# Patient Record
Sex: Female | Born: 1950 | Race: White | Hispanic: No | Marital: Married | Smoking: Never smoker
Health system: Southern US, Community
[De-identification: ages and names within clinical notes are randomized; demographics above are authoritative.]

## PROBLEM LIST (undated history)

## (undated) DIAGNOSIS — I771 Stricture of artery: Secondary | ICD-10-CM

## (undated) DIAGNOSIS — I774 Celiac artery compression syndrome: Secondary | ICD-10-CM

## (undated) DIAGNOSIS — E039 Hypothyroidism, unspecified: Secondary | ICD-10-CM

## (undated) DIAGNOSIS — M109 Gout, unspecified: Secondary | ICD-10-CM

## (undated) DIAGNOSIS — J449 Chronic obstructive pulmonary disease, unspecified: Secondary | ICD-10-CM

## (undated) DIAGNOSIS — E785 Hyperlipidemia, unspecified: Secondary | ICD-10-CM

## (undated) DIAGNOSIS — I4891 Unspecified atrial fibrillation: Secondary | ICD-10-CM

## (undated) DIAGNOSIS — M1990 Unspecified osteoarthritis, unspecified site: Secondary | ICD-10-CM

## (undated) DIAGNOSIS — B029 Zoster without complications: Secondary | ICD-10-CM

## (undated) DIAGNOSIS — I2510 Atherosclerotic heart disease of native coronary artery without angina pectoris: Secondary | ICD-10-CM

## (undated) DIAGNOSIS — I701 Atherosclerosis of renal artery: Secondary | ICD-10-CM

## (undated) DIAGNOSIS — I350 Nonrheumatic aortic (valve) stenosis: Secondary | ICD-10-CM

## (undated) DIAGNOSIS — N19 Unspecified kidney failure: Secondary | ICD-10-CM

## (undated) DIAGNOSIS — Z87442 Personal history of urinary calculi: Secondary | ICD-10-CM

## (undated) DIAGNOSIS — I499 Cardiac arrhythmia, unspecified: Secondary | ICD-10-CM

## (undated) DIAGNOSIS — R7303 Prediabetes: Secondary | ICD-10-CM

## (undated) DIAGNOSIS — I10 Essential (primary) hypertension: Secondary | ICD-10-CM

## (undated) HISTORY — PX: REPAIR KNEE LIGAMENT: SUR1188

## (undated) HISTORY — PX: PARTIAL HYSTERECTOMY: SHX80

## (undated) HISTORY — DX: Essential (primary) hypertension: I10

## (undated) HISTORY — PX: CATARACT EXTRACTION: SUR2

## (undated) HISTORY — PX: TUBAL LIGATION: SHX77

## (undated) HISTORY — DX: Prediabetes: R73.03

## (undated) HISTORY — PX: ABDOMINAL HYSTERECTOMY: SHX81

## (undated) HISTORY — DX: Stricture of artery: I77.1

## (undated) HISTORY — DX: Atherosclerosis of renal artery: I70.1

## (undated) HISTORY — DX: Hyperlipidemia, unspecified: E78.5

## (undated) HISTORY — PX: CHOLECYSTECTOMY: SHX55

## (undated) HISTORY — PX: DILATION AND CURETTAGE OF UTERUS: SHX78

## (undated) HISTORY — DX: Unspecified osteoarthritis, unspecified site: M19.90

## (undated) HISTORY — PX: ANKLE SURGERY: SHX546

## (undated) HISTORY — PX: BREAST BIOPSY: SHX20

## (undated) HISTORY — DX: Celiac artery compression syndrome: I77.4

## (undated) HISTORY — DX: Gout, unspecified: M10.9

## (undated) HISTORY — DX: Hypothyroidism, unspecified: E03.9

---

## 2000-10-19 ENCOUNTER — Encounter: Payer: Self-pay | Admitting: Internal Medicine

## 2000-10-19 ENCOUNTER — Ambulatory Visit (HOSPITAL_COMMUNITY): Admission: RE | Admit: 2000-10-19 | Discharge: 2000-10-19 | Payer: Self-pay | Admitting: Internal Medicine

## 2000-10-25 ENCOUNTER — Encounter: Payer: Self-pay | Admitting: Internal Medicine

## 2000-10-25 ENCOUNTER — Ambulatory Visit (HOSPITAL_COMMUNITY): Admission: RE | Admit: 2000-10-25 | Discharge: 2000-10-25 | Payer: Self-pay | Admitting: Internal Medicine

## 2001-01-06 ENCOUNTER — Encounter: Payer: Self-pay | Admitting: Internal Medicine

## 2001-01-06 ENCOUNTER — Ambulatory Visit (HOSPITAL_COMMUNITY): Admission: RE | Admit: 2001-01-06 | Discharge: 2001-01-06 | Payer: Self-pay | Admitting: Internal Medicine

## 2001-07-01 ENCOUNTER — Ambulatory Visit (HOSPITAL_COMMUNITY): Admission: RE | Admit: 2001-07-01 | Discharge: 2001-07-01 | Payer: Self-pay | Admitting: Internal Medicine

## 2001-07-01 ENCOUNTER — Encounter: Payer: Self-pay | Admitting: Internal Medicine

## 2001-09-29 ENCOUNTER — Encounter (HOSPITAL_COMMUNITY): Admission: RE | Admit: 2001-09-29 | Discharge: 2001-10-29 | Payer: Self-pay | Admitting: Rheumatology

## 2001-11-09 ENCOUNTER — Ambulatory Visit (HOSPITAL_COMMUNITY): Admission: RE | Admit: 2001-11-09 | Discharge: 2001-11-09 | Payer: Self-pay | Admitting: Internal Medicine

## 2001-11-15 ENCOUNTER — Ambulatory Visit (HOSPITAL_COMMUNITY): Admission: RE | Admit: 2001-11-15 | Discharge: 2001-11-15 | Payer: Self-pay | Admitting: Internal Medicine

## 2001-11-17 ENCOUNTER — Encounter: Payer: Self-pay | Admitting: Internal Medicine

## 2001-11-17 ENCOUNTER — Ambulatory Visit (HOSPITAL_COMMUNITY): Admission: RE | Admit: 2001-11-17 | Discharge: 2001-11-17 | Payer: Self-pay | Admitting: Internal Medicine

## 2004-05-27 ENCOUNTER — Ambulatory Visit (HOSPITAL_COMMUNITY): Admission: RE | Admit: 2004-05-27 | Discharge: 2004-05-27 | Payer: Self-pay | Admitting: Internal Medicine

## 2004-06-10 ENCOUNTER — Ambulatory Visit: Payer: Self-pay | Admitting: Orthopedic Surgery

## 2004-08-10 ENCOUNTER — Inpatient Hospital Stay (HOSPITAL_COMMUNITY): Admission: EM | Admit: 2004-08-10 | Discharge: 2004-08-14 | Payer: Self-pay | Admitting: Emergency Medicine

## 2004-08-10 ENCOUNTER — Ambulatory Visit: Payer: Self-pay | Admitting: Internal Medicine

## 2004-08-22 ENCOUNTER — Ambulatory Visit (HOSPITAL_COMMUNITY): Admission: RE | Admit: 2004-08-22 | Discharge: 2004-08-22 | Payer: Self-pay | Admitting: Internal Medicine

## 2004-11-26 ENCOUNTER — Other Ambulatory Visit: Admission: RE | Admit: 2004-11-26 | Discharge: 2004-11-26 | Payer: Self-pay | Admitting: Dermatology

## 2005-10-21 ENCOUNTER — Ambulatory Visit (HOSPITAL_COMMUNITY): Admission: RE | Admit: 2005-10-21 | Discharge: 2005-10-21 | Payer: Self-pay | Admitting: Family Medicine

## 2005-10-23 ENCOUNTER — Emergency Department (HOSPITAL_COMMUNITY): Admission: EM | Admit: 2005-10-23 | Discharge: 2005-10-23 | Payer: Self-pay | Admitting: Emergency Medicine

## 2005-12-17 ENCOUNTER — Encounter (HOSPITAL_COMMUNITY): Admission: RE | Admit: 2005-12-17 | Discharge: 2006-01-16 | Payer: Self-pay | Admitting: Orthopedic Surgery

## 2006-08-31 ENCOUNTER — Encounter (INDEPENDENT_AMBULATORY_CARE_PROVIDER_SITE_OTHER): Payer: Self-pay | Admitting: Specialist

## 2006-08-31 ENCOUNTER — Other Ambulatory Visit: Admission: RE | Admit: 2006-08-31 | Discharge: 2006-08-31 | Payer: Self-pay | Admitting: Internal Medicine

## 2006-09-09 ENCOUNTER — Ambulatory Visit (HOSPITAL_COMMUNITY): Admission: RE | Admit: 2006-09-09 | Discharge: 2006-09-09 | Payer: Self-pay | Admitting: Family Medicine

## 2009-03-26 ENCOUNTER — Ambulatory Visit (HOSPITAL_COMMUNITY): Admission: RE | Admit: 2009-03-26 | Discharge: 2009-03-26 | Payer: Self-pay | Admitting: Family Medicine

## 2010-05-25 ENCOUNTER — Encounter: Payer: Self-pay | Admitting: Family Medicine

## 2010-05-25 ENCOUNTER — Encounter: Payer: Self-pay | Admitting: Internal Medicine

## 2010-09-20 NOTE — Discharge Summary (Signed)
Kathleen Vance, Kathleen Vance             ACCOUNT NO.:  [id number]   MEDICAL RECORD NO.:  [id number]          PATIENT TYPE:  INP   LOCATION:  A307                          FACILITY:  [facility]   PHYSICIAN:  Bonne Dolores, M.D.    DATE OF BIRTH:  October 24, 1950   DATE OF ADMISSION:  08/10/2004  DATE OF DISCHARGE:  04/12/2006LH                                 DISCHARGE SUMMARY   DISCHARGE DIAGNOSIS:  Fever of unknown origin, probably viral  cytomegalovirus titer (IgM) is positive at one 1.75.  Afebrile x48 hours.  Workup benign.  Echocardiogram pending.   HISTORY OF PRESENT ILLNESS:  For details regarding the admission note.  Briefly, this 60 year old female, with a history of migraines, hypertension,  and status post cholecystectomy, as well as chronic urinary tract  infections, developed an episode of diaphoresis, weakness, and fever.  She  has been treated for recurrent urinary tract infections and has been on  several antibiotics intermittently since.  She has continued to have fever,  left lower quadrant abdominal flank pain, and dry cough.  She had had no  significant headache, nausea and vomiting, or diarrhea (except mild  associated with antibiotics), melena, hematemesis, hematochezia, or full  blown genitourinary symptoms.  In the emergency department she was found to  be febrile with a temperature of 103 degrees.  Hemodynamics were stable.  Laboratories revealed mild pyuria and a white count of 10,000, a clear chest  x-ray, and a CT of the abdomen was essentially unrevealing.   She was admitted with a fever of unknown origin associated with pyuria and  mild diarrhea only.  Of note, cultures of the urine were obtained in the  office the week prior, which were positive for E. coli and sensitive to all  antibiotics.   COURSE IN THE HOSPITAL:  The patient was treated empirically with Rocephin.  Blood cultures were obtained, which were negative.  Viral syndrome was  suspected and CMV titers  were obtained, which were positive with an IgM at  1.75, indicative of ongoing or infection.   Dr. Laural Golden saw the patient, who considered a colonoscopy.  However, her GI  symptoms have improved, she has defervesced, and has been afebrile for 36  hours.  She has been treated with Doxycycline to cover atypicals.   Currently the patient is stable and ready for discharge.  Please note, a  monospot, ANA, and Lyme titers were all negative.   DISCHARGE MEDICATIONS:  Include:  1.  Doxycycline 100 mg b.i.d.  She is to continue:  1.  Diovan.  2.  Neurontin.  3.  Levoxyl at 50 mcg daily.   FOLLOW UP:  She will be followed through expectantly as an outpatient.      MC/MEDQ  D:  08/14/2004  T:  08/14/2004  Job:  [id number]

## 2010-09-20 NOTE — Procedures (Signed)
[name], [name] NO.:  [id number]   MEDICAL RECORD NO.:  [id number]            PATIENT TYPE:   LOCATION:                                 FACILITY:   PHYSICIAN:  Shelva Majestic, M.D.          DATE OF BIRTH:   DATE OF PROCEDURE:  08/13/2004  DATE OF DISCHARGE:                                  ECHOCARDIOGRAM   PROCEDURE:  2-D echo Doppler study.   INDICATIONS:  Ms. Kathleen Vance is a 60 year old patient of Dr. Jari Sportsman  who has a history of hypertension, fever, and pyelonephritis. This study is  performed to evaluate left ventricular function and for hypertensive  cardiovascular disease.   FINDINGS:  1.  Technically, this is an adequate M-mode 2-dimensional and comprehensive      echo Doppler study.  2.  He has mild concentric left ventricular hypertrophy with septal      thickness being upper normal at 1.1 cm and the posterior wall thickness      being 1.2 cm.  Left ventricular end-diastolic and then systolic      dimensions were normal at 4.7 and 2.5 cm, respectively. Left ventricular      systolic function is normal with an estimated ejection fraction at least      55%.  Diastolic primaries appeared normal.  3.  Left atrial dimension was minimally increased at 4.1 cm.  Right atrium      was upper normal in size.  Right ventricle size and function were      normal.  4.  The aortic root dimension was normal at 3.0 cm.  5.  The aortic valve was trileaflet.  There was minimal aortic valvular      sclerosis without stenosis.  There was no aortic regurgitation.  6.  There was mild mitral annular calcification.  There was trivial mitral      regurgitation.  7.  Tricuspid valve appeared structurally normal.  There was a trivial      tricuspid regurgitation.  8.  Pulmonic valve was not well visualized.  9.  There was a very minimal, trivial posterior upper normal to slight      pericardial effusion.  In addition, there was mild apical fat pad versus  minimal apical effusion.  There was no evidence for tamponade      physiology.   IMPRESSION:  1.  Technically this is an adequate echo Doppler study demonstrating a mild      concentric left ventricular hypertrophy with normal left ventricular      systolic and diastolic function.  2.  There is mild aortic valve sclerosis without stenosis.  3.  Mild mitral annular calcification with trivial mitral regurgitation and      tricuspid regurgitation.  There was mild left atrial enlargement.  4.  There is a trivial, posterior, pericardial effusion and apical fat pad      versus minimal apical fusion.      TK/MEDQ  D:  08/13/2004  T:  08/13/2004  Job:  [id number]   cc:   Bonne Dolores, M.D.  [phone]  [address]  [place] [address]  Fax: [phone]

## 2010-09-20 NOTE — Consult Note (Signed)
NAMESHAWNISE, PULSIPHER             ACCOUNT NO.:  [id number]   MEDICAL RECORD NO.:  [id number]          PATIENT TYPE:  INP   LOCATION:  A307                          FACILITY:  [facility]   PHYSICIAN:  Hildred Laser, M.D.    DATE OF BIRTH:  Oct 03, 1950   DATE OF CONSULTATION:  08/12/2004  DATE OF DISCHARGE:                                   CONSULTATION   REQUESTING PHYSICIAN:  Dr. Caron Presume.   REASON FOR CONSULTATION:  Febrile illness, abdominal pain.   HISTORY OF PRESENT ILLNESS:  Ms. Lachman is a 60 year old Caucasian female  who was admitted on August 10, 2004 with almost one month history of high  fever, cough, headache, weakness and some diarrhea.  She was suspected to  have pyelonephritis.  She did have a positive urinalysis and most recent  urine culture has been negative.  She has been on multiple antibiotics for  recent UTIs.  She notes around March 12 she was vacationing in [place] and  she developed severe diaphoresis, fatigue and weakness.  She then developed  a high fever.  About six days later, she went to her primary care physician  and was diagnosed with a urinary tract infection.  She was given a shot of  Rocephin she believe and started on Keflex.  She continued to have severe  weakness.  She noted some abdominal bloating and some nausea without any  emesis.  She began to have watery stools, anywhere from two to six per days.  She also noted some intermittent hematochezia which she describes as bright  red blood.  She denies any mucus.  She denies any problems with  constipation.  She did vomit once and she was admitted, although she feels  this is related to the pain medicine.  On exam by Dr. Caron Presume, she was  found to have some left lower quadrant tenderness.  She has had abdominal  pelvic CT which revealed some descending sigmoid colon diverticulosis and  some small nonspecific lymph nodes.  She also had a CT scan of her chest  which revealed a few nonspecific nodes and  some left lower lobe subsegmental  atelectasis.  She continues to complain of anorexia, high fevers and severe  weakness.  Her T max since admission is 102.2 this morning.  She denies any  GI history, although does describe a history of being hospitalized many  years ago for possible colitis or diverticulitis.  She does not remember.   PAST MEDICAL HISTORY:  1.  Migraine headaches.  2.  Hypertension with left renal artery stenosis.  3.  Chronic urinary tract infections.  4.  Bulging disk which she has been seen by a neurosurgeon at [facility].  5.  Status post cholecystectomy and partial hysterectomy.  6.  Right knee arthroscopy.  7.  Left facet surgery.  8.  Gout.  9.  Hypothyroidism.   MEDICATIONS PRIOR TO ADMISSION:  1.  Diovan HCT 160/125 mg daily.  2.  Levoxyl 50 mcg daily.  3.  Neurontin 300 mg b.i.d. and 600 mg q.h.s.  4.  Lasix 40 mg daily.  5.  Keflex.  6.  Macrodantin.   ALLERGIES:  She states that Cipro and erythromycin and another unknown  antibiotic caused oral blisters, nausea and vomiting in the past.   FAMILY HISTORY:  Mother deceased at age 89 secondary to coronary artery  disease.  Father deceased at age 8 secondary to leukemia.  She has multiple  siblings with history significant for renal failure.  No known family  history of colorectal carcinoma or inflammatory bowel disease.   SOCIAL HISTORY:  Ms. Sula is attempting to obtain disability.  She is in  her second marriage and has been married for nine years.  She has two grown  healthy sons.  She denies any tobacco, alcohol or drug use.   REVIEW OF SYSTEMS:  CONSTITUTIONAL:  Weight has been relatively stable.  She  is complaining of chills and night sweats.  CARDIOVASCULAR:  She denies any  chest pain or palpitations.  PULMONARY:  She reports a nonproductive cough.  Denies any dyspnea or hemoptysis.  ENDOCRINE:  Does have history of  hypothyroidism.  MUSCULOSKELETAL:  She is complaining of left leg  numbness.  She has had chronic low back pain with some bulging disks she believes to  be L3, 4 and 5.  Lots of lower extremity edema and lower extremity pain.  GI:  See HPI.  She denies any heartburn, indigestion, dysphagia or  odynophagia.   PHYSICAL EXAMINATION:  VITAL SIGNS:  Temperature 102.2, pulse 100,  respirations 16, blood pressure 137/96, height 64 inches, weight 214.5  pounds.  GENERAL:  Ms. Feliciano is a 60 year old, obese Caucasian female, who is  alert, oriented, pleasant, cooperative, in no acute distress.  SKIN:  She does have facial flushing and multiple raised brown macules.  No  rash or jaundice.  HEENT:  Sclerae clear, nonicteric.  Oropharynx pink and moist without any  lesions.  NECK:  Supple with no masses or thyromegaly.  CHEST:  Heart regular rate and rhythm.  Normal S1, S2, without any murmurs,  rubs, clicks or gallops.  LUNGS:  Clear to auscultation bilaterally.  ABDOMEN:  Obese, with positive bowel sounds x4.  No bruits auscultated.  Soft, nontender, nondistended.  She does have mild left lower quadrant  tenderness on deep palpation.  Unable to palpate mass or hepatosplenomegaly,  although exam is limited given patient's body habitus.  EXTREMITIES:  Trace lower extremity edema bilaterally.   LABORATORY STUDIES:  WBC 8.4, hemoglobin 10.6, hematocrit 30.9, platelets  252.  Admission hemoglobin is 12.3.  Sed rate elevated at 45.  Calcium 7.3,  sodium 134, potassium 3.9, chloride 102, CO2 26, BUN 10, creatinine 1.4,  glucose 112.  Total bilirubin 1.1, alkaline-phosphatase 57, SGOT 42, SGPT  25, total protein 7, albumin 3.1.  Urinalysis is positive, but urine culture  without growth.  Blood cultures without growth x24 hours.  Strep A is less  than 25.   IMPRESSION:  Ms. Laday is a 60 year old Caucasian female with a one month  history of febrile illness, weakness, fatigue, anorexia, diarrhea, occasional intermittent hematochezia and general malaise.  She has  had  recent urinary tract infections and has been on multiple antibiotics for  this.  Most recent urine cultures in the hospital were negative, although  she was already on antibiotics.  She has had CT of the abdomen, pelvis and  chest which was noncontributory except for extensive diverticulosis and a  few nonspecific lymph nodes.  I would suspect that she has a viral illness  and needs to rule out mononucleosis/CMV/UBV.  Possibly, she could also have  C. difficile given her recent antibiotics for urinary tract infections.  Most likely it is occult diverticulitis or occult malignancy.   RECOMMENDATIONS:  1.  We will check a TSH, monospot, stool for C. difficile, WBC, ova and      parasite and stool culture and sensitivity.  2.  Anemia profile, Lyme titer, CMV and AMA are pending.  3.  We will consider colonoscopy in the near future, possibly tomorrow by      Dr. Melony Overly.   I would like to thank Dr. Caron Presume for allowing [place] to participate in the  care of Ms. Schwer.      KC/MEDQ  D:  08/12/2004  T:  08/12/2004  Job:  [id number]   cc:   Bonne Dolores, M.D.  [address], [place] [address]  Fax: [phone]

## 2010-09-20 NOTE — H&P (Signed)
Kathleen Vance, Kathleen Vance             ACCOUNT NO.:  [id number]   MEDICAL RECORD NO.:  [id number]          PATIENT TYPE:  INP   LOCATION:  A307                          FACILITY:  [facility]   PHYSICIAN:  Bonne Dolores, M.D.    DATE OF BIRTH:  03/18/1951   DATE OF ADMISSION:  08/10/2004  DATE OF DISCHARGE:  LH                                HISTORY & PHYSICAL   CHIEF COMPLAINT:  Headache, fever, cough.   HISTORY OF PRESENT ILLNESS:  This is a 60 year old female with a history of  migraine headache and hypertension and is status post cholecystectomy.  She  also has chronic urinary tract infections, has been treated multiple times  in the past.  Approximately one month ago, the patient developed an episode  of diaphoresis and extreme weakness and fever.  Urinary tract infection has  been diagnosed and she has been on several antibiotics intermittently since.  She has continued to have intermittent fever, left lower quadrant abdominal  and flank pain.  Also associated with this syndrome has been a dry cough.  She has had no headache, nausea, vomiting, or significant diarrhea, melena,  hematemesis, or hematochezia, or full-blown genitourinary symptoms.   The patient presented to the emergency department the day of admission with  ongoing symptoms as described above.  She was found to be febrile with a  temperature of 103 degrees.  Hemodynamics were stable.  Laboratory workup  revealed the presence of mild pyuria, a white count of 10,000, and a clear  chest x-ray as well as CT which was essentially unrevealing.   The patient is admitted with fever of unknown origin associated with pyuria  only.  Of note, cultures obtained in the office by Dr. Gerarda Fraction are  unavailable.  However, she has been on Macrodantin as well as Keflex for the  past week or so.   REVIEW OF SYSTEMS:  Otherwise, negative.  She has had no photophobia, neck  pain, chest pain, shortness of breath, or other significant symptoms.   She  has had a cough as noted which has been nonproductive.  She denies  hemoptysis as well.   CURRENT MEDICATIONS:  Macrodantin and Keflex.  Diovan HCT, Neurontin 300 mg  b.i.d. and 600 mg q.h.s.  Levoxyl (unknown dose 1 daily).   PAST MEDICAL HISTORY:  As noted, also is hypothyroid on replacement therapy.   SOCIAL HISTORY:  Nonsmoker and nondrinker.  She does some traveling but has  not been to the [place].  They spent some time in [place] and [place] as  well as [place] within the past year.  She denies any tick exposure within  the past two years.   FAMILY HISTORY:  Noncontributory.   PHYSICAL EXAMINATION:  A very pleasant, mildly obese female who is alert and  oriented and in no acute distress.  Current vital signs:  Temperature is 100  degrees, blood pressure 117/74, pulse is 84, respirations 20.  HEENT:  Normocephalic, atraumatic.  Pupils are equal.  Ears, nose, and throat are  benign.  Neck is supple without thyromegaly, lymphadenopathy, or bruits.  Lungs are clear.  Heart  sounds are perfectly normal with no murmurs, rubs,  or gallops.  The abdomen reveals some tenderness in the left lower quadrant  and left upper quadrant region.  There is no guarding present.  No masses  are palpable.  There is no rebound tenderness.  Bowel sounds are normal.  Extremities:  No cyanosis, clubbing, or edema.  Neurologic exam is within  normal limits.   LABORATORY DATA:  Pertinent labs as noted.  Repeat labs on April 9 reveals a  white count of 8400 with a normal differential.  Hemoglobin 10.6, hematocrit  30.9.  Med-7 normal except for glucose of 112.  Cultures thus far negative.   ASSESSMENT:  Fever of questionable etiology.  She has had some cough and  pyuria associated with left flank and upper abdominal pain.  CT negative for  diverticulitis and white count normal.  Possible viral syndrome,  inflammatory disorder, tick-borne illness, or occult malignancy.   PLAN:  We will obtain  febrile gluten Lyme titer, rheumatologic screen, and  CT scan of the chest, and continue Rocephin 1 g IV q.24h. pending cultures.  We will follow and treat expectantly.  Note:  Anemia is present and may  require further GI evaluation.  We will follow and tract expectantly.      MC/MEDQ  D:  08/11/2004  T:  08/11/2004  Job:  [id number]

## 2016-05-29 ENCOUNTER — Other Ambulatory Visit (HOSPITAL_COMMUNITY): Payer: Self-pay | Admitting: Family Medicine

## 2016-05-29 DIAGNOSIS — I10 Essential (primary) hypertension: Secondary | ICD-10-CM

## 2016-06-03 ENCOUNTER — Ambulatory Visit (HOSPITAL_COMMUNITY)
Admission: RE | Admit: 2016-06-03 | Discharge: 2016-06-03 | Disposition: A | Discharge status: Home / Self Care | Payer: Medicare Other | Source: Ambulatory Visit | Attending: Family Medicine | Admitting: Family Medicine

## 2016-06-03 DIAGNOSIS — I774 Celiac artery compression syndrome: Secondary | ICD-10-CM | POA: Insufficient documentation

## 2016-06-03 DIAGNOSIS — I701 Atherosclerosis of renal artery: Secondary | ICD-10-CM | POA: Diagnosis present

## 2016-06-03 DIAGNOSIS — I10 Essential (primary) hypertension: Secondary | ICD-10-CM | POA: Insufficient documentation

## 2016-06-03 NOTE — Progress Notes (Signed)
*  PRELIMINARY RESULTS* Vascular Ultrasound Renal Artery Duplex has been completed.  Preliminary findings: Technically limited study due to overlying bowel gas and body habitus. Appears to be no stenosis of Right renal artery. Appears to be 1-59% stenosis of the Left renal artery, upper end of scale.     Landry Mellow, RDMS, RVT  06/03/2016, 9:23 AM

## 2016-06-30 ENCOUNTER — Encounter: Payer: Self-pay | Admitting: Vascular Surgery

## 2016-07-01 ENCOUNTER — Encounter: Payer: Self-pay | Admitting: Vascular Surgery

## 2016-07-04 ENCOUNTER — Encounter: Payer: Self-pay | Admitting: Vascular Surgery

## 2016-07-11 ENCOUNTER — Encounter: Payer: Self-pay | Admitting: Vascular Surgery

## 2016-07-11 ENCOUNTER — Ambulatory Visit (INDEPENDENT_AMBULATORY_CARE_PROVIDER_SITE_OTHER): Payer: Medicare Other | Admitting: Vascular Surgery

## 2016-07-11 VITALS — BP 98/64 | HR 60 | Temp 98.1°F | Resp 20 | Ht 64.0 in | Wt 215.0 lb

## 2016-07-11 DIAGNOSIS — I701 Atherosclerosis of renal artery: Secondary | ICD-10-CM

## 2016-07-11 DIAGNOSIS — I10 Essential (primary) hypertension: Secondary | ICD-10-CM

## 2016-07-11 NOTE — Progress Notes (Signed)
Patient ID: SKI POLICH, female   DOB: [birth year], 66 y.o.   MRN: [id number]  Reason for Consult: New Evaluation (Renal and celiac stenosis,  Pt has malignant hypertension)   Referred by No ref. provider found  Subjective:     HPI:  Kathleen Vance is a 66 y.o. female with history of hypertension and hyperlipidemia as well as now been diagnosed with diabetes. She is sent for evaluation of celiac artery stenosis as well as renal artery stenosis. From celiac standpoint she does not complain of any belly pain recent weight loss or pain with eating. There is also no mention of SMA stenosis on her evaluation. From a renal artery stenosis standpoint she has not had any pulmonary edema although she does have some fluid overload and possibly has CHF. She does have hypertension currently taking 2 medications for that. She is not aware of any renal insufficiency at this time and I do not have any numbers to suggest that she does have clinical renal insufficiency. Walking standpoint she says that the main limitation is her gout flares but otherwise she has not limiting with any claudication. She does not have rest pain or tissue loss. She also has never had a stroke TIA or amaurosis and suggest carotid artery disease.  Past Medical History:  Diagnosis Date  . Arthritis   . 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 Gout   . Hyperlipidemia   . Hypertension   . Hypothyroidism   . Prediabetes   . Renal artery stenosis ([facility])    No family history on file. Past Surgical History:  Procedure Laterality Date  . ANKLE SURGERY Bilateral   . CATARACT EXTRACTION    . CESAREAN SECTION    . CHOLECYSTECTOMY    . PARTIAL HYSTERECTOMY    . REPAIR KNEE LIGAMENT      Short Social History:  Social History  Substance Use Topics  . Smoking status: Never Smoker  . Smokeless tobacco: Never Used  . Alcohol use Yes     Comment: rarely    Allergies  Allergen Reactions  . Ciprofloxacin Anaphylaxis  .  Nitrofurantoin Anaphylaxis  . Metronidazole     "sick"  . Amoxicillin Rash  . Erythromycin Rash    Current Outpatient Prescriptions  Medication Sig Dispense Refill  . aspirin 81 MG tablet Take 81 mg by mouth daily.    . furosemide (LASIX) 40 MG tablet Take 20 mg by mouth daily.     Marland Kitchen gabapentin (NEURONTIN) 300 MG capsule Take 300 mg by mouth 2 (two) times daily.     . hydrALAZINE (APRESOLINE) 10 MG tablet Take 10 mg by mouth 2 (two) times daily.    Marland Kitchen labetalol (NORMODYNE) 200 MG tablet Take 200 mg by mouth 4 (four) times daily.     Marland Kitchen levothyroxine (SYNTHROID, LEVOTHROID) 100 MCG tablet Take 100 mcg by mouth daily before breakfast.    . MethylPREDNISolone (MEDROL, PAK, PO) Take by mouth.    . oxyCODONE-acetaminophen (PERCOCET) 5-325 MG tablet Take by mouth every 6 (six) hours as needed for severe pain.    . pravastatin (PRAVACHOL) 20 MG tablet Take 20 mg by mouth daily.     No current facility-administered medications for this visit.     Review of Systems  Constitutional: Positive for fatigue.  HENT: HENT negative.  Eyes: Eyes negative.  Respiratory: Positive for shortness of breath.  Cardiovascular: Positive for leg swelling.  GI: Gastrointestinal negative.  Musculoskeletal: Musculoskeletal negative.  Skin: Skin negative.  Neurological: Neurological negative. Hematologic: Hematologic/lymphatic negative.  Psychiatric: Psychiatric negative.        Objective:  Objective   Vitals:   07/11/16 1131 07/11/16 1132  BP: 110/64 98/64  Pulse: 60 60  Resp: 20   Temp: 98.1 F (36.7 C)   TempSrc: Oral   SpO2: 95%   Weight: 215 lb (97.5 kg)   Height: 5\' 4"  (1.626 m)    Body mass index is 36.9 kg/m.  Physical Exam  Constitutional: She is oriented to person, place, and time. She appears well-developed.  HENT:  Head: Normocephalic.  Eyes: Pupils are equal, round, and reactive to light.  Neck: Normal range of motion.  Cardiovascular: Normal rate.   Pulses:      Carotid  pulses are 2+ on the right side, and 2+ on the left side.      Radial pulses are 2+ on the right side, and 2+ on the left side.       Femoral pulses are 2+ on the right side, and 2+ on the left side. Pulmonary/Chest: Effort normal.  Abdominal: Soft. She exhibits no mass.  Musculoskeletal: Normal range of motion. She exhibits no edema.  Neurological: She is alert and oriented to person, place, and time. No cranial nerve deficit.  Skin: Skin is warm and dry.  Psychiatric: She has a normal mood and affect. Her behavior is normal. Judgment and thought content normal.    Data:      Assessment/Plan:     Cc-year-old female here for evaluation of celiac artery and renal artery stenosis. From a mesenteric ischemia standpoint she seems asymptomatic. No recent weight loss or pain with eating. From renal artery stenosis standpoint she does have 1-59% left renal artery stenosis with a peak systolic velocity that is less than 250. She does have elevated resistive indices in her bilateral kidneys which are also somewhat small at 9 cm suggesting that she has medical renal disease. At this time I would not recommend renal artery stenting to her given the absence of flash pulmonary edema and worsening renal insufficiency or stenosis to suggest the cause of her hypertension. We will have her follow-up in 1 year with repeat renal duplex.     Kathleen Sandy MD [facility]

## 2016-07-17 NOTE — Addendum Note (Signed)
Addended by: Lianne Cure A on: 07/17/2016 09:55 AM   Modules accepted: Orders

## 2016-08-22 ENCOUNTER — Ambulatory Visit (INDEPENDENT_AMBULATORY_CARE_PROVIDER_SITE_OTHER): Payer: Medicare Other | Admitting: Cardiovascular Disease

## 2016-08-22 ENCOUNTER — Encounter: Payer: Self-pay | Admitting: Cardiovascular Disease

## 2016-08-22 VITALS — BP 167/90 | HR 59 | Ht 64.0 in | Wt 221.0 lb

## 2016-08-22 DIAGNOSIS — I5032 Chronic diastolic (congestive) heart failure: Secondary | ICD-10-CM

## 2016-08-22 DIAGNOSIS — I350 Nonrheumatic aortic (valve) stenosis: Secondary | ICD-10-CM | POA: Diagnosis not present

## 2016-08-22 DIAGNOSIS — I10 Essential (primary) hypertension: Secondary | ICD-10-CM

## 2016-08-22 NOTE — Patient Instructions (Signed)
Medication Instructions:  Your physician recommends that you continue on your current medications as directed. Please refer to the Current Medication list given to you today.  Labwork: NONE  Testing/Procedures: NONE  Follow-Up: Your physician wants you to follow-up in: 6 MONTHS WITH DR. KONESWARAN. You will receive a reminder letter in the mail two months in advance. If you don't receive a letter, please call our office to schedule the follow-up appointment.  Any Other Special Instructions Will Be Listed Below (If Applicable).  If you need a refill on your cardiac medications before your next appointment, please call your pharmacy. 

## 2016-08-22 NOTE — Progress Notes (Signed)
CARDIOLOGY CONSULT NOTE  Patient ID: Kathleen Vance MRN: [id number] DOB/AGE: [birth year] 66 y.o.  Admit date: (Not on file) Primary Physician: Sandi Mealy, MD Referring Physician: Lorra Hals  Reason for Consultation: malignant hypertension and valvular heart disease  HPI: Kathleen Vance is a 66 y.o. female who is being seen today for the evaluation of malignant hypertension and valvular heart disease at the request of Sandi Mealy, MD.   She was recently evaluated by vascular surgery for celiac and renal artery stenosis. Renal Dopplers are going to be repeated in a year. She has 1-59% left renal artery stenosis.  Echocardiogram in 06/2016 at an outside facility showed normal LV systolic function, EF 26-20%, grade 3 diastolic dysfunction, and moderate aortic stenosis (although mean gradient reported as 10 mmHg).   Nuclear stress test 10/15/15 showed no ischemia or scar.  ECG performed in the office today which I ordered and personally interpreted demonstrates normal sinus rhythm with LVH and repolarization abnormalities.  She is seeing a nephrologist (Dr. Mercy Moore with [facility]) in [place] for BP management.  She denies chest pain and shortness of breath as well as orthopnea, leg swelling, and PND.  Says Lasix 20 mg daily has helped to keep leg edema controlled.  She keeps a detailed BP log and I reviewed these readings, with several SBP's > 200 mmHg.  When BP got down to 115/72, she became lightheaded and dizzy.  Was noticed to have malignant HTN in 06/2016.    Allergies  Allergen Reactions  . Ciprofloxacin Anaphylaxis  . Nitrofurantoin Anaphylaxis  . Metronidazole     "sick"  . Amoxicillin Rash  . Erythromycin Rash    Current Outpatient Prescriptions  Medication Sig Dispense Refill  . aspirin 81 MG tablet Take 81 mg by mouth daily.    . furosemide (LASIX) 40 MG tablet Take 20 mg by mouth daily.     Marland Kitchen gabapentin (NEURONTIN) 300  MG capsule Take 300 mg by mouth 2 (two) times daily.     . hydrALAZINE (APRESOLINE) 10 MG tablet Take 10 mg by mouth 2 (two) times daily.    Marland Kitchen labetalol (NORMODYNE) 200 MG tablet Take 400 mg by mouth 2 (two) times daily.     Marland Kitchen levothyroxine (SYNTHROID, LEVOTHROID) 100 MCG tablet Take 100 mcg by mouth daily before breakfast.    . MethylPREDNISolone (MEDROL, PAK, PO) Take 1 tablet by mouth as needed.     Marland Kitchen oxyCODONE-acetaminophen (PERCOCET) 5-325 MG tablet Take 1 tablet by mouth every 6 (six) hours as needed for severe pain.     . pravastatin (PRAVACHOL) 20 MG tablet Take 20 mg by mouth daily.     No current facility-administered medications for this visit.     Past Medical History:  Diagnosis Date  . Arthritis   . Celiac artery stenosis ([place])   . Gout   . Hyperlipidemia   . Hypertension   . Hypothyroidism   . Prediabetes   . Renal artery stenosis [facility])     Past Surgical History:  Procedure Laterality Date  . ANKLE SURGERY Bilateral   . CATARACT EXTRACTION    . CESAREAN SECTION    . CHOLECYSTECTOMY    . PARTIAL HYSTERECTOMY    . REPAIR KNEE LIGAMENT      Social History   Social History  . Marital status: Married    Spouse name: N/A  . Number of children: N/A  . Years of education: N/A   Occupational History  .  Not on file.   Social History Main Topics  . Smoking status: Never Smoker  . Smokeless tobacco: Never Used  . Alcohol use Yes     Comment: rarely  . Drug use: Unknown  . Sexual activity: Not on file   Other Topics Concern  . Not on file   Social History Narrative  . No narrative on file     No family history of premature CAD in 1st degree relatives.  Current Meds  Medication Sig  . aspirin 81 MG tablet Take 81 mg by mouth daily.  . furosemide (LASIX) 40 MG tablet Take 20 mg by mouth daily.   Marland Kitchen gabapentin (NEURONTIN) 300 MG capsule Take 300 mg by mouth 2 (two) times daily.   . hydrALAZINE (APRESOLINE) 10 MG tablet Take 10 mg by mouth 2 (two) times  daily.  Marland Kitchen labetalol (NORMODYNE) 200 MG tablet Take 400 mg by mouth 2 (two) times daily.   Marland Kitchen levothyroxine (SYNTHROID, LEVOTHROID) 100 MCG tablet Take 100 mcg by mouth daily before breakfast.  . MethylPREDNISolone (MEDROL, PAK, PO) Take 1 tablet by mouth as needed.   Marland Kitchen oxyCODONE-acetaminophen (PERCOCET) 5-325 MG tablet Take 1 tablet by mouth every 6 (six) hours as needed for severe pain.   . pravastatin (PRAVACHOL) 20 MG tablet Take 20 mg by mouth daily.      Review of systems complete and found to be negative unless listed above in HPI    Physical exam Blood pressure (!) 167/90, pulse (!) 59, height 5\' 4"  (1.626 m), weight 221 lb (100.2 kg). General: NAD Neck: No JVD, no thyromegaly or thyroid nodule.  Lungs: Clear to auscultation bilaterally with normal respiratory effort. CV: Nondisplaced PMI. Regular rate and rhythm, normal S1/S2, no E3/O1, 1/6 systolic murmur over RUSB.  No peripheral edema.  No carotid bruit.    Abdomen: Soft, nontender, obese, no distention.  Skin: Intact without lesions or rashes.  Neurologic: Alert and oriented x 3.  Psych: Normal affect. Extremities: No clubbing or cyanosis.  HEENT: Normal.   ECG: Most recent ECG reviewed.   Labs: No results found for: K, BUN, CREATININE, ALT, TSH, HGB   Lipids: No results found for: LDLCALC, LDLDIRECT, CHOL, TRIG, HDL      ASSESSMENT AND PLAN:  1. Aortic stenosis: Appears mild to moderate with mean gradient of 10 mmHg. Will repeat echocardiogram in 1.5-2 years.  2. Chronic diastolic heart failure: Aim to control BP. Euvolemic on Lasix.  3. Malignant HTN: Remains elevated. She is seeing a nephrologist (Dr. Mercy Moore with [facility]) in [place] for BP management. Warned about dangers of MI and CVA. Currently on labetalol and low dose hydralazine.  Disposition: Follow up in 6 months  Signed: Kate Sable, M.D., F.A.C.C.  08/22/2016, 3:21 PM

## 2016-09-11 ENCOUNTER — Encounter: Payer: Self-pay | Admitting: Cardiovascular Disease

## 2016-09-11 ENCOUNTER — Encounter: Payer: Self-pay | Admitting: *Deleted

## 2016-09-11 ENCOUNTER — Ambulatory Visit (INDEPENDENT_AMBULATORY_CARE_PROVIDER_SITE_OTHER): Payer: Medicare Other | Admitting: Cardiovascular Disease

## 2016-09-11 VITALS — BP 144/100 | HR 59 | Ht 64.0 in | Wt 224.0 lb

## 2016-09-11 DIAGNOSIS — R05 Cough: Secondary | ICD-10-CM | POA: Diagnosis not present

## 2016-09-11 DIAGNOSIS — J988 Other specified respiratory disorders: Secondary | ICD-10-CM | POA: Diagnosis not present

## 2016-09-11 DIAGNOSIS — B9789 Other viral agents as the cause of diseases classified elsewhere: Secondary | ICD-10-CM

## 2016-09-11 DIAGNOSIS — I5033 Acute on chronic diastolic (congestive) heart failure: Secondary | ICD-10-CM

## 2016-09-11 DIAGNOSIS — Z9289 Personal history of other medical treatment: Secondary | ICD-10-CM

## 2016-09-11 DIAGNOSIS — I350 Nonrheumatic aortic (valve) stenosis: Secondary | ICD-10-CM | POA: Diagnosis not present

## 2016-09-11 DIAGNOSIS — R059 Cough, unspecified: Secondary | ICD-10-CM

## 2016-09-11 DIAGNOSIS — I10 Essential (primary) hypertension: Secondary | ICD-10-CM

## 2016-09-11 MED ORDER — PREDNISONE 5 MG PO TABS: 5.0000 mg | ORAL_TABLET | Freq: Every day | ORAL | 0 refills | Status: DC

## 2016-09-11 MED ORDER — HYDRALAZINE HCL 25 MG PO TABS: 25.0000 mg | ORAL_TABLET | Freq: Three times a day (TID) | ORAL | 6 refills | Status: DC

## 2016-09-11 MED ORDER — FUROSEMIDE 40 MG PO TABS: 40.0000 mg | ORAL_TABLET | Freq: Two times a day (BID) | ORAL | 6 refills | Status: DC

## 2016-09-11 NOTE — Progress Notes (Signed)
SUBJECTIVE: The patient presents for premature follow-up. I saw her on 08/22/16. She has mild to moderate aortic stenosis, chronic diastolic heart failure, and malignant hypertension. She also has renal artery stenosis.  She was apparently recently evaluated at an urgent care in [place], [place] on 09/03/16 and was given a diagnosis of pneumonia and congestive heart failure.   I personally reviewed all relevant documentation, labs, studies.   I personally interpreted an ECG tracing which demonstrates sinus rhythm with LVH and repolarization abnormalities and undulating artifact.   White blood cells 10.8, hemoglobin 11.1, platelets 222, sodium 142, potassium 3.7, BUN 20, creatinine 1.8, BNP 138, troponin was normal, d-dimer mildly elevated at 0.74.   She was given ceftriaxone, cefuroxime, and doxycycline.   Blood pressure 165/94, oxygen saturations 95% on room air.   She was told to follow-up with cardiology.  Weight is up today 3 pounds from 4/20.  She was told by urgent care that she had a heart related problem. She tells me she had a clear chest x-ray and a "normal lung scan "at [facility]. She was started on antibiotics on May 2. She has been having fevers, cough, and white mucus production. She said her fevers of gone as high as 101.4. She was evaluated at this urgent care for 3 consecutive days. She was told to increase her Lasix to 40 mg and she did so for 3 days. She has difficulty sleeping at night. She said her legs are a bit more swollen.   Review of Systems: As per "subjective", otherwise negative.  Allergies  Allergen Reactions  . Ciprofloxacin Anaphylaxis  . Nitrofurantoin Anaphylaxis  . Metronidazole     "sick"  . Amoxicillin Rash  . Erythromycin Rash    Current Outpatient Prescriptions  Medication Sig Dispense Refill  . aspirin 81 MG tablet Take 81 mg by mouth daily.    . cefUROXime (CEFTIN) 500 MG tablet Take 500 mg by mouth 2 (two) times daily  with a meal.    . doxycycline (VIBRA-TABS) 100 MG tablet Take 100 mg by mouth 2 (two) times daily.    . furosemide (LASIX) 40 MG tablet Take 20 mg by mouth daily.     Marland Kitchen gabapentin (NEURONTIN) 300 MG capsule Take 300 mg by mouth 2 (two) times daily.     . hydrALAZINE (APRESOLINE) 10 MG tablet Take 10 mg by mouth 2 (two) times daily.    Marland Kitchen labetalol (NORMODYNE) 200 MG tablet Take 400 mg by mouth 2 (two) times daily.     Marland Kitchen levothyroxine (SYNTHROID, LEVOTHROID) 100 MCG tablet Take 100 mcg by mouth daily before breakfast.    . MethylPREDNISolone (MEDROL, PAK, PO) Take 1 tablet by mouth as needed.     Marland Kitchen oxyCODONE-acetaminophen (PERCOCET) 5-325 MG tablet Take 1 tablet by mouth every 6 (six) hours as needed for severe pain.     . pravastatin (PRAVACHOL) 20 MG tablet Take 20 mg by mouth daily.     No current facility-administered medications for this visit.     Past Medical History:  Diagnosis Date  . Arthritis   . Celiac artery stenosis ([place])   . Gout   . Hyperlipidemia   . Hypertension   . Hypothyroidism   . Prediabetes   . Renal artery stenosis [facility])     Past Surgical History:  Procedure Laterality Date  . ANKLE SURGERY Bilateral   . CATARACT EXTRACTION    . CESAREAN SECTION    . CHOLECYSTECTOMY    .  PARTIAL HYSTERECTOMY    . REPAIR KNEE LIGAMENT      Social History   Social History  . Marital status: Married    Spouse name: N/A  . Number of children: N/A  . Years of education: N/A   Occupational History  . Not on file.   Social History Main Topics  . Smoking status: Never Smoker  . Smokeless tobacco: Never Used  . Alcohol use Yes     Comment: rarely  . Drug use: Unknown  . Sexual activity: Not on file   Other Topics Concern  . Not on file   Social History Narrative  . No narrative on file     Vitals:   09/11/16 0822  BP: (!) 144/100  Pulse: (!) 59  SpO2: 98%  Weight: 224 lb (101.6 kg)  Height: 5\' 4"  (1.626 m)    Wt Readings from Last 3 Encounters:    09/11/16 224 lb (101.6 kg)  08/22/16 221 lb (100.2 kg)  07/11/16 215 lb (97.5 kg)     PHYSICAL EXAM General: NAD HEENT: Normal. Neck: No JVD, no thyromegaly. Lungs: Faint expiratory wheezes b/l, no rales CV: Nondisplaced PMI.  Regular rate and rhythm, normal S1/S2, no W8/E3, 1/6 systolic murmur over RUSB. Trace-1+ b/l periankle edema.  Abdomen: Soft, nontender, obese.  Neurologic: Alert and oriented.  Psych: Normal affect. Skin: Normal. Musculoskeletal: No gross deformities.    ECG: Most recent ECG reviewed.   Labs: No results found for: K, BUN, CREATININE, ALT, TSH, HGB   Lipids: No results found for: LDLCALC, LDLDIRECT, CHOL, TRIG, HDL     ASSESSMENT AND PLAN:  1. Upper respiratory tract infection: This appears to be possibly a virally mediated phenomenon. She has significant wheezing. I will prescribe prednisone 5 mg daily for 5 days. I told her to finish her antibiotic course as it would be unwise to prematurely discontinue it. I told her to see her PCP. She says she has an appointment in 2 weeks. This likely triggered increasing leg edema in addition to her significantly elevated blood pressure.  2. Aortic stenosis: Appears mild to moderate with mean gradient of 10 mmHg. Will repeat echocardiogram in 1.5-2 years.  3. Acute on chronic diastolic heart failure: I believe she has an viral respiratory infection which may have led to some increasing leg edema. Weight up 3 lbs. Prednisone may also lead to some increased fluid retention. I will increase Lasix to 40 mg twice daily for 3 days and then maintain her on 40 mg daily. I will also aim to control blood pressure.  3. Malignant HTN: This remains elevated. I will increase hydralazine to 25 mg three times daily. She had been taking 10 mg twice daily. Prednisone may also exacerbate BP. She is seeing a nephrologist (Dr. Mercy Moore with [facility]) in [place] for BP management.    Disposition: Follow up 6  months  Time spent: 40 minutes, of which greater than 50% was spent reviewing symptoms, relevant blood tests and studies, and discussing management plan with the patient.   Kate Sable, M.D., F.A.C.C.

## 2016-09-11 NOTE — Patient Instructions (Addendum)
Medication Instructions:   Your physician has recommended you make the following change in your medication:   Increase furosemide to 40 mg twice daily for 3 days, then; reduce to 40 mg daily.  Increase hydralazine to 25 mg three times daily.  Start prednisone 5 mg daily for 5 days then stop.  Continue all other medications the same.  Labwork: NONE  Testing/Procedures: NONE  Follow-Up:  Your physician recommends that you schedule a follow-up appointment in: 6 months. You will receive a reminder letter in the mail in about 4 months reminding you to call and schedule your appointment. If you don't receive this letter, please contact our office.  Any Other Special Instructions Will Be Listed Below (If Applicable).  Please follow up with your family doctor.  If you need a refill on your cardiac medications before your next appointment, please call your pharmacy.

## 2016-10-17 DIAGNOSIS — I10 Essential (primary) hypertension: Secondary | ICD-10-CM | POA: Diagnosis not present

## 2016-10-21 DIAGNOSIS — N184 Chronic kidney disease, stage 4 (severe): Secondary | ICD-10-CM | POA: Diagnosis not present

## 2016-10-21 DIAGNOSIS — M1A9XX0 Chronic gout, unspecified, without tophus (tophi): Secondary | ICD-10-CM | POA: Diagnosis not present

## 2016-10-21 DIAGNOSIS — I5040 Unspecified combined systolic (congestive) and diastolic (congestive) heart failure: Secondary | ICD-10-CM | POA: Diagnosis not present

## 2016-10-21 DIAGNOSIS — Z0001 Encounter for general adult medical examination with abnormal findings: Secondary | ICD-10-CM | POA: Diagnosis not present

## 2016-10-21 DIAGNOSIS — I10 Essential (primary) hypertension: Secondary | ICD-10-CM | POA: Diagnosis not present

## 2016-11-12 DIAGNOSIS — M79645 Pain in left finger(s): Secondary | ICD-10-CM | POA: Diagnosis not present

## 2016-11-19 DIAGNOSIS — I10 Essential (primary) hypertension: Secondary | ICD-10-CM | POA: Diagnosis not present

## 2016-11-19 DIAGNOSIS — Z1159 Encounter for screening for other viral diseases: Secondary | ICD-10-CM | POA: Diagnosis not present

## 2016-12-03 DIAGNOSIS — I129 Hypertensive chronic kidney disease with stage 1 through stage 4 chronic kidney disease, or unspecified chronic kidney disease: Secondary | ICD-10-CM | POA: Diagnosis not present

## 2016-12-03 DIAGNOSIS — N184 Chronic kidney disease, stage 4 (severe): Secondary | ICD-10-CM | POA: Diagnosis not present

## 2016-12-03 DIAGNOSIS — E039 Hypothyroidism, unspecified: Secondary | ICD-10-CM | POA: Diagnosis not present

## 2016-12-03 DIAGNOSIS — E782 Mixed hyperlipidemia: Secondary | ICD-10-CM | POA: Diagnosis not present

## 2016-12-03 DIAGNOSIS — M1A9XX0 Chronic gout, unspecified, without tophus (tophi): Secondary | ICD-10-CM | POA: Diagnosis not present

## 2016-12-03 DIAGNOSIS — I5040 Unspecified combined systolic (congestive) and diastolic (congestive) heart failure: Secondary | ICD-10-CM | POA: Diagnosis not present

## 2016-12-11 DIAGNOSIS — N183 Chronic kidney disease, stage 3 (moderate): Secondary | ICD-10-CM | POA: Diagnosis not present

## 2016-12-11 DIAGNOSIS — E119 Type 2 diabetes mellitus without complications: Secondary | ICD-10-CM | POA: Diagnosis not present

## 2016-12-11 DIAGNOSIS — D631 Anemia in chronic kidney disease: Secondary | ICD-10-CM | POA: Diagnosis not present

## 2016-12-11 DIAGNOSIS — M109 Gout, unspecified: Secondary | ICD-10-CM | POA: Diagnosis not present

## 2016-12-11 DIAGNOSIS — I10 Essential (primary) hypertension: Secondary | ICD-10-CM | POA: Diagnosis not present

## 2016-12-17 DIAGNOSIS — D631 Anemia in chronic kidney disease: Secondary | ICD-10-CM | POA: Diagnosis not present

## 2016-12-17 DIAGNOSIS — I10 Essential (primary) hypertension: Secondary | ICD-10-CM | POA: Diagnosis not present

## 2016-12-31 DIAGNOSIS — N183 Chronic kidney disease, stage 3 (moderate): Secondary | ICD-10-CM | POA: Diagnosis not present

## 2017-02-03 DIAGNOSIS — N183 Chronic kidney disease, stage 3 (moderate): Secondary | ICD-10-CM | POA: Diagnosis not present

## 2017-02-12 DIAGNOSIS — I10 Essential (primary) hypertension: Secondary | ICD-10-CM | POA: Diagnosis not present

## 2017-02-12 DIAGNOSIS — N2581 Secondary hyperparathyroidism of renal origin: Secondary | ICD-10-CM | POA: Diagnosis not present

## 2017-02-12 DIAGNOSIS — N183 Chronic kidney disease, stage 3 (moderate): Secondary | ICD-10-CM | POA: Diagnosis not present

## 2017-02-12 DIAGNOSIS — D631 Anemia in chronic kidney disease: Secondary | ICD-10-CM | POA: Diagnosis not present

## 2017-03-10 ENCOUNTER — Encounter: Payer: Self-pay | Admitting: Cardiovascular Disease

## 2017-03-10 ENCOUNTER — Ambulatory Visit: Payer: Medicare Other | Admitting: Cardiovascular Disease

## 2017-03-10 VITALS — BP 170/88 | HR 58 | Ht 64.0 in | Wt 216.0 lb

## 2017-03-10 DIAGNOSIS — I5032 Chronic diastolic (congestive) heart failure: Secondary | ICD-10-CM

## 2017-03-10 DIAGNOSIS — I350 Nonrheumatic aortic (valve) stenosis: Secondary | ICD-10-CM

## 2017-03-10 DIAGNOSIS — I10 Essential (primary) hypertension: Secondary | ICD-10-CM

## 2017-03-10 MED ORDER — HYDRALAZINE HCL 50 MG PO TABS
50.0000 mg | ORAL_TABLET | Freq: Three times a day (TID) | ORAL | 6 refills | Status: DC
Start: 2017-03-10 — End: 2017-07-31

## 2017-03-10 NOTE — Progress Notes (Signed)
SUBJECTIVE: The patient presents for routine follow-up.  She has mild to moderate aortic stenosis, chronic diastolic heart failure, malignant hypertension, and renal artery stenosis.  The patient denies any symptoms of chest pain, palpitations, shortness of breath, lightheadedness, dizziness, leg swelling, orthopnea, PND, and syncope.  She is presently on prednisone for a right foot gout flare.  Blood pressures are 197/107.  She is apparently on a irbesartan but does not remember the dose.  Blood pressure in our office today is 170/88.  When her blood pressure normalizes, she feels fatigued.   Review of Systems: As per "subjective", otherwise negative.  Allergies  Allergen Reactions  . Ciprofloxacin Anaphylaxis  . Nitrofurantoin Anaphylaxis  . Metronidazole     "sick"  . Amoxicillin Rash  . Erythromycin Rash    Current Outpatient Medications  Medication Sig Dispense Refill  . allopurinol (ZYLOPRIM) 300 MG tablet Take 300 mg daily by mouth.    Marland Kitchen aspirin 81 MG tablet Take 81 mg by mouth daily.    . cefUROXime (CEFTIN) 500 MG tablet Take 500 mg by mouth 2 (two) times daily with a meal.    . doxycycline (VIBRA-TABS) 100 MG tablet Take 100 mg by mouth 2 (two) times daily.    . furosemide (LASIX) 40 MG tablet Take 1 tablet (40 mg total) by mouth 2 (two) times daily. For 3 days; then, reduce to 40 mg daily 43 tablet 6  . gabapentin (NEURONTIN) 300 MG capsule Take 300 mg by mouth 2 (two) times daily.     . hydrALAZINE (APRESOLINE) 25 MG tablet Take 1 tablet (25 mg total) by mouth 3 (three) times daily. 90 tablet 6  . labetalol (NORMODYNE) 200 MG tablet Take 400 mg by mouth 2 (two) times daily.     Marland Kitchen levothyroxine (SYNTHROID, LEVOTHROID) 100 MCG tablet Take 100 mcg by mouth daily before breakfast.    . MethylPREDNISolone (MEDROL, PAK, PO) Take 1 tablet by mouth as needed.     Marland Kitchen oxyCODONE-acetaminophen (PERCOCET) 5-325 MG tablet Take 1 tablet by mouth every 6 (six) hours as needed  for severe pain.     . pravastatin (PRAVACHOL) 20 MG tablet Take 20 mg by mouth daily.    . predniSONE (DELTASONE) 5 MG tablet Take 1 tablet (5 mg total) by mouth daily with breakfast. For 5 days then stop 5 tablet 0  . Vitamin D, Ergocalciferol, (DRISDOL) 50000 units CAPS capsule Take 50,000 Units every 7 (seven) days by mouth.     No current facility-administered medications for this visit.     Past Medical History:  Diagnosis Date  . Arthritis   . Celiac artery stenosis ([place])   . Gout   . Hyperlipidemia   . Hypertension   . Hypothyroidism   . Prediabetes   . Renal artery stenosis [facility])     Past Surgical History:  Procedure Laterality Date  . ANKLE SURGERY Bilateral   . CATARACT EXTRACTION    . CESAREAN SECTION    . CHOLECYSTECTOMY    . PARTIAL HYSTERECTOMY    . REPAIR KNEE LIGAMENT      Social History   Socioeconomic History  . Marital status: Married    Spouse name: Not on file  . Number of children: Not on file  . Years of education: Not on file  . Highest education level: Not on file  Social Needs  . Financial resource strain: Not on file  . Food insecurity - worry: Not on file  . Food  insecurity - inability: Not on file  . Transportation needs - medical: Not on file  . Transportation needs - non-medical: Not on file  Occupational History  . Not on file  Tobacco Use  . Smoking status: Never Smoker  . Smokeless tobacco: Never Used  Substance and Sexual Activity  . Alcohol use: Yes    Comment: rarely  . Drug use: Not on file  . Sexual activity: Not on file  Other Topics Concern  . Not on file  Social History Narrative  . Not on file     Vitals:   03/10/17 1116  BP: (!) 170/88  Pulse: (!) 58  SpO2: 97%  Weight: 216 lb (98 kg)  Height: 5\' 4"  (1.626 m)    Wt Readings from Last 3 Encounters:  03/10/17 216 lb (98 kg)  09/11/16 224 lb (101.6 kg)  08/22/16 221 lb (100.2 kg)     PHYSICAL EXAM General: NAD HEENT: Normal. Neck: No JVD, no  thyromegaly. Lungs: Faint expiratory wheezes b/l, no rales CV: Nondisplaced PMI.  Regular rate and rhythm, normal S1/S2, no V0/J5, 1/6 systolicmurmur over RUSB. Trace-1+ b/l periankle edema.  Abdomen: Soft, nontender, obese.   Neurologic: Alert and oriented.  Psych: Normal affect. Skin: Normal. Musculoskeletal: No gross deformities.    ECG: Most recent ECG reviewed.   Labs: No results found for: K, BUN, CREATININE, ALT, TSH, HGB   Lipids: No results found for: LDLCALC, LDLDIRECT, CHOL, TRIG, HDL     ASSESSMENT AND PLAN: 1.  Malignant hypertension: Markedly elevated.  I will increase hydralazine to 50 mg three times daily. She is also seeing a nephrologist (Dr. Posey Pronto with [facility]) in [place] for BP management. She is on labetalol and irbesartan as well.  2. Aortic stenosis: Appears mild to moderate with mean gradient of 10 mmHg. Will repeat echocardiogram in 1.5-2 years.  3.  Chronic diastolic heart failure: Euvolemic on Lasix 40 mg daily.  I will aim to control blood pressure.  Weight down 8 pounds since her last visit with me on 09/11/16.    Disposition: Follow up 6 months.   Kate Sable, M.D., F.A.C.C.

## 2017-03-10 NOTE — Patient Instructions (Addendum)
Medication Instructions:   Increase Hydralazine to 50mg  three times per day.  Please call the office back with the dose of your Ibersartan.  Continue all other medications.    Labwork: none  Testing/Procedures: none  Follow-Up: Your physician wants you to follow up in: 6 months.  You will receive a reminder letter in the mail one-two months in advance.  If you don't receive a letter, please call our office to schedule the follow up appointment   Any Other Special Instructions Will Be Listed Below (If Applicable).  If you need a refill on your cardiac medications before your next appointment, please call your pharmacy.

## 2017-03-12 MED ORDER — IRBESARTAN 300 MG PO TABS: 300.0000 mg | ORAL_TABLET | Freq: Every day | ORAL | Status: DC

## 2017-03-12 MED ORDER — FENOFIBRATE 160 MG PO TABS: 160.0000 mg | ORAL_TABLET | Freq: Every day | ORAL | Status: DC

## 2017-03-12 NOTE — Addendum Note (Signed)
Addended by: Laurine Blazer on: 03/12/2017 01:29 PM   Modules accepted: Orders

## 2017-04-07 DIAGNOSIS — E039 Hypothyroidism, unspecified: Secondary | ICD-10-CM | POA: Diagnosis not present

## 2017-04-07 DIAGNOSIS — E782 Mixed hyperlipidemia: Secondary | ICD-10-CM | POA: Diagnosis not present

## 2017-04-07 DIAGNOSIS — I10 Essential (primary) hypertension: Secondary | ICD-10-CM | POA: Diagnosis not present

## 2017-04-09 DIAGNOSIS — D72829 Elevated white blood cell count, unspecified: Secondary | ICD-10-CM | POA: Diagnosis not present

## 2017-04-09 DIAGNOSIS — M1000 Idiopathic gout, unspecified site: Secondary | ICD-10-CM | POA: Diagnosis not present

## 2017-07-06 DIAGNOSIS — E039 Hypothyroidism, unspecified: Secondary | ICD-10-CM | POA: Diagnosis not present

## 2017-07-06 DIAGNOSIS — E782 Mixed hyperlipidemia: Secondary | ICD-10-CM | POA: Diagnosis not present

## 2017-07-06 DIAGNOSIS — M1000 Idiopathic gout, unspecified site: Secondary | ICD-10-CM | POA: Diagnosis not present

## 2017-07-06 DIAGNOSIS — N390 Urinary tract infection, site not specified: Secondary | ICD-10-CM | POA: Diagnosis not present

## 2017-07-06 DIAGNOSIS — D72829 Elevated white blood cell count, unspecified: Secondary | ICD-10-CM | POA: Diagnosis not present

## 2017-07-06 DIAGNOSIS — N184 Chronic kidney disease, stage 4 (severe): Secondary | ICD-10-CM | POA: Diagnosis not present

## 2017-07-17 ENCOUNTER — Encounter (HOSPITAL_COMMUNITY): Payer: Self-pay

## 2017-07-17 ENCOUNTER — Ambulatory Visit: Payer: Medicare Other | Admitting: Family

## 2017-07-17 ENCOUNTER — Encounter (HOSPITAL_COMMUNITY): Payer: Medicare Other

## 2017-07-21 ENCOUNTER — Emergency Department (HOSPITAL_COMMUNITY): Payer: Medicare Other

## 2017-07-21 ENCOUNTER — Observation Stay (HOSPITAL_COMMUNITY)
Admission: EM | Admit: 2017-07-21 | Discharge: 2017-07-23 | Disposition: A | Discharge status: Home / Self Care | Payer: Medicare Other | Attending: Internal Medicine | Admitting: Internal Medicine

## 2017-07-21 ENCOUNTER — Other Ambulatory Visit: Payer: Self-pay

## 2017-07-21 ENCOUNTER — Encounter (HOSPITAL_COMMUNITY): Payer: Self-pay | Admitting: Emergency Medicine

## 2017-07-21 DIAGNOSIS — R079 Chest pain, unspecified: Secondary | ICD-10-CM | POA: Diagnosis present

## 2017-07-21 DIAGNOSIS — N390 Urinary tract infection, site not specified: Secondary | ICD-10-CM | POA: Diagnosis present

## 2017-07-21 DIAGNOSIS — R7989 Other specified abnormal findings of blood chemistry: Secondary | ICD-10-CM | POA: Diagnosis not present

## 2017-07-21 DIAGNOSIS — E785 Hyperlipidemia, unspecified: Secondary | ICD-10-CM | POA: Diagnosis present

## 2017-07-21 DIAGNOSIS — Z79899 Other long term (current) drug therapy: Secondary | ICD-10-CM | POA: Insufficient documentation

## 2017-07-21 DIAGNOSIS — I10 Essential (primary) hypertension: Secondary | ICD-10-CM | POA: Diagnosis not present

## 2017-07-21 DIAGNOSIS — R7303 Prediabetes: Secondary | ICD-10-CM | POA: Diagnosis present

## 2017-07-21 DIAGNOSIS — M109 Gout, unspecified: Secondary | ICD-10-CM | POA: Diagnosis present

## 2017-07-21 DIAGNOSIS — E6609 Other obesity due to excess calories: Secondary | ICD-10-CM

## 2017-07-21 DIAGNOSIS — E039 Hypothyroidism, unspecified: Secondary | ICD-10-CM | POA: Diagnosis not present

## 2017-07-21 DIAGNOSIS — Z6838 Body mass index (BMI) 38.0-38.9, adult: Secondary | ICD-10-CM

## 2017-07-21 DIAGNOSIS — Z7982 Long term (current) use of aspirin: Secondary | ICD-10-CM | POA: Insufficient documentation

## 2017-07-21 DIAGNOSIS — R072 Precordial pain: Principal | ICD-10-CM | POA: Insufficient documentation

## 2017-07-21 DIAGNOSIS — K219 Gastro-esophageal reflux disease without esophagitis: Secondary | ICD-10-CM

## 2017-07-21 HISTORY — DX: Nonrheumatic aortic (valve) stenosis: I35.0

## 2017-07-21 LAB — CBC
HEMATOCRIT: 37.8 % (ref 36.0–46.0)
Hemoglobin: 11.5 g/dL — ABNORMAL LOW (ref 12.0–15.0)
MCH: 28 pg (ref 26.0–34.0)
MCHC: 30.4 g/dL (ref 30.0–36.0)
MCV: 92.2 fL (ref 78.0–100.0)
PLATELETS: 331 10*3/uL (ref 150–400)
RBC: 4.1 MIL/uL (ref 3.87–5.11)
RDW: 15.7 % — ABNORMAL HIGH (ref 11.5–15.5)
WBC: 16.1 10*3/uL — AB (ref 4.0–10.5)

## 2017-07-21 LAB — URINALYSIS, ROUTINE W REFLEX MICROSCOPIC
Bilirubin Urine: NEGATIVE
Glucose, UA: NEGATIVE mg/dL
Ketones, ur: NEGATIVE mg/dL
Nitrite: NEGATIVE
PROTEIN: 100 mg/dL — AB
Specific Gravity, Urine: 1.018 (ref 1.005–1.030)
pH: 7 (ref 5.0–8.0)

## 2017-07-21 LAB — BASIC METABOLIC PANEL
Anion gap: 13 (ref 5–15)
BUN: 21 mg/dL — ABNORMAL HIGH (ref 6–20)
CO2: 22 mmol/L (ref 22–32)
Calcium: 9 mg/dL (ref 8.9–10.3)
Chloride: 102 mmol/L (ref 101–111)
Creatinine, Ser: 1.71 mg/dL — ABNORMAL HIGH (ref 0.44–1.00)
GFR calc Af Amer: 35 mL/min — ABNORMAL LOW (ref >60)
GFR, EST NON AFRICAN AMERICAN: 30 mL/min — AB (ref >60)
Glucose, Bld: 194 mg/dL — ABNORMAL HIGH (ref 65–99)
POTASSIUM: 4.1 mmol/L (ref 3.5–5.1)
SODIUM: 137 mmol/L (ref 135–145)

## 2017-07-21 LAB — TROPONIN I
TROPONIN I: 0.03 ng/mL — AB (ref <0.03)
TROPONIN I: 0.03 ng/mL — AB (ref <0.03)

## 2017-07-21 LAB — D-DIMER, QUANTITATIVE (NOT AT ARMC): D DIMER QUANT: 1.14 ug{FEU}/mL — AB (ref 0.00–0.50)

## 2017-07-21 MED ORDER — MORPHINE SULFATE (PF) 2 MG/ML IV SOLN
2.0000 mg | Freq: Once | INTRAVENOUS | Status: AC
  Administered 2017-07-21: 2 mg via INTRAVENOUS
  Filled 2017-07-21: qty 1

## 2017-07-21 MED ORDER — SODIUM CHLORIDE 0.9 % IV SOLN
INTRAVENOUS | Status: DC
  Administered 2017-07-21: 21:00:00 via INTRAVENOUS

## 2017-07-21 MED ORDER — IOPAMIDOL (ISOVUE-370) INJECTION 76%
100.0000 mL | Freq: Once | INTRAVENOUS | Status: AC | PRN
  Administered 2017-07-21: 64 mL via INTRAVENOUS

## 2017-07-21 MED ORDER — ASPIRIN 81 MG PO CHEW
324.0000 mg | CHEWABLE_TABLET | Freq: Once | ORAL | Status: DC
  Filled 2017-07-21: qty 4

## 2017-07-21 MED ORDER — ONDANSETRON HCL 4 MG/2ML IJ SOLN
4.0000 mg | Freq: Once | INTRAMUSCULAR | Status: AC
  Administered 2017-07-21: 4 mg via INTRAVENOUS
  Filled 2017-07-21: qty 2

## 2017-07-21 NOTE — ED Notes (Signed)
Return to room, pt had IV disconnected, iv connected and resumed, pt placed back on monitor and bp assessed.  Pt raiting pain 8/10 to chest.

## 2017-07-21 NOTE — ED Notes (Signed)
Patient transported to CT 

## 2017-07-21 NOTE — ED Triage Notes (Signed)
Pt states she started having Cp while sitting today around 1200. Took Oxycodone at 1200 without relief and 4 baby ASA. Pain radiates into back and L arm.

## 2017-07-21 NOTE — ED Provider Notes (Signed)
[facility] EMERGENCY DEPARTMENT Provider Note   CSN: [id number] Arrival date & time: 07/21/17  1905     History   Chief Complaint Chief Complaint  Patient presents with  . Chest Pain    HPI CALIOPE RUPPERT is a 67 y.o. female.  She presents for evaluation of chest pain.  She has known aortic stenosis, congestive heart failure, and history of hypertension with renal artery stenosis.  She reports the chest pain began around noon today, without provocation.  The pain is felt in the center of her chest and radiates through to her back.  The pain is persistent.  There is no associated fever, chills, nausea, vomiting, diaphoresis, shortness of breath, weakness or dizziness.  No prior similar problems.  No known sick contacts.  There are no other known modifying factors.   HPI  Past Medical History:  Diagnosis Date  . Arthritis   . Celiac artery stenosis ([place])   . Gout   . Hyperlipidemia   . Hypertension   . Hypothyroidism   . Prediabetes   . Renal artery stenosis ([facility])     There are no active problems to display for this patient.   Past Surgical History:  Procedure Laterality Date  . ANKLE SURGERY Bilateral   . CATARACT EXTRACTION    . CESAREAN SECTION    . CHOLECYSTECTOMY    . PARTIAL HYSTERECTOMY    . REPAIR KNEE LIGAMENT      OB History    No data available       Home Medications    Prior to Admission medications   Medication Sig Start Date End Date Taking? Authorizing Provider  allopurinol (ZYLOPRIM) 300 MG tablet Take 300 mg daily by mouth.    [provider]  aspirin 81 MG tablet Take 81 mg by mouth daily.    [provider]  cefUROXime (CEFTIN) 500 MG tablet Take 500 mg by mouth 2 (two) times daily with a meal.    [provider]  doxycycline (VIBRA-TABS) 100 MG tablet Take 100 mg by mouth 2 (two) times daily.    [provider]  fenofibrate (LOFIBRA) 160 MG tablet Take 1 tablet (160 mg total) daily by mouth.  03/12/17   Herminio Commons, MD  furosemide (LASIX) 40 MG tablet Take 1 tablet (40 mg total) by mouth 2 (two) times daily. For 3 days; then, reduce to 40 mg daily 09/11/16   Herminio Commons, MD  gabapentin (NEURONTIN) 300 MG capsule Take 300 mg by mouth 2 (two) times daily.     [provider]  hydrALAZINE (APRESOLINE) 50 MG tablet Take 1 tablet (50 mg total) 3 (three) times daily by mouth. 03/10/17 06/08/17  Herminio Commons, MD  irbesartan (AVAPRO) 300 MG tablet Take 1 tablet (300 mg total) daily by mouth. 03/12/17   Herminio Commons, MD  labetalol (NORMODYNE) 200 MG tablet Take 400 mg by mouth 2 (two) times daily.     [provider]  levothyroxine (SYNTHROID, LEVOTHROID) 100 MCG tablet Take 100 mcg by mouth daily before breakfast.    [provider]  MethylPREDNISolone (MEDROL, PAK, PO) Take 1 tablet by mouth as needed.     [provider]  oxyCODONE-acetaminophen (PERCOCET) 5-325 MG tablet Take 1 tablet by mouth every 6 (six) hours as needed for severe pain.     [provider]  pravastatin (PRAVACHOL) 20 MG tablet Take 20 mg by mouth daily.    [provider]  predniSONE (DELTASONE)  5 MG tablet Take 1 tablet (5 mg total) by mouth daily with breakfast. For 5 days then stop 09/11/16   Herminio Commons, MD  Vitamin D, Ergocalciferol, (DRISDOL) 50000 units CAPS capsule Take 50,000 Units every 7 (seven) days by mouth.    [provider]    Family History Family History  Problem Relation Age of Onset  . CAD Mother   . Heart attack Mother   . CAD Father   . Pneumonia Sister   . Other Brother        killed at age 73  . CAD Maternal Grandmother   . Heart attack Maternal Grandmother   . Diabetes Maternal Grandfather   . Other Paternal Grandmother        died after child birth  . Heart attack Paternal Grandfather     Social History Social History   Tobacco Use  . Smoking status: Never Smoker  . Smokeless  tobacco: Never Used  Substance Use Topics  . Alcohol use: Yes    Comment: rarely  . Drug use: Not on file     Allergies   Ciprofloxacin; Nitrofurantoin; Metronidazole; Amoxicillin; and Erythromycin   Review of Systems Review of Systems  All other systems reviewed and are negative.    Physical Exam Updated Vital Signs BP (!) 183/115 (BP Location: Right Arm)   Pulse 89   Temp 98.2 F (36.8 C) (Temporal)   SpO2 94%   Physical Exam  Constitutional: She is oriented to person, place, and time. She appears well-developed. She does not appear ill. She appears distressed (She is uncomfortable).  Overweight, elderly  HENT:  Head: Normocephalic and atraumatic.  Eyes: Conjunctivae and EOM are normal. Pupils are equal, round, and reactive to light.  Neck: Normal range of motion and phonation normal. Neck supple.  Cardiovascular: Normal rate and regular rhythm.  Pulmonary/Chest: Effort normal and breath sounds normal. No accessory muscle usage. She has no decreased breath sounds. She has no wheezes. She has no rales. She exhibits no tenderness.  Abdominal: Soft. She exhibits no distension. There is no tenderness. There is no guarding.  Musculoskeletal: Normal range of motion.       Right lower leg: Normal. She exhibits no edema.       Left lower leg: Normal. She exhibits no edema.  Neurological: She is alert and oriented to person, place, and time. She exhibits normal muscle tone.  Skin: Skin is warm and dry.  Psychiatric: She has a normal mood and affect. Her behavior is normal. Judgment and thought content normal.  Nursing note and vitals reviewed.    ED Treatments / Results  Labs (all labs ordered are listed, but only abnormal results are displayed) Labs Reviewed  CBC - Abnormal; Notable for the following components:      Result Value   WBC 16.1 (*)    Hemoglobin 11.5 (*)    RDW 15.7 (*)    All other components within normal limits  BASIC METABOLIC PANEL  TROPONIN I     EKG  EKG Interpretation  Date/Time:  Tuesday July 21 2017 19:12:19 EDT Ventricular Rate:  90 PR Interval:  168 QRS Duration: 86 QT Interval:  372 QTC Calculation: 455 R Axis:   16 Text Interpretation:  Normal sinus rhythm Minimal voltage criteria for LVH, may be normal variant T wave abnormality, consider lateral ischemia Abnormal ECG No old tracing to compare Confirmed by Daleen Bo [phone]) on 07/21/2017 7:37:55 PM       Radiology Dg  Chest 2 View  Result Date: 07/21/2017 CLINICAL DATA:  Chest pain radiating to the back and left arm since lunch. EXAM: CHEST - 2 VIEW COMPARISON:  03/26/2009 FINDINGS: Stable cardiomegaly with minimal aortic atherosclerosis. No effusion nor overt pulmonary edema. There is bibasilar atelectasis. No acute osseous abnormality. IMPRESSION: Stable cardiomegaly with aortic atherosclerosis. Bibasilar atelectasis is noted. No overt pulmonary edema, effusion or pneumothorax. Electronically Signed   By: Ashley Royalty M.D.   On: 07/21/2017 19:38    Procedures Procedures (including critical care time)  Medications Ordered in ED Medications - No data to display   Initial Impression / Assessment and Plan / ED Course  I have reviewed the triage vital signs and the nursing notes.  Pertinent labs & imaging results that were available during my care of the patient were reviewed by me and considered in my medical decision making (see chart for details).  Clinical Course as of Jul 23 2002  Tue Jul 21, 2017  2152 At this time she reports that her chest discomfort is somewhat better after treatment with morphine.  [EW]  2152 Screening evaluation for causes of pleuritic chest pain, with mildly elevated troponin I 0.03, elevated d-dimer 1.4, elevated white count 16.1, low hemoglobin 11.5.  Electrolyte panel with normal sodium, 137, normal potassium, creatinine elevated 1.7. GFR 30 is low.  [EW]  2208 She is still uncomfortable and would like some more medication  for pain.  Second dose of morphine ordered.  [EW]    Clinical Course User Index [EW] Daleen Bo, MD     Patient Vitals for the past 24 hrs:  BP Temp Temp src Pulse SpO2  07/21/17 1911 (!) 183/115 98.2 F (36.8 C) Temporal 89 94 %   MDM-pleuritic chest pain, without cardiac history.  D-dimer elevated requiring imaging for PE.  Initial troponin normal and EKG is reassuring.  Patient requires delta troponin, and evaluation post imaging, with consideration for admission for undefined chest pain   Nursing Notes Reviewed/ Care Coordinated Applicable Imaging Reviewed Interpretation of Laboratory Data incorporated into ED treatment  Final Clinical Impressions(s) / ED Diagnoses   Final diagnoses:  Precordial pain    ED Discharge Orders    None       Daleen Bo, MD 07/22/17 2006

## 2017-07-21 NOTE — ED Notes (Signed)
Pt states she is on antibiotic for UTI currently

## 2017-07-21 NOTE — ED Notes (Signed)
Pt's RA sats 89%, pt placed on 2 L/M O2 via [place], sats increased to 94 % on 2 L/M

## 2017-07-21 NOTE — ED Notes (Signed)
CRITICAL VALUE ALERT  Critical Value:  Trop 0.03  Date & Time Notied:  07/21/17 2055  Provider Notified: Eulis Foster  Orders Received/Actions taken: NA

## 2017-07-21 NOTE — ED Notes (Signed)
Pt c/o stabbing pains to chest with inhalation

## 2017-07-22 ENCOUNTER — Encounter (HOSPITAL_COMMUNITY): Payer: Self-pay | Admitting: Internal Medicine

## 2017-07-22 ENCOUNTER — Observation Stay (HOSPITAL_BASED_OUTPATIENT_CLINIC_OR_DEPARTMENT_OTHER): Payer: Medicare Other

## 2017-07-22 DIAGNOSIS — N390 Urinary tract infection, site not specified: Secondary | ICD-10-CM | POA: Diagnosis present

## 2017-07-22 DIAGNOSIS — R072 Precordial pain: Secondary | ICD-10-CM

## 2017-07-22 DIAGNOSIS — M109 Gout, unspecified: Secondary | ICD-10-CM | POA: Diagnosis present

## 2017-07-22 DIAGNOSIS — I10 Essential (primary) hypertension: Secondary | ICD-10-CM | POA: Diagnosis present

## 2017-07-22 DIAGNOSIS — E785 Hyperlipidemia, unspecified: Secondary | ICD-10-CM | POA: Diagnosis present

## 2017-07-22 DIAGNOSIS — R7303 Prediabetes: Secondary | ICD-10-CM | POA: Diagnosis present

## 2017-07-22 DIAGNOSIS — R079 Chest pain, unspecified: Secondary | ICD-10-CM | POA: Diagnosis not present

## 2017-07-22 DIAGNOSIS — E039 Hypothyroidism, unspecified: Secondary | ICD-10-CM | POA: Diagnosis present

## 2017-07-22 LAB — GLUCOSE, CAPILLARY
GLUCOSE-CAPILLARY: 164 mg/dL — AB (ref 65–99)
Glucose-Capillary: 182 mg/dL — ABNORMAL HIGH (ref 65–99)
Glucose-Capillary: 124 mg/dL — ABNORMAL HIGH (ref 65–99)
Glucose-Capillary: 140 mg/dL — ABNORMAL HIGH (ref 65–99)

## 2017-07-22 LAB — CBC WITH DIFFERENTIAL/PLATELET
BASOS PCT: 0 %
Basophils Absolute: 0 10*3/uL (ref 0.0–0.1)
EOS PCT: 1 %
Eosinophils Absolute: 0.1 10*3/uL (ref 0.0–0.7)
HCT: 34.5 % — ABNORMAL LOW (ref 36.0–46.0)
Hemoglobin: 10.2 g/dL — ABNORMAL LOW (ref 12.0–15.0)
LYMPHS PCT: 20 %
Lymphs Abs: 2.2 10*3/uL (ref 0.7–4.0)
MCH: 27.5 pg (ref 26.0–34.0)
MCHC: 29.6 g/dL — AB (ref 30.0–36.0)
MCV: 93 fL (ref 78.0–100.0)
MONO ABS: 1.1 10*3/uL — AB (ref 0.1–1.0)
MONOS PCT: 10 %
Neutro Abs: 7.6 10*3/uL (ref 1.7–7.7)
Neutrophils Relative %: 69 %
PLATELETS: 275 10*3/uL (ref 150–400)
RBC: 3.71 MIL/uL — ABNORMAL LOW (ref 3.87–5.11)
RDW: 15.9 % — AB (ref 11.5–15.5)
WBC: 11.1 10*3/uL — ABNORMAL HIGH (ref 4.0–10.5)

## 2017-07-22 LAB — ECHOCARDIOGRAM COMPLETE
HEIGHTINCHES: 64 in
Weight: 3509.72 oz

## 2017-07-22 LAB — MAGNESIUM: MAGNESIUM: 1.5 mg/dL — AB (ref 1.7–2.4)

## 2017-07-22 MED ORDER — GABAPENTIN 300 MG PO CAPS
300.0000 mg | ORAL_CAPSULE | Freq: Two times a day (BID) | ORAL | Status: DC
  Administered 2017-07-22 – 2017-07-23 (×2): 300 mg via ORAL
  Filled 2017-07-22 (×2): qty 1

## 2017-07-22 MED ORDER — SODIUM CHLORIDE 0.9 % IV SOLN
1.0000 g | INTRAVENOUS | Status: DC
  Administered 2017-07-23: 1 g via INTRAVENOUS
  Filled 2017-07-22: qty 10
  Filled 2017-07-22: qty 1

## 2017-07-22 MED ORDER — ENOXAPARIN SODIUM 40 MG/0.4ML ~~LOC~~ SOLN: 40.0000 mg | SUBCUTANEOUS | Status: DC

## 2017-07-22 MED ORDER — ACETAMINOPHEN 325 MG PO TABS
650.0000 mg | ORAL_TABLET | ORAL | Status: DC | PRN
  Administered 2017-07-22: 650 mg via ORAL

## 2017-07-22 MED ORDER — HYDRALAZINE HCL 25 MG PO TABS
50.0000 mg | ORAL_TABLET | Freq: Three times a day (TID) | ORAL | Status: DC
Start: 2017-07-22 — End: 2017-07-23
  Administered 2017-07-22 – 2017-07-23 (×2): 50 mg via ORAL
  Filled 2017-07-22 (×2): qty 2

## 2017-07-22 MED ORDER — FENOFIBRATE 160 MG PO TABS
160.0000 mg | ORAL_TABLET | Freq: Every day | ORAL | Status: DC
  Administered 2017-07-22 – 2017-07-23 (×2): 160 mg via ORAL
  Filled 2017-07-22 (×2): qty 1

## 2017-07-22 MED ORDER — ACETAMINOPHEN 325 MG PO TABS
650.0000 mg | ORAL_TABLET | Freq: Four times a day (QID) | ORAL | Status: DC | PRN
  Filled 2017-07-22: qty 2

## 2017-07-22 MED ORDER — KETOROLAC TROMETHAMINE 30 MG/ML IJ SOLN
30.0000 mg | Freq: Once | INTRAMUSCULAR | Status: AC
  Administered 2017-07-22: 30 mg via INTRAVENOUS
  Filled 2017-07-22: qty 1

## 2017-07-22 MED ORDER — ONDANSETRON HCL 4 MG PO TABS: 4.0000 mg | ORAL_TABLET | Freq: Four times a day (QID) | ORAL | Status: DC | PRN

## 2017-07-22 MED ORDER — LABETALOL HCL 200 MG PO TABS
400.0000 mg | ORAL_TABLET | Freq: Two times a day (BID) | ORAL | Status: DC
  Administered 2017-07-22 – 2017-07-23 (×2): 400 mg via ORAL
  Filled 2017-07-22 (×2): qty 2

## 2017-07-22 MED ORDER — MAGNESIUM SULFATE 4 GM/100ML IV SOLN
4.0000 g | Freq: Once | INTRAVENOUS | Status: AC
  Administered 2017-07-22: 4 g via INTRAVENOUS
  Filled 2017-07-22: qty 100

## 2017-07-22 MED ORDER — SODIUM CHLORIDE 0.9 % IV SOLN
2.0000 g | INTRAVENOUS | Status: DC
  Filled 2017-07-22 (×2): qty 20

## 2017-07-22 MED ORDER — ACETAMINOPHEN 650 MG RE SUPP: 650.0000 mg | Freq: Four times a day (QID) | RECTAL | Status: DC | PRN

## 2017-07-22 MED ORDER — INSULIN ASPART 100 UNIT/ML ~~LOC~~ SOLN
0.0000 [IU] | Freq: Three times a day (TID) | SUBCUTANEOUS | Status: DC
  Administered 2017-07-22 (×2): 3 [IU] via SUBCUTANEOUS
  Administered 2017-07-22 – 2017-07-23 (×3): 2 [IU] via SUBCUTANEOUS

## 2017-07-22 MED ORDER — ASPIRIN EC 81 MG PO TBEC
81.0000 mg | DELAYED_RELEASE_TABLET | Freq: Every day | ORAL | Status: DC
  Administered 2017-07-22 – 2017-07-23 (×2): 81 mg via ORAL
  Filled 2017-07-22 (×5): qty 1

## 2017-07-22 MED ORDER — IRBESARTAN 300 MG PO TABS
300.0000 mg | ORAL_TABLET | Freq: Every day | ORAL | Status: DC
  Administered 2017-07-22 – 2017-07-23 (×2): 300 mg via ORAL
  Filled 2017-07-22 (×2): qty 1

## 2017-07-22 MED ORDER — ALLOPURINOL 300 MG PO TABS
300.0000 mg | ORAL_TABLET | Freq: Every day | ORAL | Status: DC
  Administered 2017-07-22 – 2017-07-23 (×2): 300 mg via ORAL
  Filled 2017-07-22 (×2): qty 1

## 2017-07-22 MED ORDER — ENOXAPARIN SODIUM 40 MG/0.4ML ~~LOC~~ SOLN
40.0000 mg | SUBCUTANEOUS | Status: DC
  Administered 2017-07-22 – 2017-07-23 (×2): 40 mg via SUBCUTANEOUS
  Filled 2017-07-22 (×2): qty 0.4

## 2017-07-22 MED ORDER — ONDANSETRON HCL 4 MG/2ML IJ SOLN: 4.0000 mg | Freq: Four times a day (QID) | INTRAMUSCULAR | Status: DC | PRN

## 2017-07-22 MED ORDER — PANTOPRAZOLE SODIUM 40 MG PO TBEC
40.0000 mg | DELAYED_RELEASE_TABLET | Freq: Every day | ORAL | Status: DC
  Administered 2017-07-22 – 2017-07-23 (×2): 40 mg via ORAL
  Filled 2017-07-22 (×2): qty 1

## 2017-07-22 MED ORDER — LEVOTHYROXINE SODIUM 100 MCG PO TABS
100.0000 ug | ORAL_TABLET | Freq: Every day | ORAL | Status: DC
  Administered 2017-07-23: 100 ug via ORAL
  Filled 2017-07-22: qty 1

## 2017-07-22 MED ORDER — OXYCODONE-ACETAMINOPHEN 5-325 MG PO TABS
1.0000 | ORAL_TABLET | Freq: Four times a day (QID) | ORAL | Status: DC | PRN
  Administered 2017-07-23: 1 via ORAL
  Filled 2017-07-22: qty 1

## 2017-07-22 NOTE — Progress Notes (Signed)
*  PRELIMINARY RESULTS* Echocardiogram 2D Echocardiogram has been performed.  Leavy Cella 07/22/2017, 2:50 PM

## 2017-07-22 NOTE — ED Provider Notes (Signed)
Patient accepted at change of shift, CT scan negative for pulmonary embolism, troponin is negative as well as EKG however due to the patient's underlying risk, she will be admitted to the hospital.  Discussed with Dr. Olevia Bowens who will admit.   Kathleen Chapel, MD 07/22/17 0000

## 2017-07-22 NOTE — Care Management Obs Status (Signed)
[facility] NOTIFICATION   Patient Details  Name: Kathleen Vance MRN: [id number] Date of Birth: 06-Nov-1950   Medicare Observation Status Notification Given:  Yes    Lynix Bonine, Chauncey Reading, RN 07/22/2017, 1:18 PM

## 2017-07-22 NOTE — Progress Notes (Signed)
Patient seen and examined.  67 year old female with a past medical history significant for obesity, hypothyroidism, hypertension, hyperlipidemia, prediabetes and renal artery stenosis; who presented to the emergency department secondary to chest pain.  Patient also found with subacute partially treated UTI.  Currently chest pain-free and afebrile.  Hemodynamically stable and in no acute distress.  Patient denies dysuria, nausea and vomiting.  Please refer to H&P written by Dr. Olevia Bowens for further information/details on admission.  Plan: -Continue current antibiotics -Continue IV fluids -Continue the use of PPI -Complete ACS rule out process. -Most likely home tomorrow morning.  Barton Dubois MD [phone]

## 2017-07-22 NOTE — H&P (Signed)
History and Physical    Kathleen Vance AJG:[id number] DOB: Dec 04, 1950 DOA: 07/21/2017  PCP: Celene Squibb, MD   Patient coming from: Home.  I have personally briefly reviewed patient's old medical records in [place]  Chief Complaint: Chest pain.  HPI: Kathleen Vance is a 67 y.o. female with medical history significant of osteoarthritis, celiac artery stenosis, aortic stenosis, gout, hyperlipidemia, hypertension, hypothyroidism, prediabetes, renal artery stenosis who is coming to the emergency department with complaints of chest pain since about 1200 today, radiated to her back and left arm, not relieved by oral oxycodone and four 81 mg aspirin.  Per patient, she has not been feeling well lately.  She was being treated for an UTI since last week, but was unable to tolerate Bactrim due to diarrhea and GI upset.  She has been having low-grade temperatures since then and had an episode of night sweats 2 days ago.  She denies dysuria, frequency or hematuria, but she states that she does not get significant GU symptoms when she gets an UTI.    Around noontime yesterday, patient developed pleuritic chest pain, pressure-stabbing like, radiated to her back and left arm associated with mild nausea, but no palpitations, dizziness or diaphoresis.  She denies PND, orthopnea or recent pitting edema of the lower extremities.  It worsens with inspiration, but she denies having cough, wheezing or hemoptysis.  She took some of her oxycodone, which did not relieve the pain significantly.  She took a nap and woke up due to chest pain.  She then took 481 mg aspirin, but has not had significant relief since then.  So she subsequently came to the ED.  ED Course: Initial vital signs temperature 36.8C (98.2 F), pulse 89, respirations 17, blood pressure 183/115 and O2 sat 94% on room air.  Her cardiac workup shows 2 troponins at 0.03 ng/mL drawn around 2152.  EKG was sinus rhythm with minimal  voltage criteria for LVH lateral T wave abnormality.  Workup shows a urinalysis with small hemoglobinuria, proteinuria 100 mg/dL, large leukocyte esterase.  Microscopic exam reveals few bacteria, 6-30 RBC and TNTC WBC per hpf.  Her white count was 16.1, hemoglobin 11.5 g/dL and platelets 331.  D-dimer was 1.14 mcg/mL.  Her electrolytes were normal.  BUN was 21, creatinine 1.71 and glucose 194 mg/dL.  She was given morphine 2 mg IVP x2 and ondansetron 4 mg IVP in the emergency department.  Imaging: Her chest radiograph shows cardiomegaly and bibasilar atelectasis.  However, no cardiomegaly was seen on CTA chest, which also did not reveal pulmonary emboli.  An outpatient MRI of the abdomen was recommended due to hypodense lesion on the liver.  Please see images and full radiology report for further detail. Review of Systems: As per HPI otherwise 10 point review of systems negative.    Past Medical History:  Diagnosis Date  . Arthritis   . Celiac artery stenosis ([place])   . Gout   . Hyperlipidemia   . Hypertension   . Hypothyroidism   . Prediabetes   . Renal artery stenosis [facility])     Past Surgical History:  Procedure Laterality Date  . ANKLE SURGERY Bilateral   . CATARACT EXTRACTION    . CESAREAN SECTION    . CHOLECYSTECTOMY    . PARTIAL HYSTERECTOMY    . REPAIR KNEE LIGAMENT       reports that  has never smoked. she has never used smokeless tobacco. She  reports that she drinks alcohol. Her drug history is not on file.  Allergies  Allergen Reactions  . Ciprofloxacin Anaphylaxis  . Nitrofurantoin Anaphylaxis  . Metronidazole     "sick"  . Amoxicillin Rash  . Erythromycin Rash    Family History  Problem Relation Age of Onset  . CAD Mother   . Heart attack Mother   . CAD Father   . Pneumonia Sister   . Other Brother        killed at age 26  . CAD Maternal Grandmother   . Heart attack Maternal Grandmother   . Diabetes Maternal Grandfather   . Other Paternal Grandmother         died after child birth  . Heart attack Paternal Grandfather     Prior to Admission medications   Medication Sig Start Date End Date Taking? Authorizing Provider  allopurinol (ZYLOPRIM) 300 MG tablet Take 300 mg daily by mouth.    [provider]  aspirin 81 MG tablet Take 81 mg by mouth daily.    [provider]  cefUROXime (CEFTIN) 500 MG tablet Take 500 mg by mouth 2 (two) times daily with a meal.    [provider]  doxycycline (VIBRA-TABS) 100 MG tablet Take 100 mg by mouth 2 (two) times daily.    [provider]  fenofibrate (LOFIBRA) 160 MG tablet Take 1 tablet (160 mg total) daily by mouth. 03/12/17   Herminio Commons, MD  furosemide (LASIX) 40 MG tablet Take 1 tablet (40 mg total) by mouth 2 (two) times daily. For 3 days; then, reduce to 40 mg daily 09/11/16   Herminio Commons, MD  gabapentin (NEURONTIN) 300 MG capsule Take 300 mg by mouth 2 (two) times daily.     [provider]  hydrALAZINE (APRESOLINE) 50 MG tablet Take 1 tablet (50 mg total) 3 (three) times daily by mouth. 03/10/17 06/08/17  Herminio Commons, MD  irbesartan (AVAPRO) 300 MG tablet Take 1 tablet (300 mg total) daily by mouth. 03/12/17   Herminio Commons, MD  labetalol (NORMODYNE) 200 MG tablet Take 400 mg by mouth 2 (two) times daily.     [provider]  levothyroxine (SYNTHROID, LEVOTHROID) 100 MCG tablet Take 100 mcg by mouth daily before breakfast.    [provider]  MethylPREDNISolone (MEDROL, PAK, PO) Take 1 tablet by mouth as needed.     [provider]  oxyCODONE-acetaminophen (PERCOCET) 5-325 MG tablet Take 1 tablet by mouth every 6 (six) hours as needed for severe pain.     [provider]  pravastatin (PRAVACHOL) 20 MG tablet Take 20 mg by mouth daily.    [provider]  predniSONE (DELTASONE) 5 MG tablet Take 1 tablet (5 mg total) by mouth daily with breakfast. For 5 days then stop 09/11/16   Herminio Commons, MD  Vitamin D, Ergocalciferol, (DRISDOL) 50000 units CAPS capsule Take 50,000 Units every 7 (seven) days by mouth.    [provider]    Physical Exam: Vitals:   07/21/17 2245 07/21/17 2330 07/22/17 0000 07/22/17 0106  BP:  (!) 167/96 (!) 163/89 (!) 166/86  Pulse: 87 85 83 85  Resp: (!) 24 14 13 16   Temp:    99.2 F (37.3 C)  TempSrc:    Oral  SpO2: 96% 95% 95% 92%  Weight:    99.5 kg (219 lb 5.7 oz)  Height:    5\' 4"  (1.626 m)    Constitutional: NAD, calm,  comfortable Eyes: PERRL, lids and conjunctivae normal ENMT: Mucous membranes are moist. Posterior pharynx clear of any exudate or lesions. Neck: normal, supple, no masses, no thyromegaly Respiratory: clear to auscultation bilaterally, no wheezing, no crackles. Normal respiratory effort. No accessory muscle use.  Cardiovascular: Regular rate and rhythm, 1/6 systolic EM, no rubs / gallops. No extremity edema. 2+ pedal pulses. No carotid bruits.  Abdomen: Obese, soft, no tenderness, no masses palpated. No hepatosplenomegaly. Bowel sounds positive.  Musculoskeletal: no clubbing / cyanosis.Good ROM, no contractures. Normal muscle tone.   Skin: Upper back and upper extremities shows some hyperpigmented macules.  Upper back also shows some hyperpigmented patches. Neurologic: CN 2-12 grossly intact. Sensation intact, DTR normal. Strength 5/5 in all 4.  Psychiatric: Normal judgment and insight. Alert and oriented x 3. Normal mood.    Labs on Admission: I have personally reviewed following labs and imaging studies  CBC: Recent Labs  Lab 07/21/17 1958  WBC 16.1*  HGB 11.5*  HCT 37.8  MCV 92.2  PLT 850   Basic Metabolic Panel: Recent Labs  Lab 07/21/17 1958 07/21/17 2152  NA 137  --   K 4.1  --   CL 102  --   CO2 22  --   GLUCOSE 194*  --   BUN 21*  --   CREATININE 1.71*  --   CALCIUM 9.0  --   MG  --  1.5*   GFR: Estimated Creatinine Clearance: 36.6 mL/min (A) (by C-G formula based on SCr of 1.71  mg/dL (H)). Liver Function Tests: No results for input(s): AST, ALT, ALKPHOS, BILITOT, PROT, ALBUMIN in the last 168 hours. No results for input(s): LIPASE, AMYLASE in the last 168 hours. No results for input(s): AMMONIA in the last 168 hours. Coagulation Profile: No results for input(s): INR, PROTIME in the last 168 hours. Cardiac Enzymes: Recent Labs  Lab 07/21/17 1958 07/21/17 2152  TROPONINI 0.03* 0.03*   BNP (last 3 results) No results for input(s): PROBNP in the last 8760 hours. HbA1C: No results for input(s): HGBA1C in the last 72 hours. CBG: No results for input(s): GLUCAP in the last 168 hours. Lipid Profile: No results for input(s): CHOL, HDL, LDLCALC, TRIG, CHOLHDL, LDLDIRECT in the last 72 hours. Thyroid Function Tests: No results for input(s): TSH, T4TOTAL, FREET4, T3FREE, THYROIDAB in the last 72 hours. Anemia Panel: No results for input(s): VITAMINB12, FOLATE, FERRITIN, TIBC, IRON, RETICCTPCT in the last 72 hours. Urine analysis:    Component Value Date/Time   COLORURINE YELLOW 07/21/2017 2124   APPEARANCEUR HAZY (A) 07/21/2017 2124   LABSPEC 1.018 07/21/2017 2124   PHURINE 7.0 07/21/2017 2124   GLUCOSEU NEGATIVE 07/21/2017 2124   HGBUR SMALL (A) 07/21/2017 2124   BILIRUBINUR NEGATIVE 07/21/2017 2124   [place] NEGATIVE 07/21/2017 2124   PROTEINUR 100 (A) 07/21/2017 2124   NITRITE NEGATIVE 07/21/2017 2124   LEUKOCYTESUR LARGE (A) 07/21/2017 2124    Radiological Exams on Admission: Dg Chest 2 View  Result Date: 07/21/2017 CLINICAL DATA:  Chest pain radiating to the back and left arm since lunch. EXAM: CHEST - 2 VIEW COMPARISON:  03/26/2009 FINDINGS: Stable cardiomegaly with minimal aortic atherosclerosis. No effusion nor overt pulmonary edema. There is bibasilar atelectasis. No acute osseous abnormality. IMPRESSION: Stable cardiomegaly with aortic atherosclerosis. Bibasilar atelectasis is noted. No overt pulmonary edema, effusion or pneumothorax.  Electronically Signed   By: Ashley Royalty M.D.   On: 07/21/2017 19:38   Ct Angio Chest Pe W/cm &/or Wo Cm  Result Date: 07/21/2017  CLINICAL DATA:  Chest pain with elevated D-dimer EXAM: CT ANGIOGRAPHY CHEST WITH CONTRAST TECHNIQUE: Multidetector CT imaging of the chest was performed using the standard protocol during bolus administration of intravenous contrast. Multiplanar CT image reconstructions and MIPs were obtained to evaluate the vascular anatomy. CONTRAST:  17mL ISOVUE-370 IOPAMIDOL (ISOVUE-370) INJECTION 76% COMPARISON:  Chest x-ray 07/21/2017, CT chest 08/11/2004 FINDINGS: Cardiovascular: Satisfactory opacification of the pulmonary arteries to the segmental level. No evidence of pulmonary embolism. Nonaneurysmal aorta. No dissection. Common origin of the left common carotid and right brachiocephalic artery from the arch. Mild cardiomegaly. Small pericardial effusion. Mediastinum/Nodes: Midline trachea. No thyroid mass. Mildly prominent mediastinal lymph nodes, lymph node adjacent to the aortic arch measures 1 cm. Left low paratracheal lymph node measures 1 cm. Subcarinal lymph node measures 15 mm. Esophagus within normal limits Lungs/Pleura: Lungs are clear. No pleural effusion or pneumothorax. Upper Abdomen: Vague 11 mm hypodensity in the posterior right hepatic lobe. No acute abnormality Musculoskeletal: Degenerative changes of the spine. No acute or suspicious bone lesion. Review of the MIP images confirms the above findings. IMPRESSION: 1. Negative for acute pulmonary embolus or aortic dissection 2. Clear lung fields 3. Mild nonspecific mediastinal adenopathy, no significant change compared to prior chest CT. 4. 11 mm hypodense lesion posterior right hepatic lobe. When the patient is clinically stable and able to follow directions and hold their breath (preferably as an outpatient) further evaluation with dedicated abdominal MRI should be considered. Electronically Signed   By: Donavan Foil M.D.    On: 07/21/2017 22:50   Out of network echocardiogram done in February 2018.   EKG: Independently reviewed.  Normal sinus rhythm Minimal voltage criteria for LVH, may be normal variant T wave abnormality, consider lateral ischemia Abnormal ECG  Assessment/Plan Principal Problem:   Chest pain The pain seems to be pleuritic. However, given cardiac risk factors, will trend troponin level. Analgesics as needed. EKG as needed for chest pain.  Her echo last year showed an EF of 50-55% and moderate AS. Check echocardiogram in a.m..  Active Problems:   Acute lower UTI Incomplete treatment due to GI side effects to Bactrim. Given history of low-grade temperatures, I will begin ceftriaxone 2 g IVPB every 24 hours. The patient stated that she has had cephalexin in the past without significant side effects.  Urine culture and sensitivity was sent from the ED.  Adjust therapy as needed.    Hypomagnesemia Replacing. Follow-up level as needed.    Hypertension Continue hydralazine 50 mg p.o. 3 times daily. Continue Avapro 300 mg p.o. daily. Continue labetalol 400 mg p.o. twice daily. Monitor blood pressure, heart rate, renal function and electrolytes.    Hypothyroidism Continue levothyroxine 100 mcg p.o. daily. Check TSH as needed    Hyperlipidemia Continue fenofibrate 160 mg p.o. daily. The patient is no longer taking pravastatin.    Gout Continue allopurinol 300 mg p.o. Daily.    Prediabetes Carbohydrate modified diet. Check hemoglobin A1c. CBG monitoring with regular insulin sliding scale while in the hospital.      DVT prophylaxis: Lovenox SQ. Code Status: Full code. Family Communication:  Disposition Plan: Observation/telemetry for troponin level cycling. Consults called: R Admission status: Observation/telemetry.   Reubin Milan MD [facility] Pager [phone].  If 7PM-7AM, please contact night-coverage [email or web address] Password [email or web address]  07/22/2017,  4:01 AM

## 2017-07-23 DIAGNOSIS — K219 Gastro-esophageal reflux disease without esophagitis: Secondary | ICD-10-CM

## 2017-07-23 DIAGNOSIS — Z6838 Body mass index (BMI) 38.0-38.9, adult: Secondary | ICD-10-CM

## 2017-07-23 DIAGNOSIS — E6609 Other obesity due to excess calories: Secondary | ICD-10-CM

## 2017-07-23 DIAGNOSIS — I10 Essential (primary) hypertension: Secondary | ICD-10-CM | POA: Diagnosis not present

## 2017-07-23 DIAGNOSIS — N390 Urinary tract infection, site not specified: Secondary | ICD-10-CM

## 2017-07-23 DIAGNOSIS — R7303 Prediabetes: Secondary | ICD-10-CM | POA: Diagnosis not present

## 2017-07-23 DIAGNOSIS — E039 Hypothyroidism, unspecified: Secondary | ICD-10-CM

## 2017-07-23 DIAGNOSIS — R079 Chest pain, unspecified: Secondary | ICD-10-CM

## 2017-07-23 DIAGNOSIS — M1A9XX0 Chronic gout, unspecified, without tophus (tophi): Secondary | ICD-10-CM

## 2017-07-23 LAB — GLUCOSE, CAPILLARY
GLUCOSE-CAPILLARY: 131 mg/dL — AB (ref 65–99)
Glucose-Capillary: 143 mg/dL — ABNORMAL HIGH (ref 65–99)

## 2017-07-23 LAB — HEMOGLOBIN A1C
HEMOGLOBIN A1C: 6.8 % — AB (ref 4.8–5.6)
MEAN PLASMA GLUCOSE: 148.46 mg/dL

## 2017-07-23 LAB — TROPONIN I
Troponin I: 0.03 ng/mL (ref <0.03)
Troponin I: <0.03 ng/mL (ref <0.03)

## 2017-07-23 MED ORDER — PANTOPRAZOLE SODIUM 40 MG PO TBEC: 40.0000 mg | DELAYED_RELEASE_TABLET | Freq: Every day | ORAL | 1 refills | Status: DC

## 2017-07-23 MED ORDER — CEFUROXIME AXETIL 500 MG PO TABS: 500.0000 mg | ORAL_TABLET | Freq: Two times a day (BID) | ORAL | 0 refills | Status: AC

## 2017-07-23 NOTE — Discharge Summary (Signed)
Physician Discharge Summary  Kathleen Vance OAC:[id number] DOB: 1950-12-18 DOA: 07/21/2017  PCP: Kathleen Squibb, MD  Admit date: 07/21/2017 Discharge date: 07/23/2017  Time spent: 35 minutes  Recommendations for Outpatient Follow-up:  Repeat BMET to follow electrolytes and renal function Follow resolution of UTI Please start treatment for diabetes (A1C 6.8); recommending metformin. Follow final urine cultures results.   Discharge Diagnoses:    Chest pain   Hypertension   Hypothyroidism   Hyperlipidemia   Gout   Type 2 diabetes   Hypomagnesemia   Acute lower Proteus mirabilis UTI   Gastroesophageal reflux disease   Class 2 obesity due to excess calories with body mass index (BMI) of 37.6 in adult   Discharge Condition: stable and improved. Discharge home with instruction to follow up with PCP and cardiology service as an outpatient.   Diet recommendation: low calorie, heart healthy and modified carbohydrates diet   Filed Weights   07/22/17 0106  Weight: 99.5 kg (219 lb 5.7 oz)    History of present illness:  As per Dr. Olevia Vance H&P written on 07/22/17 67 y.o. female with medical history significant of osteoarthritis, celiac artery stenosis, aortic stenosis, gout, hyperlipidemia, hypertension, hypothyroidism, prediabetes, renal artery stenosis who is coming to the emergency department with complaints of chest pain since about 1200 today, radiated to her back and left arm, not relieved by oral oxycodone and four 81 mg aspirin.  Per patient, she has not been feeling well lately.  She was being treated for an UTI since last week, but was unable to tolerate Bactrim due to diarrhea and GI upset.  She has been having low-grade temperatures since then and had an episode of night sweats 2 days ago.  She denies dysuria, frequency or hematuria, but she states that she does not get significant GU symptoms when she gets an UTI.    Around noontime yesterday, patient developed pleuritic chest  pain, pressure-stabbing like, radiated to her back and left arm associated with mild nausea, but no palpitations, dizziness or diaphoresis.  She denies PND, orthopnea or recent pitting edema of the lower extremities.  It worsens with inspiration, but she denies having cough, wheezing or hemoptysis.  She took some of her oxycodone, which did not relieve the pain significantly.  She took a nap and woke up due to chest pain.  She then took 481 mg aspirin, but has not had significant relief since then.  So she subsequently came to the ED.  Hospital Course:  1-chest pain: non cardiac -most likely secondary to GERD/esophgitis triggered with recent antibiotics -troponin flat elevation at 0.03 X 4, patient denies any further CP after PPI ordered -EKG and telemetry without ischemic changes   -2-D echo reassuring (no wall motion abnormalities and preserved EF. -given heart score 3-4 (base on risk factors), patient will follow up with cardiology service as an outpatient for decision on further stratification.  2-GERD -with risk factors (obesity) -also with recent GI upset symptoms while on antibiotics for UTI -improved with PPI -close follow up on her symptoms and if needed GI referral for endoscopy   3-HTN -resume home antihypertensive regimen  -advise to follow heart healthy diet   4-hypothyroidism Continue synthroid  5-type 2 diabetes -patient prior hx of pre-diabetes -A1C 6.8 -will benefit of starting hypoglycemic regimen; maybe initiation on metformin  -she would like to follow up with PCP prior to start any regimen   6-HLD -continue fenofibrate   7-Gout Continue allopurinol   8-proteus mirabilis UTI -Improvement in  her symptoms with rocephin  -discharge on ceftin BID -final cultures pending -no dysuria at discharge  9-obesity: class 2 Body mass index is 37.65 kg/m. -low calorie diet and exercise discussed/encouraged    Procedures:  See below for x-ray reports  2-D  echo  Consultations: Cardiology curbside (arranging outpatient follow up and further decisions to be made regarding need of stratification as an outpatient)  Discharge Exam: Vitals:   07/23/17 0551 07/23/17 0921  BP: (!) 107/54 111/61  Pulse: 68 64  Resp: 14   Temp: 98.8 F (37.1 C)   SpO2: 92%     General: afebrile, no CP, no SOB, no nausea, no vomiting and no dysuria. Cardiovascular: RRR, S1 and S2, no rubs, no gallops Respiratory: CTA bilaterally Abd: obese, no tenderness, no distension, positive BS Extremities: no edema or cyanosis   Discharge Instructions   Discharge Instructions    Diet - low sodium heart healthy   Complete by:  As directed    Discharge instructions   Complete by:  As directed    Keep yourself well-hydrated Take medications as prescribed Follow-up with cardiology as instructed Follow-up with primary care physician as already scheduled. Follow heart healthy and low calorie diet.     Allergies as of 07/23/2017      Reactions   Ciprofloxacin Anaphylaxis   Nitrofurantoin Anaphylaxis   Metronidazole    "sick"   Amoxicillin Rash   Erythromycin Rash      Medication List    STOP taking these medications   predniSONE 5 MG tablet Commonly known as:  DELTASONE     TAKE these medications   allopurinol 300 MG tablet Commonly known as:  ZYLOPRIM Take 300 mg daily by mouth.   aspirin 81 MG tablet Take 81 mg by mouth daily.   cefUROXime 500 MG tablet Commonly known as:  CEFTIN Take 1 tablet (500 mg total) by mouth 2 (two) times daily for 10 doses.   fenofibrate 160 MG tablet Commonly known as:  LOFIBRA Take 1 tablet (160 mg total) daily by mouth.   furosemide 40 MG tablet Commonly known as:  LASIX Take 1 tablet (40 mg total) by mouth 2 (two) times daily. For 3 days; then, reduce to 40 mg daily   gabapentin 300 MG capsule Commonly known as:  NEURONTIN Take 300 mg by mouth 2 (two) times daily.   hydrALAZINE 50 MG tablet Commonly known  as:  APRESOLINE Take 1 tablet (50 mg total) 3 (three) times daily by mouth.   irbesartan 300 MG tablet Commonly known as:  AVAPRO Take 1 tablet (300 mg total) daily by mouth.   labetalol 200 MG tablet Commonly known as:  NORMODYNE Take 400 mg by mouth 2 (two) times daily.   levothyroxine 100 MCG tablet Commonly known as:  SYNTHROID, LEVOTHROID Take 100 mcg by mouth daily before breakfast.   MEDROL (PAK) PO Take 1 tablet by mouth as needed.   pantoprazole 40 MG tablet Commonly known as:  PROTONIX Take 1 tablet (40 mg total) by mouth daily. Start taking on:  07/24/2017   PERCOCET 5-325 MG tablet Generic drug:  oxyCODONE-acetaminophen Take 1 tablet by mouth every 6 (six) hours as needed for severe pain.   pravastatin 20 MG tablet Commonly known as:  PRAVACHOL Take 20 mg by mouth daily.   Vitamin D (Ergocalciferol) 50000 units Caps capsule Commonly known as:  DRISDOL Take 50,000 Units every 7 (seven) days by mouth.      Allergies  Allergen Reactions  .  Ciprofloxacin Anaphylaxis  . Nitrofurantoin Anaphylaxis  . Metronidazole     "sick"  . Amoxicillin Rash  . Erythromycin Rash   Follow-up Information    Herminio Commons, MD Follow up on 07/31/2017.   Specialty:  Cardiology Why:  Cardiology Hospital Follow-Up on 07/31/2017 at 10:20AM. THIS IS AT THE [facility] OFFICE LOCATION.  Contact information: [place] [address] [phone]        Kathleen Squibb, MD. Go to.   Specialty:  Internal Medicine Why:  as previously scheduled  Contact information: [place] [facility] [address] [phone]           The results of significant diagnostics from this hospitalization (including imaging, microbiology, ancillary and laboratory) are listed below for reference.    Significant Diagnostic Studies: Dg Chest 2 View  Result Date: 07/21/2017 CLINICAL DATA:  Chest pain radiating to the back and left arm since lunch. EXAM: CHEST - 2 VIEW  COMPARISON:  03/26/2009 FINDINGS: Stable cardiomegaly with minimal aortic atherosclerosis. No effusion nor overt pulmonary edema. There is bibasilar atelectasis. No acute osseous abnormality. IMPRESSION: Stable cardiomegaly with aortic atherosclerosis. Bibasilar atelectasis is noted. No overt pulmonary edema, effusion or pneumothorax. Electronically Signed   By: Ashley Royalty M.D.   On: 07/21/2017 19:38   Ct Angio Chest Pe W/cm &/or Wo Cm  Result Date: 07/21/2017 CLINICAL DATA:  Chest pain with elevated D-dimer EXAM: CT ANGIOGRAPHY CHEST WITH CONTRAST TECHNIQUE: Multidetector CT imaging of the chest was performed using the standard protocol during bolus administration of intravenous contrast. Multiplanar CT image reconstructions and MIPs were obtained to evaluate the vascular anatomy. CONTRAST:  44mL ISOVUE-370 IOPAMIDOL (ISOVUE-370) INJECTION 76% COMPARISON:  Chest x-ray 07/21/2017, CT chest 08/11/2004 FINDINGS: Cardiovascular: Satisfactory opacification of the pulmonary arteries to the segmental level. No evidence of pulmonary embolism. Nonaneurysmal aorta. No dissection. Common origin of the left common carotid and right brachiocephalic artery from the arch. Mild cardiomegaly. Small pericardial effusion. Mediastinum/Nodes: Midline trachea. No thyroid mass. Mildly prominent mediastinal lymph nodes, lymph node adjacent to the aortic arch measures 1 cm. Left low paratracheal lymph node measures 1 cm. Subcarinal lymph node measures 15 mm. Esophagus within normal limits Lungs/Pleura: Lungs are clear. No pleural effusion or pneumothorax. Upper Abdomen: Vague 11 mm hypodensity in the posterior right hepatic lobe. No acute abnormality Musculoskeletal: Degenerative changes of the spine. No acute or suspicious bone lesion. Review of the MIP images confirms the above findings. IMPRESSION: 1. Negative for acute pulmonary embolus or aortic dissection 2. Clear lung fields 3. Mild nonspecific mediastinal adenopathy, no  significant change compared to prior chest CT. 4. 11 mm hypodense lesion posterior right hepatic lobe. When the patient is clinically stable and able to follow directions and hold their breath (preferably as an outpatient) further evaluation with dedicated abdominal MRI should be considered. Electronically Signed   By: Donavan Foil M.D.   On: 07/21/2017 22:50    Microbiology: Recent Results (from the past 240 hour(s))  Culture, Urine     Status: Abnormal (Preliminary result)   Collection Time: 07/21/17  9:24 PM  Result Value Ref Range Status   Specimen Description   Final    URINE, CLEAN CATCH Performed at [facility], [address] [place], [place] [address]    Special Requests   Final    NONE Performed at [facility], [address] [place], [place] [address]    Culture >=100,000 COLONIES/mL GRAM NEGATIVE RODS (A)  Final   Report  Status PENDING  Incomplete     Labs: Basic Metabolic Panel: Recent Labs  Lab 07/21/17 1958 07/21/17 2152  NA 137  --   K 4.1  --   CL 102  --   CO2 22  --   GLUCOSE 194*  --   BUN 21*  --   CREATININE 1.71*  --   CALCIUM 9.0  --   MG  --  1.5*   CBC: Recent Labs  Lab 07/21/17 1958 07/22/17 0923  WBC 16.1* 11.1*  NEUTROABS  --  7.6  HGB 11.5* 10.2*  HCT 37.8 34.5*  MCV 92.2 93.0  PLT 331 275   Cardiac Enzymes: Recent Labs  Lab 07/21/17 1958 07/21/17 2152 07/23/17 0446 07/23/17 0954  TROPONINI 0.03* 0.03* 0.03* <0.03   CBG: Recent Labs  Lab 07/22/17 1101 07/22/17 1611 07/22/17 2113 07/23/17 0800 07/23/17 1127  GLUCAP 164* 124* 140* 131* 143*    Signed:  Barton Dubois MD.  [facility] 07/23/2017, 12:49 PM

## 2017-07-23 NOTE — Progress Notes (Signed)
All discharge instructions given by student under my supervision

## 2017-07-23 NOTE — Progress Notes (Signed)
Discharge instructions read to patient.  Patient verbalized understanding.  Patient discharged home with husband.

## 2017-07-24 LAB — URINE CULTURE

## 2017-07-31 ENCOUNTER — Ambulatory Visit: Payer: Medicare Other | Admitting: Cardiovascular Disease

## 2017-07-31 ENCOUNTER — Encounter: Payer: Self-pay | Admitting: Cardiovascular Disease

## 2017-07-31 ENCOUNTER — Telehealth: Payer: Self-pay | Admitting: Cardiovascular Disease

## 2017-07-31 VITALS — BP 90/58 | HR 70 | Ht 64.0 in | Wt 225.0 lb

## 2017-07-31 DIAGNOSIS — I10 Essential (primary) hypertension: Secondary | ICD-10-CM | POA: Diagnosis not present

## 2017-07-31 DIAGNOSIS — R109 Unspecified abdominal pain: Secondary | ICD-10-CM

## 2017-07-31 DIAGNOSIS — I9589 Other hypotension: Secondary | ICD-10-CM

## 2017-07-31 DIAGNOSIS — K219 Gastro-esophageal reflux disease without esophagitis: Secondary | ICD-10-CM | POA: Diagnosis not present

## 2017-07-31 DIAGNOSIS — E782 Mixed hyperlipidemia: Secondary | ICD-10-CM

## 2017-07-31 MED ORDER — HYDRALAZINE HCL 50 MG PO TABS: ORAL_TABLET | ORAL | 6 refills | Status: DC

## 2017-07-31 NOTE — Patient Instructions (Addendum)
Your physician wants you to follow-up in:as needed with Dr.Koneswaran     Please schedule abdominal ultrasound, No food or drink 6 hours before test      HOLD hydralazine for low blood pressure        Thank you for choosing [facility] !

## 2017-07-31 NOTE — Telephone Encounter (Signed)
Please give Kathleen Vance from [place] a call @ [phone] concerning the pt's hydrALAZINE (APRESOLINE) 50 MG tablet [[id number]]

## 2017-07-31 NOTE — Progress Notes (Signed)
SUBJECTIVE: The patient presents for follow-up after recently being hospitalized for what appeared to be noncardiac chest pain and likely secondary to GERD/esophagitis triggered with recent antibiotic use.  Troponins were not significantly elevated.  She was started on a proton pump inhibitor.  HbA1c was found to be elevated at 6.8% indicative of type 2 diabetes mellitus.  I reviewed her echocardiogram performed on 07/22/17 which showed normal left ventricular systolic and diastolic function with normal regional wall motion, LVEF 60-65%.  There was a small mostly posterior pericardial effusion. Interestingly, there is no evidence of aortic stenosis.  She does have a history of malignant hypertension and renal artery stenosis.  I reviewed hospitalization records, labs, and studies.  She was diagnosed with a UTI and urine cultures demonstrated Proteus mirabilis.  She was started on Ceftin.  She is no longer having chest pain or pressure but does complain of exertional dyspnea.  Her chest x-ray showed no active disease and CT Joetta Manners the chest showed no evidence of pulmonary embolism.  There was an 11 mm hypodense lesion in the posterior hepatic lobe.  She does complain of abdominal swelling and leg swelling.  She has not been drinking or eating much.  She does not believe she has GERD and said she has never had heartburn.  She describes right lower quadrant abdominal pain.   Review of Systems: As per "subjective", otherwise negative.  Allergies  Allergen Reactions  . Bactrim [Sulfamethoxazole-Trimethoprim]     Nausea, diarrhea  . Ciprofloxacin Anaphylaxis  . Nitrofurantoin Anaphylaxis  . Metronidazole     "sick"  . Amoxicillin Rash  . Erythromycin Rash    Current Outpatient Medications  Medication Sig Dispense Refill  . allopurinol (ZYLOPRIM) 300 MG tablet Take 300 mg daily by mouth.    Marland Kitchen aspirin 81 MG tablet Take 81 mg by mouth daily.    . fenofibrate (LOFIBRA) 160 MG  tablet Take 1 tablet (160 mg total) daily by mouth.    . gabapentin (NEURONTIN) 300 MG capsule Take 300 mg by mouth 2 (two) times daily.     . hydrALAZINE (APRESOLINE) 50 MG tablet Take 1 tablet (50 mg total) 3 (three) times daily by mouth. 90 tablet 6  . irbesartan (AVAPRO) 300 MG tablet Take 1 tablet (300 mg total) daily by mouth.    . labetalol (NORMODYNE) 200 MG tablet Take 400 mg by mouth 2 (two) times daily.     Marland Kitchen levothyroxine (SYNTHROID, LEVOTHROID) 100 MCG tablet Take 100 mcg by mouth daily before breakfast.    . MethylPREDNISolone (MEDROL, PAK, PO) Take 1 tablet by mouth as needed.     Marland Kitchen oxyCODONE-acetaminophen (PERCOCET) 5-325 MG tablet Take 1 tablet by mouth every 6 (six) hours as needed for severe pain.     . pantoprazole (PROTONIX) 40 MG tablet Take 1 tablet (40 mg total) by mouth daily. 30 tablet 1  . pravastatin (PRAVACHOL) 20 MG tablet Take 20 mg by mouth daily.    . Vitamin D, Ergocalciferol, (DRISDOL) 50000 units CAPS capsule Take 50,000 Units every 7 (seven) days by mouth.    . furosemide (LASIX) 40 MG tablet Take 1 tablet (40 mg total) by mouth 2 (two) times daily. For 3 days; then, reduce to 40 mg daily (Patient not taking: Reported on 07/31/2017) 43 tablet 6   No current facility-administered medications for this visit.     Past Medical History:  Diagnosis Date  . Aortic stenosis   . Arthritis   .  Celiac artery stenosis ([place])   . Gout   . Hyperlipidemia   . Hypertension   . Hypothyroidism   . Prediabetes   . Renal artery stenosis [facility])     Past Surgical History:  Procedure Laterality Date  . ANKLE SURGERY Bilateral   . CATARACT EXTRACTION    . CESAREAN SECTION    . CHOLECYSTECTOMY    . PARTIAL HYSTERECTOMY    . REPAIR KNEE LIGAMENT      Social History   Socioeconomic History  . Marital status: Married    Spouse name: Not on file  . Number of children: Not on file  . Years of education: Not on file  . Highest education level: Not on file    Occupational History  . Not on file  Social Needs  . Financial resource strain: Not on file  . Food insecurity:    Worry: Not on file    Inability: Not on file  . Transportation needs:    Medical: Not on file    Non-medical: Not on file  Tobacco Use  . Smoking status: Never Smoker  . Smokeless tobacco: Never Used  Substance and Sexual Activity  . Alcohol use: Yes    Comment: rarely  . Drug use: Never  . Sexual activity: Not on file  Lifestyle  . Physical activity:    Days per week: Not on file    Minutes per session: Not on file  . Stress: Not on file  Relationships  . Social connections:    Talks on phone: Not on file    Gets together: Not on file    Attends religious service: Not on file    Active member of club or organization: Not on file    Attends meetings of clubs or organizations: Not on file    Relationship status: Not on file  . Intimate partner violence:    Fear of current or ex partner: Not on file    Emotionally abused: Not on file    Physically abused: Not on file    Forced sexual activity: Not on file  Other Topics Concern  . Not on file  Social History Narrative  . Not on file     Vitals:   07/31/17 0954  BP: (!) 90/58  Pulse: 70  SpO2: 92%  Weight: 225 lb (102.1 kg)  Height: 5\' 4"  (1.626 m)    Wt Readings from Last 3 Encounters:  07/31/17 225 lb (102.1 kg)  07/22/17 219 lb 5.7 oz (99.5 kg)  03/10/17 216 lb (98 kg)     PHYSICAL EXAM General: NAD HEENT: Normal. Neck: No JVD, no thyromegaly. Lungs: Clear to auscultation bilaterally with normal respiratory effort. CV: Regular rate and rhythm, normal S1/S2, no S3/S4, no murmur.  Trivial pretibial edema.     Abdomen: Soft, mild diffuse tenderness, mild distention. Neurologic: Alert and oriented.  Psych: Normal affect. Skin: Normal. Musculoskeletal: No gross deformities.    ECG: Most recent ECG reviewed.   Labs: Lab Results  Component Value Date/Time   K 4.1 07/21/2017 07:58  PM   BUN 21 (H) 07/21/2017 07:58 PM   CREATININE 1.71 (H) 07/21/2017 07:58 PM   HGB 10.2 (L) 07/22/2017 09:23 AM     Lipids: No results found for: LDLCALC, LDLDIRECT, CHOL, TRIG, HDL     ASSESSMENT AND PLAN: 1.  Malignant hypertension: She is also seeing a nephrologist (Dr. Posey Pronto with [facility]) in [place] for BP management.  Blood pressure is actually low.  I  will hold hydralazine for now.  2.  GERD: Continue Protonix 40 mg daily.  However, she denies GERD symptoms altogether and said she has never experienced this before.  3.  Hyperlipidemia: Continue Pravachol 20 mg daily.  4.  Abdominal pain: She may be experiencing pain from resolving UTI.  She finished her course of Ceftin.  There was a 11 mm hypodense lesion in the posterior hepatic lobe.  I will obtain an abdominal ultrasound.  She is scheduled to see her PCP in the near future.   Disposition: Follow up as needed  Time spent: 40 minutes, of which greater than 50% was spent reviewing symptoms, relevant blood tests and studies, and discussing management plan with the patient.    Kate Sable, M.D., F.A.C.C.

## 2017-07-31 NOTE — Telephone Encounter (Signed)
Medication is on HOLD due to HYPOTENSION per Dr Bronson Ing

## 2017-08-05 ENCOUNTER — Other Ambulatory Visit: Payer: Self-pay

## 2017-08-05 ENCOUNTER — Encounter (HOSPITAL_COMMUNITY): Payer: Self-pay | Admitting: Emergency Medicine

## 2017-08-05 ENCOUNTER — Other Ambulatory Visit (HOSPITAL_COMMUNITY): Payer: Self-pay | Admitting: Internal Medicine

## 2017-08-05 ENCOUNTER — Emergency Department (HOSPITAL_COMMUNITY): Payer: Medicare Other

## 2017-08-05 ENCOUNTER — Ambulatory Visit (HOSPITAL_COMMUNITY)
Admission: RE | Admit: 2017-08-05 | Discharge: 2017-08-05 | Disposition: A | Discharge status: Home / Self Care | Payer: Medicare Other | Source: Ambulatory Visit | Attending: Internal Medicine | Admitting: Internal Medicine

## 2017-08-05 ENCOUNTER — Inpatient Hospital Stay (HOSPITAL_COMMUNITY)
Admission: EM | Admit: 2017-08-05 | Discharge: 2017-08-14 | DRG: 193 | Disposition: A | Discharge status: Skilled Nursing Facility | Payer: Medicare Other | Attending: Internal Medicine | Admitting: Internal Medicine

## 2017-08-05 DIAGNOSIS — R0602 Shortness of breath: Secondary | ICD-10-CM | POA: Diagnosis not present

## 2017-08-05 DIAGNOSIS — K769 Liver disease, unspecified: Secondary | ICD-10-CM | POA: Diagnosis not present

## 2017-08-05 DIAGNOSIS — E874 Mixed disorder of acid-base balance: Secondary | ICD-10-CM | POA: Diagnosis not present

## 2017-08-05 DIAGNOSIS — I130 Hypertensive heart and chronic kidney disease with heart failure and stage 1 through stage 4 chronic kidney disease, or unspecified chronic kidney disease: Secondary | ICD-10-CM | POA: Diagnosis not present

## 2017-08-05 DIAGNOSIS — E872 Acidosis, unspecified: Secondary | ICD-10-CM

## 2017-08-05 DIAGNOSIS — R1011 Right upper quadrant pain: Secondary | ICD-10-CM

## 2017-08-05 DIAGNOSIS — J9601 Acute respiratory failure with hypoxia: Secondary | ICD-10-CM | POA: Diagnosis not present

## 2017-08-05 DIAGNOSIS — Z881 Allergy status to other antibiotic agents status: Secondary | ICD-10-CM

## 2017-08-05 DIAGNOSIS — M109 Gout, unspecified: Secondary | ICD-10-CM | POA: Diagnosis not present

## 2017-08-05 DIAGNOSIS — R079 Chest pain, unspecified: Secondary | ICD-10-CM | POA: Diagnosis not present

## 2017-08-05 DIAGNOSIS — J189 Pneumonia, unspecified organism: Secondary | ICD-10-CM | POA: Diagnosis present

## 2017-08-05 DIAGNOSIS — N189 Chronic kidney disease, unspecified: Secondary | ICD-10-CM | POA: Diagnosis not present

## 2017-08-05 DIAGNOSIS — E876 Hypokalemia: Secondary | ICD-10-CM | POA: Diagnosis not present

## 2017-08-05 DIAGNOSIS — J181 Lobar pneumonia, unspecified organism: Principal | ICD-10-CM | POA: Diagnosis present

## 2017-08-05 DIAGNOSIS — E785 Hyperlipidemia, unspecified: Secondary | ICD-10-CM | POA: Diagnosis not present

## 2017-08-05 DIAGNOSIS — E6609 Other obesity due to excess calories: Secondary | ICD-10-CM

## 2017-08-05 DIAGNOSIS — I350 Nonrheumatic aortic (valve) stenosis: Secondary | ICD-10-CM | POA: Diagnosis present

## 2017-08-05 DIAGNOSIS — I10 Essential (primary) hypertension: Secondary | ICD-10-CM | POA: Diagnosis present

## 2017-08-05 DIAGNOSIS — N183 Chronic kidney disease, stage 3 (moderate): Secondary | ICD-10-CM | POA: Diagnosis not present

## 2017-08-05 DIAGNOSIS — D649 Anemia, unspecified: Secondary | ICD-10-CM | POA: Diagnosis not present

## 2017-08-05 DIAGNOSIS — E782 Mixed hyperlipidemia: Secondary | ICD-10-CM | POA: Diagnosis not present

## 2017-08-05 DIAGNOSIS — J9622 Acute and chronic respiratory failure with hypercapnia: Secondary | ICD-10-CM | POA: Diagnosis not present

## 2017-08-05 DIAGNOSIS — M898X9 Other specified disorders of bone, unspecified site: Secondary | ICD-10-CM | POA: Diagnosis present

## 2017-08-05 DIAGNOSIS — E1140 Type 2 diabetes mellitus with diabetic neuropathy, unspecified: Secondary | ICD-10-CM | POA: Diagnosis not present

## 2017-08-05 DIAGNOSIS — N179 Acute kidney failure, unspecified: Secondary | ICD-10-CM

## 2017-08-05 DIAGNOSIS — K219 Gastro-esophageal reflux disease without esophagitis: Secondary | ICD-10-CM | POA: Diagnosis present

## 2017-08-05 DIAGNOSIS — M1A9XX0 Chronic gout, unspecified, without tophus (tophi): Secondary | ICD-10-CM

## 2017-08-05 DIAGNOSIS — R509 Fever, unspecified: Secondary | ICD-10-CM | POA: Diagnosis not present

## 2017-08-05 DIAGNOSIS — Z880 Allergy status to penicillin: Secondary | ICD-10-CM

## 2017-08-05 DIAGNOSIS — I701 Atherosclerosis of renal artery: Secondary | ICD-10-CM | POA: Diagnosis present

## 2017-08-05 DIAGNOSIS — R000 Tachycardia, unspecified: Secondary | ICD-10-CM | POA: Diagnosis present

## 2017-08-05 DIAGNOSIS — I5032 Chronic diastolic (congestive) heart failure: Secondary | ICD-10-CM | POA: Diagnosis present

## 2017-08-05 DIAGNOSIS — D72829 Elevated white blood cell count, unspecified: Secondary | ICD-10-CM | POA: Diagnosis not present

## 2017-08-05 DIAGNOSIS — E039 Hypothyroidism, unspecified: Secondary | ICD-10-CM | POA: Diagnosis not present

## 2017-08-05 DIAGNOSIS — N182 Chronic kidney disease, stage 2 (mild): Secondary | ICD-10-CM | POA: Diagnosis not present

## 2017-08-05 DIAGNOSIS — N184 Chronic kidney disease, stage 4 (severe): Secondary | ICD-10-CM | POA: Diagnosis not present

## 2017-08-05 DIAGNOSIS — Z8249 Family history of ischemic heart disease and other diseases of the circulatory system: Secondary | ICD-10-CM

## 2017-08-05 DIAGNOSIS — J9602 Acute respiratory failure with hypercapnia: Secondary | ICD-10-CM | POA: Diagnosis not present

## 2017-08-05 DIAGNOSIS — E875 Hyperkalemia: Secondary | ICD-10-CM | POA: Diagnosis not present

## 2017-08-05 DIAGNOSIS — Z833 Family history of diabetes mellitus: Secondary | ICD-10-CM

## 2017-08-05 DIAGNOSIS — M6281 Muscle weakness (generalized): Secondary | ICD-10-CM | POA: Diagnosis not present

## 2017-08-05 DIAGNOSIS — J9621 Acute and chronic respiratory failure with hypoxia: Secondary | ICD-10-CM | POA: Diagnosis not present

## 2017-08-05 DIAGNOSIS — E038 Other specified hypothyroidism: Secondary | ICD-10-CM | POA: Diagnosis not present

## 2017-08-05 DIAGNOSIS — R1012 Left upper quadrant pain: Secondary | ICD-10-CM | POA: Diagnosis not present

## 2017-08-05 DIAGNOSIS — Z6838 Body mass index (BMI) 38.0-38.9, adult: Secondary | ICD-10-CM | POA: Diagnosis not present

## 2017-08-05 DIAGNOSIS — Z7982 Long term (current) use of aspirin: Secondary | ICD-10-CM

## 2017-08-05 DIAGNOSIS — R160 Hepatomegaly, not elsewhere classified: Secondary | ICD-10-CM

## 2017-08-05 DIAGNOSIS — N170 Acute kidney failure with tubular necrosis: Secondary | ICD-10-CM | POA: Diagnosis not present

## 2017-08-05 DIAGNOSIS — I352 Nonrheumatic aortic (valve) stenosis with insufficiency: Secondary | ICD-10-CM | POA: Diagnosis not present

## 2017-08-05 DIAGNOSIS — E1122 Type 2 diabetes mellitus with diabetic chronic kidney disease: Secondary | ICD-10-CM | POA: Diagnosis present

## 2017-08-05 DIAGNOSIS — Y95 Nosocomial condition: Secondary | ICD-10-CM | POA: Diagnosis present

## 2017-08-05 DIAGNOSIS — R2689 Other abnormalities of gait and mobility: Secondary | ICD-10-CM | POA: Diagnosis not present

## 2017-08-05 DIAGNOSIS — R5383 Other fatigue: Secondary | ICD-10-CM | POA: Diagnosis not present

## 2017-08-05 DIAGNOSIS — M1000 Idiopathic gout, unspecified site: Secondary | ICD-10-CM | POA: Diagnosis not present

## 2017-08-05 DIAGNOSIS — N178 Other acute kidney failure: Secondary | ICD-10-CM | POA: Diagnosis not present

## 2017-08-05 LAB — COMPREHENSIVE METABOLIC PANEL
ALT: 12 U/L — ABNORMAL LOW (ref 14–54)
ANION GAP: 12 (ref 5–15)
AST: 19 U/L (ref 15–41)
Albumin: 3.4 g/dL — ABNORMAL LOW (ref 3.5–5.0)
Alkaline Phosphatase: 38 U/L (ref 38–126)
BILIRUBIN TOTAL: 0.8 mg/dL (ref 0.3–1.2)
BUN: 30 mg/dL — AB (ref 6–20)
CHLORIDE: 105 mmol/L (ref 101–111)
CO2: 24 mmol/L (ref 22–32)
Calcium: 9 mg/dL (ref 8.9–10.3)
Creatinine, Ser: 1.98 mg/dL — ABNORMAL HIGH (ref 0.44–1.00)
GFR calc Af Amer: 29 mL/min — ABNORMAL LOW (ref >60)
GFR, EST NON AFRICAN AMERICAN: 25 mL/min — AB (ref >60)
Glucose, Bld: 114 mg/dL — ABNORMAL HIGH (ref 65–99)
POTASSIUM: 4.4 mmol/L (ref 3.5–5.1)
Sodium: 141 mmol/L (ref 135–145)
TOTAL PROTEIN: 7.8 g/dL (ref 6.5–8.1)

## 2017-08-05 LAB — MAGNESIUM: Magnesium: 1.8 mg/dL (ref 1.7–2.4)

## 2017-08-05 LAB — LIPASE, BLOOD: LIPASE: 25 U/L (ref 11–51)

## 2017-08-05 LAB — INFLUENZA PANEL BY PCR (TYPE A & B)
INFLAPCR: NEGATIVE
Influenza B By PCR: NEGATIVE

## 2017-08-05 LAB — CBC
HEMATOCRIT: 32.8 % — AB (ref 36.0–46.0)
HEMOGLOBIN: 9.5 g/dL — AB (ref 12.0–15.0)
MCH: 26.5 pg (ref 26.0–34.0)
MCHC: 29 g/dL — AB (ref 30.0–36.0)
MCV: 91.6 fL (ref 78.0–100.0)
Platelets: 599 10*3/uL — ABNORMAL HIGH (ref 150–400)
RBC: 3.58 MIL/uL — ABNORMAL LOW (ref 3.87–5.11)
RDW: 15.5 % (ref 11.5–15.5)
WBC: 17.4 10*3/uL — ABNORMAL HIGH (ref 4.0–10.5)

## 2017-08-05 LAB — URINALYSIS, ROUTINE W REFLEX MICROSCOPIC
Bilirubin Urine: NEGATIVE
GLUCOSE, UA: NEGATIVE mg/dL
KETONES UR: NEGATIVE mg/dL
NITRITE: NEGATIVE
PH: 5 (ref 5.0–8.0)
Protein, ur: 100 mg/dL — AB
SPECIFIC GRAVITY, URINE: 1.017 (ref 1.005–1.030)

## 2017-08-05 LAB — BRAIN NATRIURETIC PEPTIDE: B NATRIURETIC PEPTIDE 5: 255 pg/mL — AB (ref 0.0–100.0)

## 2017-08-05 LAB — PHOSPHORUS: PHOSPHORUS: 3.9 mg/dL (ref 2.5–4.6)

## 2017-08-05 LAB — GLUCOSE, CAPILLARY
Glucose-Capillary: 101 mg/dL — ABNORMAL HIGH (ref 65–99)
Glucose-Capillary: 118 mg/dL — ABNORMAL HIGH (ref 65–99)

## 2017-08-05 LAB — STREP PNEUMONIAE URINARY ANTIGEN: Strep Pneumo Urinary Antigen: NEGATIVE

## 2017-08-05 LAB — TROPONIN I

## 2017-08-05 MED ORDER — INSULIN ASPART 100 UNIT/ML ~~LOC~~ SOLN: 0.0000 [IU] | Freq: Every day | SUBCUTANEOUS | Status: DC

## 2017-08-05 MED ORDER — VANCOMYCIN HCL 10 G IV SOLR
1250.0000 mg | INTRAVENOUS | Status: DC
  Administered 2017-08-06: 1250 mg via INTRAVENOUS
  Filled 2017-08-05 (×4): qty 1250

## 2017-08-05 MED ORDER — HEPARIN SODIUM (PORCINE) 5000 UNIT/ML IJ SOLN
5000.0000 [IU] | Freq: Three times a day (TID) | INTRAMUSCULAR | Status: DC
  Administered 2017-08-05 – 2017-08-14 (×25): 5000 [IU] via SUBCUTANEOUS
  Filled 2017-08-05 (×26): qty 1

## 2017-08-05 MED ORDER — VANCOMYCIN HCL IN DEXTROSE 1-5 GM/200ML-% IV SOLN
1000.0000 mg | Freq: Once | INTRAVENOUS | Status: AC
  Administered 2017-08-05: 1000 mg via INTRAVENOUS
  Filled 2017-08-05: qty 200

## 2017-08-05 MED ORDER — PANTOPRAZOLE SODIUM 40 MG PO TBEC
40.0000 mg | DELAYED_RELEASE_TABLET | Freq: Every day | ORAL | Status: DC
  Administered 2017-08-05 – 2017-08-09 (×5): 40 mg via ORAL
  Filled 2017-08-05 (×5): qty 1

## 2017-08-05 MED ORDER — INSULIN ASPART 100 UNIT/ML ~~LOC~~ SOLN
0.0000 [IU] | Freq: Three times a day (TID) | SUBCUTANEOUS | Status: DC
  Administered 2017-08-06: 2 [IU] via SUBCUTANEOUS
  Administered 2017-08-06 – 2017-08-14 (×13): 1 [IU] via SUBCUTANEOUS

## 2017-08-05 MED ORDER — LEVOTHYROXINE SODIUM 100 MCG PO TABS
100.0000 ug | ORAL_TABLET | Freq: Every day | ORAL | Status: DC
  Administered 2017-08-06 – 2017-08-14 (×9): 100 ug via ORAL
  Filled 2017-08-05 (×9): qty 1

## 2017-08-05 MED ORDER — VANCOMYCIN HCL 10 G IV SOLR
2000.0000 mg | Freq: Once | INTRAVENOUS | Status: DC
  Filled 2017-08-05: qty 2000

## 2017-08-05 MED ORDER — HYDRALAZINE HCL 25 MG PO TABS
25.0000 mg | ORAL_TABLET | Freq: Three times a day (TID) | ORAL | Status: DC
  Filled 2017-08-05 (×2): qty 1

## 2017-08-05 MED ORDER — GABAPENTIN 300 MG PO CAPS
300.0000 mg | ORAL_CAPSULE | Freq: Two times a day (BID) | ORAL | Status: DC
  Administered 2017-08-05 – 2017-08-14 (×18): 300 mg via ORAL
  Filled 2017-08-05 (×18): qty 1

## 2017-08-05 MED ORDER — ACETAMINOPHEN 650 MG RE SUPP: 650.0000 mg | Freq: Four times a day (QID) | RECTAL | Status: DC | PRN

## 2017-08-05 MED ORDER — LABETALOL HCL 200 MG PO TABS
400.0000 mg | ORAL_TABLET | Freq: Two times a day (BID) | ORAL | Status: DC
  Administered 2017-08-05 – 2017-08-07 (×4): 400 mg via ORAL
  Filled 2017-08-05 (×4): qty 2

## 2017-08-05 MED ORDER — FUROSEMIDE 20 MG PO TABS
20.0000 mg | ORAL_TABLET | Freq: Every day | ORAL | Status: DC
  Administered 2017-08-05 – 2017-08-06 (×2): 20 mg via ORAL
  Filled 2017-08-05 (×2): qty 1

## 2017-08-05 MED ORDER — OXYCODONE-ACETAMINOPHEN 5-325 MG PO TABS
1.0000 | ORAL_TABLET | Freq: Four times a day (QID) | ORAL | Status: DC | PRN
  Administered 2017-08-05 – 2017-08-07 (×5): 1 via ORAL
  Filled 2017-08-05 (×5): qty 1

## 2017-08-05 MED ORDER — PIPERACILLIN-TAZOBACTAM 3.375 G IVPB 30 MIN
3.3750 g | Freq: Once | INTRAVENOUS | Status: AC
  Administered 2017-08-05: 3.375 g via INTRAVENOUS
  Filled 2017-08-05: qty 50

## 2017-08-05 MED ORDER — ALBUTEROL SULFATE (2.5 MG/3ML) 0.083% IN NEBU
5.0000 mg | INHALATION_SOLUTION | Freq: Once | RESPIRATORY_TRACT | Status: AC
  Administered 2017-08-05: 5 mg via RESPIRATORY_TRACT
  Filled 2017-08-05: qty 6

## 2017-08-05 MED ORDER — AZTREONAM 2 G IJ SOLR
2.0000 g | Freq: Three times a day (TID) | INTRAMUSCULAR | Status: DC
  Administered 2017-08-05 – 2017-08-07 (×5): 2 g via INTRAVENOUS
  Filled 2017-08-05 (×6): qty 2

## 2017-08-05 MED ORDER — SODIUM CHLORIDE 0.9 % IV SOLN
INTRAVENOUS | Status: DC
  Administered 2017-08-05 – 2017-08-06 (×2): via INTRAVENOUS

## 2017-08-05 MED ORDER — FENOFIBRATE 160 MG PO TABS
160.0000 mg | ORAL_TABLET | Freq: Every day | ORAL | Status: DC
  Administered 2017-08-05 – 2017-08-14 (×10): 160 mg via ORAL
  Filled 2017-08-05 (×13): qty 1

## 2017-08-05 MED ORDER — PRAVASTATIN SODIUM 10 MG PO TABS
20.0000 mg | ORAL_TABLET | Freq: Every day | ORAL | Status: DC
  Administered 2017-08-05 – 2017-08-14 (×10): 20 mg via ORAL
  Filled 2017-08-05 (×10): qty 2

## 2017-08-05 MED ORDER — HEPARIN SODIUM (PORCINE) 5000 UNIT/ML IJ SOLN: 5000.0000 [IU] | Freq: Three times a day (TID) | INTRAMUSCULAR | Status: DC

## 2017-08-05 MED ORDER — ONDANSETRON HCL 4 MG/2ML IJ SOLN
4.0000 mg | Freq: Four times a day (QID) | INTRAMUSCULAR | Status: DC | PRN
  Administered 2017-08-05: 4 mg via INTRAVENOUS
  Filled 2017-08-05: qty 2

## 2017-08-05 MED ORDER — ASPIRIN 81 MG PO CHEW
81.0000 mg | CHEWABLE_TABLET | Freq: Every day | ORAL | Status: DC
  Administered 2017-08-05 – 2017-08-14 (×10): 81 mg via ORAL
  Filled 2017-08-05 (×10): qty 1

## 2017-08-05 MED ORDER — ACETAMINOPHEN 325 MG PO TABS: 650.0000 mg | ORAL_TABLET | Freq: Four times a day (QID) | ORAL | Status: DC | PRN

## 2017-08-05 NOTE — ED Triage Notes (Signed)
Pt c/o of sob x2 weeks with right rib cage pain.  Was in MRI today but unable to perform due to sob.

## 2017-08-05 NOTE — ED Provider Notes (Signed)
[facility] EMERGENCY DEPARTMENT Provider Note   CSN: [id number] Arrival date & time: 08/05/17  1038     History   Chief Complaint Chief Complaint  Patient presents with  . Shortness of Breath    HPI Kathleen Vance is a 67 y.o. female.  HPI Patient presents with 2 weeks of ongoing shortness of breath.  Shortness of breath is worse with lying flat and exertion.  Was attempting to get MRI of the abdomen for liver lesion today but became short of breath while lying down.  States she has ongoing right upper quadrant abdominal pain which is worse with deep breathing.  She states she has had decreased p.o. intake due to nausea.  She denies cough but admits to recent fever.  Denies urinary symptoms.  Has ongoing lower extremity swelling.  Saw her cardiologist several days ago. Past Medical History:  Diagnosis Date  . Aortic stenosis   . Arthritis   . Celiac artery stenosis ([place])   . Gout   . Hyperlipidemia   . Hypertension   . Hypothyroidism   . Prediabetes   . Renal artery stenosis [facility])     Patient Active Problem List   Diagnosis Date Noted  . HCAP (healthcare-associated pneumonia) 08/05/2017  . Gastroesophageal reflux disease   . Class 2 obesity due to excess calories with body mass index (BMI) of 38.0 to 38.9 in adult   . Chest pain 07/22/2017  . Hypertension 07/22/2017  . Hypothyroidism 07/22/2017  . Hyperlipidemia 07/22/2017  . Gout 07/22/2017  . Prediabetes 07/22/2017  . Hypomagnesemia 07/22/2017  . Acute lower UTI 07/22/2017    Past Surgical History:  Procedure Laterality Date  . ANKLE SURGERY Bilateral   . CATARACT EXTRACTION    . CESAREAN SECTION    . CHOLECYSTECTOMY    . PARTIAL HYSTERECTOMY    . REPAIR KNEE LIGAMENT       OB History   None      Home Medications    Prior to Admission medications   Medication Sig Start Date End Date Taking? Authorizing Provider  allopurinol (ZYLOPRIM) 300 MG tablet Take 300 mg daily by mouth.   Yes [provider]  aspirin 81 MG tablet Take 81 mg by mouth daily.   Yes [provider]  fenofibrate (LOFIBRA) 160 MG tablet Take 1 tablet (160 mg total) daily by mouth. 03/12/17  Yes Herminio Commons, MD  gabapentin (NEURONTIN) 300 MG capsule Take 300 mg by mouth 2 (two) times daily.    Yes [provider]  labetalol (NORMODYNE) 200 MG tablet Take 400 mg by mouth 2 (two) times daily.    Yes [provider]  levothyroxine (SYNTHROID, LEVOTHROID) 100 MCG tablet Take 100 mcg by mouth daily before breakfast.   Yes [provider]  oxyCODONE-acetaminophen (PERCOCET) 5-325 MG tablet Take 1 tablet by mouth every 6 (six) hours as needed for severe pain.    Yes [provider]  pantoprazole (PROTONIX) 40 MG tablet Take 1 tablet (40 mg total) by mouth daily. 07/24/17  Yes Barton Dubois, MD  pravastatin (PRAVACHOL) 20 MG tablet Take 20 mg by mouth daily.   Yes [provider]  furosemide (LASIX) 40 MG tablet Take 1 tablet (40 mg total) by mouth 2 (two) times daily. For 3 days; then, reduce to 40 mg daily Patient not taking: Reported on 07/31/2017 09/11/16   Herminio Commons, MD  hydrALAZINE (APRESOLINE) 50 MG tablet 07/31/17 on HOLD for HYPOTENSION per Dr Bronson Ing Patient  not taking: Reported on 08/05/2017 07/31/17   Herminio Commons, MD  MethylPREDNISolone (MEDROL, PAK, PO) Take 1 tablet by mouth as needed.     [provider]  Vitamin D, Ergocalciferol, (DRISDOL) 50000 units CAPS capsule Take 50,000 Units every 7 (seven) days by mouth.    [provider]    Family History Family History  Problem Relation Age of Onset  . CAD Mother   . Heart attack Mother   . CAD Father   . Pneumonia Sister   . Other Brother        killed at age 55  . CAD Maternal Grandmother   . Heart attack Maternal Grandmother   . Diabetes Maternal Grandfather   . Other Paternal Grandmother        died after child birth  . Heart attack Paternal  Grandfather     Social History Social History   Tobacco Use  . Smoking status: Never Smoker  . Smokeless tobacco: Never Used  Substance Use Topics  . Alcohol use: Yes    Comment: rarely  . Drug use: Never     Allergies   Bactrim [sulfamethoxazole-trimethoprim]; Ciprofloxacin; Nitrofurantoin; Metronidazole; Amoxicillin; and Erythromycin   Review of Systems Review of Systems  Constitutional: Positive for appetite change, chills, fatigue and fever.  HENT: Negative for sore throat and trouble swallowing.   Eyes: Negative for visual disturbance.  Respiratory: Positive for shortness of breath. Negative for cough, chest tightness and wheezing.   Cardiovascular: Positive for leg swelling. Negative for chest pain and palpitations.  Gastrointestinal: Positive for abdominal distention, abdominal pain, diarrhea and nausea. Negative for constipation and vomiting.  Genitourinary: Negative for dysuria, flank pain and frequency.  Musculoskeletal: Negative for back pain, myalgias and neck pain.  Skin: Negative for rash and wound.  Neurological: Negative for dizziness, weakness, light-headedness, numbness and headaches.  All other systems reviewed and are negative.    Physical Exam Updated Vital Signs BP (!) 171/93   Pulse 87   Temp 99.3 F (37.4 C) (Oral)   Resp 14   Ht 5\' 4"  (1.626 m)   Wt 102.1 kg (225 lb)   SpO2 97%   BMI 38.62 kg/m   Physical Exam  Constitutional: She is oriented to person, place, and time. She appears well-developed and well-nourished.  Anxious appearing  HENT:  Head: Normocephalic and atraumatic.  Mouth/Throat: Oropharynx is clear and moist. No oropharyngeal exudate.  Eyes: Pupils are equal, round, and reactive to light. EOM are normal.  Neck: Normal range of motion. Neck supple. No JVD present.  Cardiovascular: Normal rate and regular rhythm. Exam reveals no gallop and no friction rub.  No murmur heard. Pulmonary/Chest:  Increased work of breathing.   Mildly diminished breath sounds throughout.  Question rhonchi in bilateral bases.  Abdominal: Soft. Bowel sounds are normal. There is tenderness. There is no rebound and no guarding.  Right upper quadrant tenderness to palpation.  No rebound or guarding.  Musculoskeletal: Normal range of motion. She exhibits edema. She exhibits no tenderness.  2+ bilateral lower extremity pitting edema.  No asymmetry or calf tenderness.  Distal pulses 2+.  Lymphadenopathy:    She has no cervical adenopathy.  Neurological: She is alert and oriented to person, place, and time.  Moves all extremities without focal deficit.  Sensation intact.  Skin: Skin is warm and dry. Capillary refill takes less than 2 seconds. No rash noted. She is not diaphoretic. No erythema.  Nursing note and vitals reviewed.    ED  Treatments / Results  Labs (all labs ordered are listed, but only abnormal results are displayed) Labs Reviewed  CBC - Abnormal; Notable for the following components:      Result Value   WBC 17.4 (*)    RBC 3.58 (*)    Hemoglobin 9.5 (*)    HCT 32.8 (*)    MCHC 29.0 (*)    Platelets 599 (*)    All other components within normal limits  COMPREHENSIVE METABOLIC PANEL - Abnormal; Notable for the following components:   Glucose, Bld 114 (*)    BUN 30 (*)    Creatinine, Ser 1.98 (*)    Albumin 3.4 (*)    ALT 12 (*)    GFR calc non Af Amer 25 (*)    GFR calc Af Amer 29 (*)    All other components within normal limits  BRAIN NATRIURETIC PEPTIDE - Abnormal; Notable for the following components:   B Natriuretic Peptide 255.0 (*)    All other components within normal limits  TROPONIN I  LIPASE, BLOOD  URINALYSIS, ROUTINE W REFLEX MICROSCOPIC    EKG EKG Interpretation  Date/Time:  Wednesday August 05 2017 10:48:37 EDT Ventricular Rate:  95 PR Interval:    QRS Duration: 97 QT Interval:  419 QTC Calculation: 527 R Axis:   27 Text Interpretation:  Sinus rhythm Low voltage, precordial leads  Nonspecific T abnormalities, lateral leads Prolonged QT interval Baseline wander in lead(s) III aVF V2 V4 Confirmed by Julianne Rice [phone]) on 08/05/2017 11:09:24 AM   Radiology Dg Chest 2 View  Result Date: 08/05/2017 CLINICAL DATA:  Shortness of breath and right chest pain for 2 weeks. EXAM: CHEST - 2 VIEW COMPARISON:  CT chest and PA and lateral chest 07/21/2016. FINDINGS: The patient has a new moderate left pleural effusion with basilar airspace disease. Right lung is clear. No pneumothorax. There is cardiomegaly. Atherosclerosis noted. No acute bony abnormality. IMPRESSION: Moderate left pleural effusion and basilar airspace disease are new since the most recent examination. Airspace disease could be due to atelectasis or infection. Cardiomegaly without edema. Electronically Signed   By: Inge Rise M.D.   On: 08/05/2017 11:49    Procedures Procedures (including critical care time)  Medications Ordered in ED Medications  vancomycin (VANCOCIN) IVPB 1000 mg/200 mL premix (1,000 mg Intravenous New Bag/Given 08/05/17 1254)  albuterol (PROVENTIL) (2.5 MG/3ML) 0.083% nebulizer solution 5 mg (5 mg Nebulization Given 08/05/17 1109)  piperacillin-tazobactam (ZOSYN) IVPB 3.375 g (3.375 g Intravenous New Bag/Given 08/05/17 1225)     Initial Impression / Assessment and Plan / ED Course  I have reviewed the triage vital signs and the nursing notes.  Pertinent labs & imaging results that were available during my care of the patient were reviewed by me and considered in my medical decision making (see chart for details).     Left lower lobe infiltrate and effusion.  Patient has elevation in her white blood cell count.  Consistent with pneumonia.  Given recent hospitalization will cover her with broad-spectrum antibiotics.  Discussed with hospitalist who will admit.  Final Clinical Impressions(s) / ED Diagnoses   Final diagnoses:  HCAP (healthcare-associated pneumonia)    ED Discharge Orders     None       Julianne Rice, MD 08/05/17 1257

## 2017-08-05 NOTE — ED Notes (Signed)
Pt unable to provide urine specimen at this time

## 2017-08-05 NOTE — ED Notes (Signed)
Respiratory paged at this time for tx.  

## 2017-08-05 NOTE — ED Notes (Signed)
Verified with MD Yelverton at this time that no blood cultures or lactic acid needed prior to start of antibx.

## 2017-08-05 NOTE — Progress Notes (Signed)
Pharmacy Antibiotic Note  Kathleen Vance is a 67 y.o. female admitted on 08/05/2017 with pneumonia.  Pharmacy has been consulted for Vancomycin dosing. Patient is also on aztreonam.  Plan: Vancomycin 2000 mg IV x 1 dose Vancomycin 1250 mg IV every 24 hours. Goal trough 15-20 mcg/mL. Aztreonam 2000 mg IV q8h hours Monitor labs, c/s, and vanco trough as indicated.   Height: 5\' 4"  (162.6 cm) Weight: 225 lb (102.1 kg) IBW/kg (Calculated) : 54.7  Temp (24hrs), Avg:99 F (37.2 C), Min:98.7 F (37.1 C), Max:99.3 F (37.4 C)  Recent Labs  Lab 08/05/17 1053  WBC 17.4*  CREATININE 1.98*    Estimated Creatinine Clearance: 32.1 mL/min (A) (by C-G formula based on SCr of 1.98 mg/dL (H)).    Allergies  Allergen Reactions  . Bactrim [Sulfamethoxazole-Trimethoprim]     Nausea, diarrhea  . Ciprofloxacin Anaphylaxis  . Nitrofurantoin Anaphylaxis  . Metronidazole     "sick"  . Amoxicillin Rash  . Erythromycin Rash    Antimicrobials this admission: Aztreonam 4/3 >>  Vancomycin 4/3>>  Dose adjustments this admission: N/A  Microbiology results: 4/3 Sputum: pending    Thank you for allowing pharmacy to be a part of this patient's care.   Margot Ables, PharmD Clinical Pharmacist 08/05/2017 4:37 PM

## 2017-08-05 NOTE — H&P (Addendum)
History and Physical    Kathleen Vance FBP:[id number] DOB: 12-18-1950 DOA: 08/05/2017  PCP: Celene Squibb, MD   I have briefly reviewed patients previous medical reports in [facility].  Patient coming from: Home  Chief Complaint: Shortness of breath, fever, general malaise.  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  Patient reports that for the last 2-3 days she has been experiencing shortness of breath on exertion and also at rest, express having chills and measured temperature up to 102 the night prior to admission.  She denies any chest pain, reports some nausea and inability to properly eat and drink; she has also noticed an increased swelling in her lower extremities. No hematemesis, no hematochezia, no melena, no dysuria, no hematuria, no headaches, no hemoptysis, no focal weakness or neurologic deficits.  ED Course: Chest x-ray demonstrated bilateral opacities suggesting pneumonia, patient with elevated WBCs and low-grade temperature.  She was requiring oxygen supplementation due to high 80s O2 sat on room air.  Blood cultures and urine culture were taken, patient receive IV fluids and started on IV antibiotics for healthcare associated pneumonia.  Review of Systems:  All other systems reviewed and apart from HPI, are negative.  Past Medical History:  Diagnosis Date  . Aortic stenosis   . Arthritis   . Celiac artery stenosis ([place])   . Gout   . Hyperlipidemia   . Hypertension   . Hypothyroidism   . Prediabetes   . Renal artery stenosis [facility])     Past Surgical History:  Procedure Laterality Date   . ANKLE SURGERY Bilateral   . CATARACT EXTRACTION    . CESAREAN SECTION    . CHOLECYSTECTOMY    . PARTIAL HYSTERECTOMY    . REPAIR KNEE LIGAMENT      Social History  reports that she has never smoked. She has never used smokeless tobacco. She reports that she drinks alcohol. She reports that she does not use drugs.  Allergies  Allergen Reactions  . Bactrim [Sulfamethoxazole-Trimethoprim]     Nausea, diarrhea  . Ciprofloxacin Anaphylaxis  . Nitrofurantoin Anaphylaxis  . Metronidazole     "sick"  . Amoxicillin Rash  . Erythromycin Rash    Family History  Problem Relation Age of Onset  . CAD Mother   . Heart attack Mother   . CAD Father   . Pneumonia Sister   . Other Brother        killed at age 55  . CAD Maternal Grandmother   . Heart attack Maternal Grandmother   . Diabetes Maternal Grandfather   . Other Paternal Grandmother        died after child birth  . Heart attack Paternal Grandfather     Prior to Admission medications   Medication Sig Start Date End Date Taking? Authorizing Provider  allopurinol (ZYLOPRIM) 300 MG tablet Take 300 mg daily by mouth.   Yes [provider]  aspirin 81 MG tablet Take 81 mg by mouth daily.   Yes [provider]  fenofibrate (LOFIBRA) 160 MG tablet Take 1 tablet (160 mg total) daily by mouth. 03/12/17  Yes Herminio Commons, MD  gabapentin (NEURONTIN) 300 MG capsule Take 300 mg  by mouth 2 (two) times daily.    Yes [provider]  labetalol (NORMODYNE) 200 MG tablet Take 400 mg by mouth 2 (two) times daily.    Yes [provider]  levothyroxine (SYNTHROID, LEVOTHROID) 100 MCG tablet Take 100 mcg by mouth daily before breakfast.   Yes [provider]  oxyCODONE-acetaminophen (PERCOCET) 5-325 MG tablet Take 1 tablet by mouth every 6 (six) hours as needed for severe pain.    Yes [provider]  pantoprazole (PROTONIX) 40 MG tablet Take 1 tablet (40 mg total) by mouth daily. 07/24/17   Yes Barton Dubois, MD  pravastatin (PRAVACHOL) 20 MG tablet Take 20 mg by mouth daily.   Yes [provider]  furosemide (LASIX) 40 MG tablet Take 1 tablet (40 mg total) by mouth 2 (two) times daily. For 3 days; then, reduce to 40 mg daily Patient not taking: Reported on 07/31/2017 09/11/16   Herminio Commons, MD  hydrALAZINE (APRESOLINE) 50 MG tablet 07/31/17 on HOLD for HYPOTENSION per Dr Bronson Ing Patient not taking: Reported on 08/05/2017 07/31/17   Herminio Commons, MD  MethylPREDNISolone (MEDROL, PAK, PO) Take 1 tablet by mouth as needed.     [provider]  Vitamin D, Ergocalciferol, (DRISDOL) 50000 units CAPS capsule Take 50,000 Units every 7 (seven) days by mouth.    [provider]    Physical Exam: Vitals:   08/05/17 1112 08/05/17 1200 08/05/17 1300 08/05/17 1334  BP:  (!) 171/93 (!) 169/100 (!) 191/109  Pulse:  87 85 96  Resp:  14 14 20   Temp:    98.7 F (37.1 C)  TempSrc:    Oral  SpO2: 95% 97% 100% 93%  Weight:    102.1 kg (225 lb)  Height:    5\' 4"  (1.626 m)    Constitutional: afebrile currently, no chest pain, patient reports shortness of breath and feeling nauseated; no using accessory muscles and able to speak in full sentences.  Eyes: PERTLA, lids and conjunctivae normal ENMT: Mucous membranes are moist. Posterior pharynx clear of any exudate or lesions. Normal dentition.  Neck: supple, no masses, no thyromegaly, no JVD Respiratory: clear to auscultation bilaterally, no wheezing, no crackles. Normal respiratory effort. No accessory muscle use. Positive rhonchi. Cardiovascular: S1 & S2 heard, regular rate and rhythm, no murmurs / rubs / gallops. 1 plus extremity edema bilaterally. 2+ pedal pulses. No carotid bruits.  Abdomen: Obese, No distension, no tenderness, no masses palpated. No hepatosplenomegaly. Bowel sounds normal.  Musculoskeletal: no clubbing / cyanosis. No joint deformity upper and lower extremities. Good ROM, no  contractures. Normal muscle tone.  Skin: no rashes, lesions, ulcers. No induration Neurologic: CN 2-12 grossly intact. Sensation intact, DTR normal. Strength 5/5 in all 4 limbs.  Psychiatric: Normal judgment and insight. Alert and oriented x 3. Normal mood.    Labs on Admission: I have personally reviewed following labs and imaging studies  CBC: Recent Labs  Lab 08/05/17 1053  WBC 17.4*  HGB 9.5*  HCT 32.8*  MCV 91.6  PLT 578*   Basic Metabolic Panel: Recent Labs  Lab 08/05/17 1053  NA 141  K 4.4  CL 105  CO2 24  GLUCOSE 114*  BUN 30*  CREATININE 1.98*  CALCIUM 9.0   Liver Function Tests: Recent Labs  Lab 08/05/17 1053  AST 19  ALT 12*  ALKPHOS 38  BILITOT 0.8  PROT 7.8  ALBUMIN 3.4*   Cardiac Enzymes: Recent Labs  Lab 08/05/17 1053  TROPONINI <0.03  Urine analysis:    Component Value Date/Time   COLORURINE YELLOW 07/21/2017 2124   APPEARANCEUR HAZY (A) 07/21/2017 2124   LABSPEC 1.018 07/21/2017 2124   PHURINE 7.0 07/21/2017 2124   GLUCOSEU NEGATIVE 07/21/2017 2124   HGBUR SMALL (A) 07/21/2017 2124   BILIRUBINUR NEGATIVE 07/21/2017 2124   [place] NEGATIVE 07/21/2017 2124   PROTEINUR 100 (A) 07/21/2017 2124   NITRITE NEGATIVE 07/21/2017 2124   LEUKOCYTESUR LARGE (A) 07/21/2017 2124    Radiological Exams on Admission: Dg Chest 2 View  Result Date: 08/05/2017 CLINICAL DATA:  Shortness of breath and right chest pain for 2 weeks. EXAM: CHEST - 2 VIEW COMPARISON:  CT chest and PA and lateral chest 07/21/2016. FINDINGS: The patient has a new moderate left pleural effusion with basilar airspace disease. Right lung is clear. No pneumothorax. There is cardiomegaly. Atherosclerosis noted. No acute bony abnormality. IMPRESSION: Moderate left pleural effusion and basilar airspace disease are new since the most recent examination. Airspace disease could be due to atelectasis or infection. Cardiomegaly without edema. Electronically Signed   By: Inge Rise  M.D.   On: 08/05/2017 11:49    EKG: Sinus rhythm, non specific T waves abnormalities, normal sinus.  Assessment/Plan 1-HCAP (healthcare-associated pneumonia) -with mild hypoxia  -CXR with infiltrates  -will admit to med-surg -Base on PNA protocol (will check sputum culture, blood culture, urine culture, strep pneumoniae and legionella antigen) -given recent hospital admission will start patient on vanc and aztreonam for HCAP (patient allergic to amoxicillin) -will use mucinex and flutter valve -gentle IVF's given -follow clinical response -will check influenza panel  2-Hypertension -BP elevated currently -will resume home medications  -follow BP trend and adjust antihypertensive   3-Hypothyroidism -will continue synthroid   4-Hyperlipidemia -continue pravachol   5-Gout -no acute flare -patient was using allopurinol PRN at home -will monitor off meds for now  6-Gastroesophageal reflux disease -continue PPI  7-Class 2 obesity due to excess calories with body mass index (BMI) of 38.0 to 38.9 in adult -low calorie diet recommended -Body mass index is 38.62 kg/m.  8-Liver lesion -outpatient follow up with PCP and MRI  9-Acute renal failure superimposed on stage 2 chronic kidney disease ([place]) -will give gentle IVF's -check UA -follow renal function trend   10-type 2 DM: -recent A1C 6.8 -while inpatient will use SSI   Time: 70 minutes   DVT prophylaxis: Heparin Code Status: Full code Family Communication:   Disposition Plan: Anticipate discharge back home 1 pneumonia resolved and breathing stabilized. Consults called: None Admission status: Inpatient, length of stay more than 2 midnights; MedSurg bed.   Barton Dubois MD [facility] Pager [phone]  If 7PM-7AM, please contact night-coverage [email or web address] Password [email or web address]  08/05/2017, 4:18 PM

## 2017-08-06 ENCOUNTER — Ambulatory Visit (HOSPITAL_COMMUNITY): Admission: RE | Admit: 2017-08-06 | Payer: Medicare Other | Source: Ambulatory Visit

## 2017-08-06 DIAGNOSIS — J9601 Acute respiratory failure with hypoxia: Secondary | ICD-10-CM

## 2017-08-06 LAB — CBC
HCT: 29 % — ABNORMAL LOW (ref 36.0–46.0)
HEMOGLOBIN: 8.5 g/dL — AB (ref 12.0–15.0)
MCH: 27 pg (ref 26.0–34.0)
MCHC: 29.3 g/dL — ABNORMAL LOW (ref 30.0–36.0)
MCV: 92.1 fL (ref 78.0–100.0)
Platelets: 498 10*3/uL — ABNORMAL HIGH (ref 150–400)
RBC: 3.15 MIL/uL — AB (ref 3.87–5.11)
RDW: 15.6 % — ABNORMAL HIGH (ref 11.5–15.5)
WBC: 13.6 10*3/uL — AB (ref 4.0–10.5)

## 2017-08-06 LAB — BASIC METABOLIC PANEL
ANION GAP: 11 (ref 5–15)
BUN: 31 mg/dL — AB (ref 6–20)
CO2: 26 mmol/L (ref 22–32)
Calcium: 8.4 mg/dL — ABNORMAL LOW (ref 8.9–10.3)
Chloride: 103 mmol/L (ref 101–111)
Creatinine, Ser: 2.11 mg/dL — ABNORMAL HIGH (ref 0.44–1.00)
GFR calc Af Amer: 27 mL/min — ABNORMAL LOW (ref >60)
GFR calc non Af Amer: 23 mL/min — ABNORMAL LOW (ref >60)
GLUCOSE: 148 mg/dL — AB (ref 65–99)
POTASSIUM: 5 mmol/L (ref 3.5–5.1)
Sodium: 140 mmol/L (ref 135–145)

## 2017-08-06 LAB — GLUCOSE, CAPILLARY
GLUCOSE-CAPILLARY: 67 mg/dL (ref 65–99)
GLUCOSE-CAPILLARY: 127 mg/dL — AB (ref 65–99)
Glucose-Capillary: 152 mg/dL — ABNORMAL HIGH (ref 65–99)
Glucose-Capillary: 121 mg/dL — ABNORMAL HIGH (ref 65–99)
Glucose-Capillary: 164 mg/dL — ABNORMAL HIGH (ref 65–99)

## 2017-08-06 LAB — HIV ANTIBODY (ROUTINE TESTING W REFLEX): HIV Screen 4th Generation wRfx: NONREACTIVE

## 2017-08-06 NOTE — Progress Notes (Signed)
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  Patient reports that for the last 2-3 days she has been experiencing shortness of breath on exertion and also at rest, express having chills and measured temperature up to 102 the night prior to admission.  She denies any chest pain, reports some nausea and inability to properly eat and drink; she has also noticed an increased swelling in her lower extremities. No hematemesis, no hematochezia, no melena, no dysuria, no hematuria, no headaches, no hemoptysis, no focal weakness or neurologic deficits  Assessment/Plan: 1-HCAP (healthcare-associated pneumonia) -follow blood cultures results, urine culture, strep pneumo and Legionella antigen in urine, sputum culture.   -continue current IV antibiotics (vancomycin and aztreonam) -continue oxygen supplementation and weaned as tolerated  -influenza panel neg -Follow clinical response.  2-Hypertension -Blood pressure improved with the use of antihypertensive agents  -heart healthy diet ordered  -follow VS and further adjust regimen as needed   3-Hypothyroidism -Continue Synthroid.   4-Hyperlipidemia -Continue statins.    5-Gout -No acute flare -Continue as needed allopurinol as previously taken.    6-Gastroesophageal reflux disease -no nausea and no heartburn reported -will continue PPI  7-Class 2 obesity due to excess calories with body mass index (BMI) of 38.0 to 38.9 in adult -Low calorie diet has  been discussed with the patient along with increase physical activity. -Body mass index is 37.99 kg/m.   8-Liver lesion -continue outpatient workup by PCP and follow-up with MRI   9-Acute renal failure superimposed on stage 2 chronic kidney disease ([place]) -Gentle IV fluids has been given -Urinalysis and no show acute infection -Will hold Lasix overnight -Follow renal function trend -GFR remains stable, even Cr went up some -advise to keep herself well hydrated PO.  10-type 2 DM: -A1c 6.8 -While inpatient will continue using sliding scale insulin -Patient need to have initiation of hypoglycemic regimen as an outpatient.   Code Status: full code  Family Communication: husband at bedside  Disposition Plan: Remains inpatient, continue current IV antibiotics, follow renal function, wean off oxygen supplementation as tolerated.  Consultants:  None  Procedures:  See below for x-ray reports.  Antibiotics:  Vancomycin and aztreonam  08/05/17  HPI/Subjective: Currently afebrile, but having low-grade temperature overnight.  She is feeling slightly better even is still has some shortness of breath and is requiring oxygen supplementation.  Patient describes no further episode of nausea vomiting and is looking to have her diet advanced.    Objective: Vitals:   08/06/17 1206 08/06/17 1532  BP:  95/75  Pulse:  61  Resp:  18  Temp:  98.1 F (36.7 C)  SpO2: 96% 98%    Intake/Output Summary (Last 24 hours) at 08/06/2017 1813 Last data filed at 08/06/2017 1600 Gross per 24 hour  Intake 1636.25 ml  Output -  Net 1636.25 ml   Filed Weights   08/05/17 1044 08/05/17 1334 08/06/17 0600  Weight: 102.1 kg (225 lb) 102.1 kg (225 lb) 100.4 kg (221 lb 5.5 oz)    Exam:   General: Patient reports feeling better, even is still short of breath.  Is  having intermittent coughing and denies nausea or vomiting.  Low-grade temperature appreciated overnight.  Still requiring oxygen  supplementation (2 L).  Cardiovascular: S1 and S2, no rubs, no gallops, no JVD on exam.  Respiratory: Scattered rhonchi, no frank crackles, no wheezing.  Patient is no using accessory muscles.  Abdomen: Soft, nontender, nondistended, positive bowel sounds.  Musculoskeletal: Trace to 1+ edema bilaterally, TED hoses in place, no cyanosis, no clubbing.  Data Reviewed: Basic Metabolic Panel: Recent Labs  Lab 08/05/17 1053 08/05/17 1712 08/06/17 0701  NA 141  --  140  K 4.4  --  5.0  CL 105  --  103  CO2 24  --  26  GLUCOSE 114*  --  148*  BUN 30*  --  31*  CREATININE 1.98*  --  2.11*  CALCIUM 9.0  --  8.4*  MG  --  1.8  --   PHOS  --  3.9  --    Liver Function Tests: Recent Labs  Lab 08/05/17 1053  AST 19  ALT 12*  ALKPHOS 38  BILITOT 0.8  PROT 7.8  ALBUMIN 3.4*   Recent Labs  Lab 08/05/17 1053  LIPASE 25   CBC: Recent Labs  Lab 08/05/17 1053 08/06/17 0701  WBC 17.4* 13.6*  HGB 9.5* 8.5*  HCT 32.8* 29.0*  MCV 91.6 92.1  PLT 599* 498*   Cardiac Enzymes: Recent Labs  Lab 08/05/17 1053  TROPONINI <0.03   BNP (last 3 results) Recent Labs    08/05/17 1053  BNP 255.0*    CBG: Recent Labs  Lab 08/05/17 2303 08/06/17 0717 08/06/17 1117 08/06/17 1605 08/06/17 1654  GLUCAP 118* 152* 121* 67 127*    Studies: Dg Chest 2 View  Result Date: 08/05/2017 CLINICAL DATA:  Shortness of breath and right chest pain for 2 weeks. EXAM: CHEST - 2 VIEW COMPARISON:  CT chest and PA and lateral chest 07/21/2016. FINDINGS: The patient has a new moderate left pleural effusion with basilar airspace disease. Right lung is clear. No pneumothorax. There is cardiomegaly. Atherosclerosis noted. No acute bony abnormality. IMPRESSION: Moderate left pleural effusion and basilar airspace disease are new since the most recent examination. Airspace disease could be due to atelectasis or infection. Cardiomegaly without edema. Electronically Signed   By: Inge Rise M.D.   On:  08/05/2017 11:49    Scheduled Meds: . aspirin  81 mg Oral Daily  . fenofibrate  160 mg Oral Daily  . gabapentin  300 mg Oral BID  . heparin  5,000 Units Subcutaneous Q8H  . hydrALAZINE  25 mg Oral Q8H  . insulin aspart  0-5 Units Subcutaneous QHS  . insulin aspart  0-9 Units Subcutaneous TID WC  . labetalol  400 mg Oral BID  . levothyroxine  100 mcg Oral QAC breakfast  . pantoprazole  40 mg Oral Daily  . pravastatin  20 mg Oral Daily   Continuous Infusions: . aztreonam Stopped (08/06/17 1403)  . vancomycin Stopped (08/06/17 1730)    Time spent: 35 minutes   [place] [facility] Pager [phone]. If 7PM-7AM, please contact night-coverage at [email or web address], password [facility] 08/06/2017, 6:13 PM  LOS: 1 day

## 2017-08-07 ENCOUNTER — Inpatient Hospital Stay (HOSPITAL_COMMUNITY): Payer: Medicare Other

## 2017-08-07 LAB — BASIC METABOLIC PANEL
Anion gap: 10 (ref 5–15)
BUN: 38 mg/dL — ABNORMAL HIGH (ref 6–20)
CHLORIDE: 101 mmol/L (ref 101–111)
CO2: 25 mmol/L (ref 22–32)
Calcium: 8.1 mg/dL — ABNORMAL LOW (ref 8.9–10.3)
Creatinine, Ser: 2.97 mg/dL — ABNORMAL HIGH (ref 0.44–1.00)
GFR calc non Af Amer: 15 mL/min — ABNORMAL LOW (ref >60)
GFR, EST AFRICAN AMERICAN: 18 mL/min — AB (ref >60)
Glucose, Bld: 166 mg/dL — ABNORMAL HIGH (ref 65–99)
POTASSIUM: 5.9 mmol/L — AB (ref 3.5–5.1)
SODIUM: 136 mmol/L (ref 135–145)

## 2017-08-07 LAB — CREATININE, URINE, RANDOM: Creatinine, Urine: 155.18 mg/dL

## 2017-08-07 LAB — GLUCOSE, CAPILLARY
GLUCOSE-CAPILLARY: 136 mg/dL — AB (ref 65–99)
GLUCOSE-CAPILLARY: 134 mg/dL — AB (ref 65–99)
GLUCOSE-CAPILLARY: 113 mg/dL — AB (ref 65–99)
Glucose-Capillary: 120 mg/dL — ABNORMAL HIGH (ref 65–99)

## 2017-08-07 LAB — SODIUM, URINE, RANDOM: SODIUM UR: 37 mmol/L

## 2017-08-07 MED ORDER — SODIUM CHLORIDE 0.9 % IV SOLN
INTRAVENOUS | Status: DC
Start: 2017-08-07 — End: 2017-08-10
  Administered 2017-08-07 – 2017-08-09 (×5): via INTRAVENOUS

## 2017-08-07 MED ORDER — VANCOMYCIN HCL 10 G IV SOLR
1500.0000 mg | INTRAVENOUS | Status: DC
  Filled 2017-08-07: qty 1500

## 2017-08-07 MED ORDER — AZTREONAM 1 G IJ SOLR
0.5000 g | Freq: Three times a day (TID) | INTRAMUSCULAR | Status: DC
  Administered 2017-08-07 – 2017-08-08 (×3): 0.5 g via INTRAVENOUS
  Filled 2017-08-07 (×10): qty 0.5

## 2017-08-07 MED ORDER — LABETALOL HCL 200 MG PO TABS
200.0000 mg | ORAL_TABLET | Freq: Two times a day (BID) | ORAL | Status: DC
  Administered 2017-08-07 – 2017-08-14 (×14): 200 mg via ORAL
  Filled 2017-08-07 (×14): qty 1

## 2017-08-07 MED ORDER — SODIUM POLYSTYRENE SULFONATE 15 GM/60ML PO SUSP
15.0000 g | Freq: Once | ORAL | Status: AC
  Administered 2017-08-07: 15 g via ORAL
  Filled 2017-08-07 (×2): qty 60

## 2017-08-07 MED ORDER — HYDRALAZINE HCL 10 MG PO TABS
10.0000 mg | ORAL_TABLET | Freq: Three times a day (TID) | ORAL | Status: DC
  Administered 2017-08-10 – 2017-08-14 (×10): 10 mg via ORAL
  Filled 2017-08-07 (×14): qty 1

## 2017-08-07 MED ORDER — FUROSEMIDE 10 MG/ML IJ SOLN
60.0000 mg | Freq: Two times a day (BID) | INTRAMUSCULAR | Status: DC
  Administered 2017-08-07 – 2017-08-08 (×3): 60 mg via INTRAVENOUS
  Filled 2017-08-07 (×3): qty 6

## 2017-08-07 NOTE — Care Management Important Message (Signed)
Important Message  Patient Details  Name: Kathleen Vance MRN: [id number] Date of Birth: February 25, 1951   Medicare Important Message Given:  Yes    Shelda Altes 08/07/2017, 10:11 AM

## 2017-08-07 NOTE — Progress Notes (Signed)
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  Patient reports that for the last 2-3 days she has been experiencing shortness of breath on exertion and also at rest, express having chills and measured temperature up to 102 the night prior to admission.  She denies any chest pain, reports some nausea and inability to properly eat and drink; she has also noticed an increased swelling in her lower extremities. No hematemesis, no hematochezia, no melena, no dysuria, no hematuria, no headaches, no hemoptysis, no focal weakness or neurologic deficits  Assessment/Plan: 1-HCAP (healthcare-associated pneumonia) -follow blood cultures results, urine culture, strep pneumo and Legionella antigen in urine, sputum culture.   -continue current IV antibiotics (vancomycin and aztreonam) -continue oxygen supplementation and weaned as tolerated  -influenza panel neg -Follow clinical response.  2-Hypertension -Blood pressure improved with the use of antihypertensive agents  -heart healthy diet ordered  -follow VS and further adjust regimen as needed   3-Hypothyroidism -Continue Synthroid.   4-Hyperlipidemia -Continue statins.    5-Gout -No acute flare -Continue as needed allopurinol as previously taken.    6-Gastroesophageal reflux disease -no nausea and no heartburn reported -will continue PPI  7-Class 2 obesity due to excess calories with body mass index (BMI) of 38.0 to 38.9 in adult -Low calorie diet has  been discussed with the patient along with increase physical activity. -Body mass index is 38.9 kg/m.   8-Liver lesion -continue outpatient workup by PCP and follow-up with MRI   9-Acute renal failure superimposed on stage 2 chronic kidney disease ([place]) -Continue IV fluids -Minimize nephrotoxic agents and adjust medications dosage based on her renal clearance -Nephrology has been consulted due to further decline in her kidney function.  We will follow the recommendations.    10-type 2 DM: -A1c 6.8 -While inpatient will continue using sliding scale insulin -Patient need to have initiation of hypoglycemic regimen as an outpatient.   Code Status: full code  Family Communication: husband at bedside  Disposition Plan: Remains inpatient, will adjust antibiotic dosage to her renal function, anticipate transition of IV antibiotics to oral regimen on 08/08/17 if she has remained afebrile over 24 hours.  Continue weaning oxygen supplementation as tolerated, follow nephrology service recommendations as part of further workup/treatment for her acute on chronic renal failure.    Consultants:  Nephrology  Procedures:  See below for x-ray reports.  Antibiotics:  Vancomycin and aztreonam  08/05/17  HPI/Subjective: No fever, denies chest pain or palpitations.  Patient was right upper quadrant and left upper quadrant discomfort, no nausea or vomiting.  Breathing slightly improved, reports a decrease in her urine output.  Objective: Vitals:   08/07/17 1022 08/07/17 1401  BP: 121/74 110/82  Pulse: 63 60  Resp: 18 16  Temp: 97.8 F (36.6 C) 97.7 F (36.5 C)  SpO2: 100% 100%    Intake/Output Summary (Last 24 hours) at 08/07/2017 1837 Last data filed at 08/07/2017 1300 Gross per 24 hour  Intake 510 ml  Output 225 ml  Net 285 ml   Filed Weights   08/05/17 1334 08/06/17 0600 08/07/17 0600  Weight: 102.1 kg (225  lb) 100.4 kg (221 lb 5.5 oz) 102.8 kg (226 lb 10.1 oz)     Exam:   General: Patient reports feeling better overall, still having some shortness of breath, also reporting some upper right and left upper quadrant discomfort in her abdomen.  Denies chest pain, nausea, vomiting, or fever.  Oxygen saturation able to be weaned to just 1 L.  Patient reports in decreasing her urine output.  Cardiovascular: S1 and S2, no rubs, no gallops, no JVD on exam.    Respiratory: Positive for scattered rhonchi, no frank crackles, no wheezing.  No using accessory muscles and overall reports an improvement in her breathing.    Abdomen: Soft, nontender, nondistended, positive bowel sounds.    Musculoskeletal: Trace edema bilaterally, TED hoses in place, no cyanosis, no clubbing.    Data Reviewed: Basic Metabolic Panel: Recent Labs  Lab 08/05/17 1053 08/05/17 1712 08/06/17 0701 08/07/17 0440  NA 141  --  140 136  K 4.4  --  5.0 5.9*  CL 105  --  103 101  CO2 24  --  26 25  GLUCOSE 114*  --  148* 166*  BUN 30*  --  31* 38*  CREATININE 1.98*  --  2.11* 2.97*  CALCIUM 9.0  --  8.4* 8.1*  MG  --  1.8  --   --   PHOS  --  3.9  --   --    Liver Function Tests: Recent Labs  Lab 08/05/17 1053  AST 19  ALT 12*  ALKPHOS 38  BILITOT 0.8  PROT 7.8  ALBUMIN 3.4*   Recent Labs  Lab 08/05/17 1053  LIPASE 25   CBC: Recent Labs  Lab 08/05/17 1053 08/06/17 0701  WBC 17.4* 13.6*  HGB 9.5* 8.5*  HCT 32.8* 29.0*  MCV 91.6 92.1  PLT 599* 498*   Cardiac Enzymes: Recent Labs  Lab 08/05/17 1053  TROPONINI <0.03   BNP (last 3 results) Recent Labs    08/05/17 1053  BNP 255.0*    CBG: Recent Labs  Lab 08/06/17 1654 08/06/17 2110 08/07/17 0747 08/07/17 1131 08/07/17 1640  GLUCAP 127* 164* 136* 120* 134*    Studies: US Renal  Result Date: 08/07/2017 CLINICAL DATA:  Acute on chronic renal failure. EXAM: RENAL / URINARY TRACT ULTRASOUND COMPLETE COMPARISON:  None. FINDINGS: Right Kidney: Length: 9.9 cm. 1.3 cm simple cyst is seen in lower  pole. Increased echogenicity of renal parenchyma is noted. No mass or hydronephrosis visualized. Left Kidney: Length: 9.9 cm. 6 mm nonobstructive calculus is noted in midpole. Increased echogenicity of renal parenchyma is noted. No mass or hydronephrosis visualized. Bladder: Not visualized due to body habitus. IMPRESSION: Increased echogenicity of renal parenchyma is noted bilaterally consistent with medical renal disease. Nonobstructive left renal calculus. No hydronephrosis or renal obstruction is noted. Electronically Signed   By: Marijo Conception, M.D.   On: 08/07/2017 11:01    Scheduled Meds: . aspirin  81 mg Oral Daily  . fenofibrate  160 mg Oral Daily  . furosemide  60 mg Intravenous BID  . gabapentin  300 mg Oral BID  . heparin  5,000 Units Subcutaneous Q8H  . hydrALAZINE  10 mg Oral Q8H  . insulin aspart  0-5 Units Subcutaneous QHS  . insulin aspart  0-9 Units Subcutaneous TID WC  . labetalol  200 mg Oral BID  . levothyroxine  100 mcg Oral QAC breakfast  . pantoprazole  40 mg Oral Daily  . pravastatin  20 mg Oral  Daily   Continuous Infusions: . sodium chloride 100 mL/hr at 08/07/17 1447  . aztreonam Stopped (08/07/17 1632)  . [START ON 08/08/2017] vancomycin      Time spent: 35 minutes   [place] [facility] Pager [phone]. If 7PM-7AM, please contact night-coverage at [email or web address], password [facility] 08/07/2017, 6:37 PM  LOS: 2 days

## 2017-08-07 NOTE — Consult Note (Signed)
Reason for Consult: Acute kidney injury superimposed on chronic Referring Physician: Dr. Alonza Bogus is an 67 y.o. female.  HPI: She is a patient who has history of renal artery stenosis, hypertension, chronic renal failure stage III presently came with complaints of exertional dyspnea, orthopnea, fever and chills for the last couple of days.  Patient also has some nausea and abdominal pain.  She denies any vomiting.  When she was evaluated she was found to have pneumonia hence admitted to the hospital.  Presently consult is called because of worsening of her renal failure.  Patient states that she has been taking diuretics for leg swelling but discontinued about a week ago because of nausea and unable to drink any liquid.  Presently she denies any leg swelling.  Presently she is complaining of bilateral abdominal pain.  She still has some nausea but no vomiting.  Past Medical History:  Diagnosis Date  . Aortic stenosis   . Arthritis   . Celiac artery stenosis ([place])   . Gout   . Hyperlipidemia   . Hypertension   . Hypothyroidism   . Prediabetes   . Renal artery stenosis [facility])     Past Surgical History:  Procedure Laterality Date  . ANKLE SURGERY Bilateral   . CATARACT EXTRACTION    . CESAREAN SECTION    . CHOLECYSTECTOMY    . PARTIAL HYSTERECTOMY    . REPAIR KNEE LIGAMENT      Family History  Problem Relation Age of Onset  . CAD Mother   . Heart attack Mother   . CAD Father   . Pneumonia Sister   . Other Brother        killed at age 62  . CAD Maternal Grandmother   . Heart attack Maternal Grandmother   . Diabetes Maternal Grandfather   . Other Paternal Grandmother        died after child birth  . Heart attack Paternal Grandfather     Social History:  reports that she has never smoked. She has never used smokeless tobacco. She reports that she drinks alcohol. She reports that she does not use drugs.  Allergies:  Allergies  Allergen Reactions  . Bactrim  [Sulfamethoxazole-Trimethoprim]     Nausea, diarrhea  . Ciprofloxacin Anaphylaxis  . Nitrofurantoin Anaphylaxis  . Metronidazole     "sick"  . Amoxicillin Rash  . Erythromycin Rash    Medications: I have reviewed the patient's current medications.  Results for orders placed or performed during the hospital encounter of 08/05/17 (from the past 48 hour(s))  Troponin I     Status: None   Collection Time: 08/05/17 10:53 AM  Result Value Ref Range   Troponin I <0.03 <0.03 ng/mL    Comment: Performed at [facility], [address], [place], [place] [address]  CBC     Status: Abnormal   Collection Time: 08/05/17 10:53 AM  Result Value Ref Range   WBC 17.4 (H) 4.0 - 10.5 K/uL   RBC 3.58 (L) 3.87 - 5.11 MIL/uL   Hemoglobin 9.5 (L) 12.0 - 15.0 g/dL   HCT 32.8 (L) 36.0 - 46.0 %   MCV 91.6 78.0 - 100.0 fL   MCH 26.5 26.0 - 34.0 pg   MCHC 29.0 (L) 30.0 - 36.0 g/dL   RDW 15.5 11.5 - 15.5 %   Platelets 599 (H) 150 - 400 K/uL    Comment: Performed at [facility], [address] [place],  [address]  Comprehensive metabolic panel  Status: Abnormal   Collection Time: 08/05/17 10:53 AM  Result Value Ref Range   Sodium 141 135 - 145 mmol/L   Potassium 4.4 3.5 - 5.1 mmol/L   Chloride 105 101 - 111 mmol/L   CO2 24 22 - 32 mmol/L   Glucose, Bld 114 (H) 65 - 99 mg/dL   BUN 30 (H) 6 - 20 mg/dL   Creatinine, Ser 1.98 (H) 0.44 - 1.00 mg/dL   Calcium 9.0 8.9 - 10.3 mg/dL   Total Protein 7.8 6.5 - 8.1 g/dL   Albumin 3.4 (L) 3.5 - 5.0 g/dL   AST 19 15 - 41 U/L   ALT 12 (L) 14 - 54 U/L   Alkaline Phosphatase 38 38 - 126 U/L   Total Bilirubin 0.8 0.3 - 1.2 mg/dL   GFR calc non Af Amer 25 (L) >60 mL/min   GFR calc Af Amer 29 (L) >60 mL/min    Comment: (NOTE) The eGFR has been calculated using the CKD EPI equation. This calculation has not been validated in all clinical situations. eGFR's persistently <60 mL/min signify possible Chronic Kidney Disease.    Anion gap 12 5 - 15     Comment: Performed at [facility], [address], [place], [place] [address]  Brain natriuretic peptide     Status: Abnormal   Collection Time: 08/05/17 10:53 AM  Result Value Ref Range   B Natriuretic Peptide 255.0 (H) 0.0 - 100.0 pg/mL    Comment: Performed at [facility], [address] [place], [place] [address]  Lipase, blood     Status: None   Collection Time: 08/05/17 10:53 AM  Result Value Ref Range   Lipase 25 11 - 51 U/L    Comment: Performed at [facility], [address], [place], [place] [address]  Urinalysis, Routine w reflex microscopic     Status: Abnormal   Collection Time: 08/05/17 11:04 AM  Result Value Ref Range   Color, Urine YELLOW YELLOW   APPearance CLEAR CLEAR   Specific Gravity, Urine 1.017 1.005 - 1.030   pH 5.0 5.0 - 8.0   Glucose, UA NEGATIVE NEGATIVE mg/dL   Hgb urine dipstick SMALL (A) NEGATIVE   Bilirubin Urine NEGATIVE NEGATIVE   Ketones, ur NEGATIVE NEGATIVE mg/dL   Protein, ur 100 (A) NEGATIVE mg/dL   Nitrite NEGATIVE NEGATIVE   Leukocytes, UA TRACE (A) NEGATIVE   RBC / HPF 0-5 0 - 5 RBC/hpf   WBC, UA 6-30 0 - 5 WBC/hpf   Bacteria, UA RARE (A) NONE SEEN   Squamous Epithelial / LPF 0-5 (A) NONE SEEN   Mucus PRESENT    Hyaline Casts, UA PRESENT    Non Squamous Epithelial 0-5 (A) NONE SEEN    Comment: Performed at [facility], [address] [place], [place] [address]  Strep pneumoniae urinary antigen     Status: None   Collection Time: 08/05/17  4:08 PM  Result Value Ref Range   Strep Pneumo Urinary Antigen NEGATIVE NEGATIVE    Comment:        Infection due to S. pneumoniae cannot be absolutely ruled out since the antigen present may be below the detection limit of the test.   Influenza panel by PCR (type A & B)     Status: None   Collection Time: 08/05/17  4:57 PM  Result Value Ref Range   Influenza A By PCR NEGATIVE NEGATIVE   Influenza B By PCR NEGATIVE NEGATIVE    Comment: (NOTE) The Xpert Xpress Flu  assay is intended as an aid  in the diagnosis of  influenza and should not be used as a sole basis for treatment.  This  assay is FDA approved for nasopharyngeal swab specimens only. Nasal  washings and aspirates are unacceptable for Xpert Xpress Flu testing. Performed at [facility], [address], [place], [place] [address]   Phosphorus     Status: None   Collection Time: 08/05/17  5:12 PM  Result Value Ref Range   Phosphorus 3.9 2.5 - 4.6 mg/dL    Comment: Performed at [facility], [address], [place], [place] [address]  Magnesium     Status: None   Collection Time: 08/05/17  5:12 PM  Result Value Ref Range   Magnesium 1.8 1.7 - 2.4 mg/dL    Comment: Performed at [facility], [address], [place], [place] [address]  HIV antibody (Routine Screening)     Status: None   Collection Time: 08/05/17  5:12 PM  Result Value Ref Range   HIV Screen 4th Generation wRfx Non Reactive Non Reactive    Comment: (NOTE) Performed At: [facility] [address] [place], [place] [id number] Rush Farmer MD [id number] Performed at [facility], [address] [place], [place] [address]   Glucose, capillary     Status: Abnormal   Collection Time: 08/05/17  5:16 PM  Result Value Ref Range   Glucose-Capillary 101 (H) 65 - 99 mg/dL  Glucose, capillary     Status: Abnormal   Collection Time: 08/05/17 11:03 PM  Result Value Ref Range   Glucose-Capillary 118 (H) 65 - 99 mg/dL  CBC     Status: Abnormal   Collection Time: 08/06/17  7:01 AM  Result Value Ref Range   WBC 13.6 (H) 4.0 - 10.5 K/uL   RBC 3.15 (L) 3.87 - 5.11 MIL/uL   Hemoglobin 8.5 (L) 12.0 - 15.0 g/dL   HCT 29.0 (L) 36.0 - 46.0 %   MCV 92.1 78.0 - 100.0 fL   MCH 27.0 26.0 - 34.0 pg   MCHC 29.3 (L) 30.0 - 36.0 g/dL   RDW 15.6 (H) 11.5 - 15.5 %   Platelets 498 (H) 150 - 400 K/uL    Comment: Performed at [facility], [address], [place], [place] [address]  Basic metabolic panel     Status: Abnormal   Collection Time: 08/06/17  7:01 AM   Result Value Ref Range   Sodium 140 135 - 145 mmol/L   Potassium 5.0 3.5 - 5.1 mmol/L   Chloride 103 101 - 111 mmol/L   CO2 26 22 - 32 mmol/L   Glucose, Bld 148 (H) 65 - 99 mg/dL   BUN 31 (H) 6 - 20 mg/dL   Creatinine, Ser 2.11 (H) 0.44 - 1.00 mg/dL   Calcium 8.4 (L) 8.9 - 10.3 mg/dL   GFR calc non Af Amer 23 (L) >60 mL/min   GFR calc Af Amer 27 (L) >60 mL/min    Comment: (NOTE) The eGFR has been calculated using the CKD EPI equation. This calculation has not been validated in all clinical situations. eGFR's persistently <60 mL/min signify possible Chronic Kidney Disease.    Anion gap 11 5 - 15    Comment: Performed at [facility], [address] [place],  [address]  Glucose, capillary     Status: Abnormal   Collection Time: 08/06/17  7:17 AM  Result Value Ref Range   Glucose-Capillary 152 (H) 65 - 99 mg/dL   Comment 1 Notify  RN    Comment 2 Document in Chart   Glucose, capillary     Status: Abnormal   Collection Time: 08/06/17 11:17 AM  Result Value Ref Range   Glucose-Capillary 121 (H) 65 - 99 mg/dL   Comment 1 Notify RN    Comment 2 Document in Chart   Glucose, capillary     Status: None   Collection Time: 08/06/17  4:05 PM  Result Value Ref Range   Glucose-Capillary 67 65 - 99 mg/dL   Comment 1 Notify RN    Comment 2 Document in Chart   Glucose, capillary     Status: Abnormal   Collection Time: 08/06/17  4:54 PM  Result Value Ref Range   Glucose-Capillary 127 (H) 65 - 99 mg/dL   Comment 1 Notify RN    Comment 2 Document in Chart   Glucose, capillary     Status: Abnormal   Collection Time: 08/06/17  9:10 PM  Result Value Ref Range   Glucose-Capillary 164 (H) 65 - 99 mg/dL   Comment 1 Notify RN    Comment 2 Document in Chart   Basic metabolic panel     Status: Abnormal   Collection Time: 08/07/17  4:40 AM  Result Value Ref Range   Sodium 136 135 - 145 mmol/L   Potassium 5.9 (H) 3.5 - 5.1 mmol/L   Chloride 101 101 - 111 mmol/L   CO2 25 22 - 32 mmol/L    Glucose, Bld 166 (H) 65 - 99 mg/dL   BUN 38 (H) 6 - 20 mg/dL   Creatinine, Ser 2.97 (H) 0.44 - 1.00 mg/dL   Calcium 8.1 (L) 8.9 - 10.3 mg/dL   GFR calc non Af Amer 15 (L) >60 mL/min   GFR calc Af Amer 18 (L) >60 mL/min    Comment: (NOTE) The eGFR has been calculated using the CKD EPI equation. This calculation has not been validated in all clinical situations. eGFR's persistently <60 mL/min signify possible Chronic Kidney Disease.    Anion gap 10 5 - 15    Comment: Performed at [facility], [address] [place], [place] [address]  Glucose, capillary     Status: Abnormal   Collection Time: 08/07/17  7:47 AM  Result Value Ref Range   Glucose-Capillary 136 (H) 65 - 99 mg/dL   Comment 1 Notify RN    Comment 2 Document in Chart     Dg Chest 2 View  Result Date: 08/05/2017 CLINICAL DATA:  Shortness of breath and right chest pain for 2 weeks. EXAM: CHEST - 2 VIEW COMPARISON:  CT chest and PA and lateral chest 07/21/2016. FINDINGS: The patient has a new moderate left pleural effusion with basilar airspace disease. Right lung is clear. No pneumothorax. There is cardiomegaly. Atherosclerosis noted. No acute bony abnormality. IMPRESSION: Moderate left pleural effusion and basilar airspace disease are new since the most recent examination. Airspace disease could be due to atelectasis or infection. Cardiomegaly without edema. Electronically Signed   By: Inge Rise M.D.   On: 08/05/2017 11:49    Review of Systems  Constitutional: Positive for chills, fever and malaise/fatigue.  Respiratory: Positive for shortness of breath. Negative for cough.   Cardiovascular: Positive for orthopnea. Negative for chest pain.  Gastrointestinal: Positive for abdominal pain and nausea. Negative for vomiting.   Blood pressure 95/60, pulse (!) 58, temperature (!) 97.4 F (36.3 C), temperature source Oral, resp. rate 14, height 5' 4"  (1.626 m), weight 102.8 kg (226 lb 10.1 oz), SpO2  93 %. Physical  Exam  Assessment/Plan: 1] acute kidney injury superimposed on chronic: Possibly secondary to prerenal syndrome/ATN/vancomycin induced acute kidney injury.  Her creatinine has been increasing since her admission. 2] chronic renal failure: The last creatinine was from 3/19 2019 when she was admitted to the hospital because of chest pain.  Her EGFR is 30 cc/min stage III.  According to the patient she was told she has renal artery stenosis since that she was teenager.  Initially she was evaluated to have stent placement but for different reasons it has never been done.  Patient has CT angiogram done on 05/27/2004 which showed minimal left artery stenosis at its origin.  Left kidney seems to be smaller than the right kidney.  Presently patient is being followed by Dr. Posey Pronto. 3] hypertension: Patient is states that her blood pressure is difficult to control.  She has been on multiple medications. 4] hyperkalemia 5] history of celiac artery stenosis. 6] patient with history of liver lesion.  She was supposed to have MRI but unable to lie down because of orthopnea. 7] history of hypothyroidism 8] pneumonia Plan: 1]we suggest possibly changing her vancomycin 2] low potassium diet 3] agree with hydration 4] we will start Lasix 60 mg IV to improve her urine output and better control her potassium. 5] will do ultrasound of her kidneys. 6] we will check urine sodium and creatinine.  Ardella Chhim S 08/07/2017, 9:24 AM

## 2017-08-07 NOTE — Progress Notes (Signed)
Pharmacy Antibiotic Note  Kathleen Vance is a 67 y.o. female admitted on 08/05/2017 with pneumonia.  Pharmacy has been consulted for Vancomycin dosing. Patient is also on aztreonam. CrCl has decreased, normalized CrCl 63mls/min  Plan: Decrease Vancomycin 1500 mg IV every 48 hours. Goal trough 15-20 mcg/mL. Decrease Aztreonam 500 mg IV q8h hours Monitor labs, c/s, and vanco trough as indicated.   Height: 5\' 4"  (162.6 cm) Weight: 226 lb 10.1 oz (102.8 kg) IBW/kg (Calculated) : 54.7  Temp (24hrs), Avg:97.8 F (36.6 C), Min:97.4 F (36.3 C), Max:98.1 F (36.7 C)  Recent Labs  Lab 08/05/17 1053 08/06/17 0701 08/07/17 0440  WBC 17.4* 13.6*  --   CREATININE 1.98* 2.11* 2.97*    Estimated Creatinine Clearance: 21.4 mL/min (A) (by C-G formula based on SCr of 2.97 mg/dL (H)).    Allergies  Allergen Reactions  . Bactrim [Sulfamethoxazole-Trimethoprim]     Nausea, diarrhea  . Ciprofloxacin Anaphylaxis  . Nitrofurantoin Anaphylaxis  . Metronidazole     "sick"  . Amoxicillin Rash  . Erythromycin Rash    Antimicrobials this admission: Aztreonam 4/3 >>  Vancomycin 4/3>>  Dose adjustments this admission: 4/5 Vancomycin 1500mg  IV q48h and Aztreonam 500mg  IV q8h  Microbiology results: 4/3 Sputum: pending    Thank you for allowing pharmacy to be a part of this patient's care.  Isac Sarna, BS Pharm D, [place] Clinical Pharmacist Pager [phone] 08/07/2017 9:48 AM

## 2017-08-08 DIAGNOSIS — J9622 Acute and chronic respiratory failure with hypercapnia: Secondary | ICD-10-CM

## 2017-08-08 DIAGNOSIS — E872 Acidosis, unspecified: Secondary | ICD-10-CM

## 2017-08-08 LAB — BLOOD GAS, ARTERIAL
ACID-BASE DEFICIT: 4.8 mmol/L — AB (ref 0.0–2.0)
Acid-base deficit: 6.7 mmol/L — ABNORMAL HIGH (ref 0.0–2.0)
BICARBONATE: 19.7 mmol/L — AB (ref 20.0–28.0)
Bicarbonate: 18 mmol/L — ABNORMAL LOW (ref 20.0–28.0)
DRAWN BY: 274071
Delivery systems: POSITIVE
Drawn by: 382351
EXPIRATORY PAP: 6
FIO2: 40
Inspiratory PAP: 12
O2 Content: 2.5 L/min
O2 Saturation: 95.7 %
O2 Saturation: 97.9 %
PATIENT TEMPERATURE: 37
PATIENT TEMPERATURE: 37
PCO2 ART: 61.9 mmHg — AB (ref 32.0–48.0)
PH ART: 7.132 — AB (ref 7.350–7.450)
PH ART: 7.183 — AB (ref 7.350–7.450)
pCO2 arterial: 66 mmHg (ref 32.0–48.0)
pO2, Arterial: 92.1 mmHg (ref 83.0–108.0)
pO2, Arterial: 127 mmHg — ABNORMAL HIGH (ref 83.0–108.0)

## 2017-08-08 LAB — RENAL FUNCTION PANEL
Albumin: 2.7 g/dL — ABNORMAL LOW (ref 3.5–5.0)
Anion gap: 13 (ref 5–15)
BUN: 44 mg/dL — ABNORMAL HIGH (ref 6–20)
CHLORIDE: 102 mmol/L (ref 101–111)
CO2: 22 mmol/L (ref 22–32)
Calcium: 7.8 mg/dL — ABNORMAL LOW (ref 8.9–10.3)
Creatinine, Ser: 3.95 mg/dL — ABNORMAL HIGH (ref 0.44–1.00)
GFR calc non Af Amer: 11 mL/min — ABNORMAL LOW (ref >60)
GFR, EST AFRICAN AMERICAN: 13 mL/min — AB (ref >60)
Glucose, Bld: 138 mg/dL — ABNORMAL HIGH (ref 65–99)
POTASSIUM: 5.9 mmol/L — AB (ref 3.5–5.1)
Phosphorus: 8.5 mg/dL — ABNORMAL HIGH (ref 2.5–4.6)
Sodium: 137 mmol/L (ref 135–145)

## 2017-08-08 LAB — BASIC METABOLIC PANEL
Anion gap: 13 (ref 5–15)
BUN: 44 mg/dL — ABNORMAL HIGH (ref 6–20)
CO2: 22 mmol/L (ref 22–32)
Calcium: 7.9 mg/dL — ABNORMAL LOW (ref 8.9–10.3)
Chloride: 103 mmol/L (ref 101–111)
Creatinine, Ser: 3.97 mg/dL — ABNORMAL HIGH (ref 0.44–1.00)
GFR calc Af Amer: 12 mL/min — ABNORMAL LOW (ref >60)
GFR calc non Af Amer: 11 mL/min — ABNORMAL LOW (ref >60)
GLUCOSE: 138 mg/dL — AB (ref 65–99)
POTASSIUM: 5.9 mmol/L — AB (ref 3.5–5.1)
Sodium: 138 mmol/L (ref 135–145)

## 2017-08-08 LAB — GLUCOSE, CAPILLARY
GLUCOSE-CAPILLARY: 113 mg/dL — AB (ref 65–99)
Glucose-Capillary: 108 mg/dL — ABNORMAL HIGH (ref 65–99)
Glucose-Capillary: 72 mg/dL (ref 65–99)
Glucose-Capillary: 115 mg/dL — ABNORMAL HIGH (ref 65–99)

## 2017-08-08 LAB — MRSA PCR SCREENING: MRSA by PCR: NEGATIVE

## 2017-08-08 MED ORDER — OXYCODONE-ACETAMINOPHEN 5-325 MG PO TABS
1.0000 | ORAL_TABLET | Freq: Three times a day (TID) | ORAL | Status: DC | PRN
  Administered 2017-08-09 – 2017-08-13 (×7): 1 via ORAL
  Filled 2017-08-08 (×8): qty 1

## 2017-08-08 MED ORDER — SODIUM POLYSTYRENE SULFONATE 15 GM/60ML PO SUSP
30.0000 g | ORAL | Status: AC
  Administered 2017-08-08 (×2): 30 g via ORAL
  Filled 2017-08-08 (×2): qty 120

## 2017-08-08 MED ORDER — AMOXICILLIN-POT CLAVULANATE 500-125 MG PO TABS
1.0000 | ORAL_TABLET | Freq: Two times a day (BID) | ORAL | Status: DC
  Administered 2017-08-08 – 2017-08-13 (×11): 500 mg via ORAL
  Filled 2017-08-08 (×15): qty 1

## 2017-08-08 MED ORDER — DEXTROSE 5 % IV SOLN
120.0000 mg | Freq: Two times a day (BID) | INTRAVENOUS | Status: DC
  Administered 2017-08-08 – 2017-08-09 (×2): 120 mg via INTRAVENOUS
  Filled 2017-08-08 (×4): qty 12

## 2017-08-08 MED ORDER — DIPHENHYDRAMINE HCL 25 MG PO CAPS
25.0000 mg | ORAL_CAPSULE | Freq: Four times a day (QID) | ORAL | Status: DC | PRN
Start: 2017-08-08 — End: 2017-08-14
  Administered 2017-08-10 – 2017-08-12 (×2): 25 mg via ORAL
  Filled 2017-08-08 (×2): qty 1

## 2017-08-08 NOTE — Progress Notes (Signed)
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  Patient reports that for the last 2-3 days she has been experiencing shortness of breath on exertion and also at rest, express having chills and measured temperature up to 102 the night prior to admission.  She denies any chest pain, reports some nausea and inability to properly eat and drink; she has also noticed an increased swelling in her lower extremities. No hematemesis, no hematochezia, no melena, no dysuria, no hematuria, no headaches, no hemoptysis, no focal weakness or neurologic deficits  Assessment/Plan: 1-HCAP (healthcare-associated pneumonia)/with acute hypercapnic respiratory failure -Transfer to stepdown and initiate BiPAP -Repeat ABG in 3 hours after using noninvasive ventilatory support. -follow blood cultures results, urine culture, strep pneumo and Legionella antigen in urine/sputum culture.   -Patient no longer febrile and with normal WBCs; will discontinue IV broad-spectrum antibiotics and transition to Augmentin at this time.  (This will also benefit improvement in her renal function as vancomycin is discontinued). -continue oxygen supplementation and weaned as tolerated  -follow clinical response   2-Hypertension -Blood pressure reasonably controlled -Continue current antihypertensive regimen -Avoid hypotension.  3-Hypothyroidism -Continue Synthroid.     4-Hyperlipidemia -will continue statins.    5-Gout -No acute  flare -Continue as needed allopurinol as previously taken.    6-Gastroesophageal reflux disease -no nausea and no heartburn reported -will continue PPI  7-Class 2 obesity due to excess calories with body mass index (BMI) of 38.0 to 38.9 in adult -Low calorie diet has been discussed with the patient along with increase physical activity. -Body mass index is 40.15 kg/m.  -With most likely a component of obesity hypoventilation syndrome and possible obstructive sleep apnea. -Patient would benefit a sleep study as an outpatient.  8-Liver lesion -continue outpatient workup by PCP and follow-up with MRI   9-Acute renal failure superimposed on stage 2 chronic kidney disease ([place]) -renal function has further decompensated -Renal service on board, will follow recommendations -vancomycin discontinued on 4/6 -follow renal function trend  -continue minimizing/avoiding nephrotoxic agents.  10-type 2 DM: -A1c 6.8 -continue SSI while inpatient   Code Status: full code  Family Communication: husband at bedside  Disposition Plan: Remains inpatient, will transition to augmentin, d/c Vancomycin and Aztreonam. Transfer to stepdown bed and pursuit BIPAP use for acute hypercapnic resp failure. Follow nephrology recommendations for abnormal renal US.   Consultants:  Nephrology  Procedures:  See below for x-ray reports.  Antibiotics:  Vancomycin and aztreonam  08/05/17>>>08/08/17  augmentin 08/08/17  HPI/Subjective: No fever, denies chest pain or palpitations.  Patient is lethargic/somnolent and demonstrating hypercapnia.  Objective: Vitals:   08/08/17 1002 08/08/17 1500  BP: 101/68 106/68  Pulse: (!) 56 66  Resp: 18 16  Temp: (!) 97.5 F (36.4 C)   SpO2: 100% 90%    Intake/Output Summary (Last 24 hours) at 08/08/2017 1546 Last data filed at 08/08/2017 1500 Gross per 24 hour  Intake 3207.91 ml  Output 100 ml  Net 3107.91 ml   Filed Weights   08/06/17 0600 08/07/17 0600 08/08/17  0500  Weight: 100.4 kg (221 lb 5.5 oz) 102.8 kg (226 lb  10.1 oz) 106.1 kg (233 lb 14.5 oz)    Exam:   General: Afebrile, denies chest pain, no nausea vomiting.  Patient is very somnolent/lethargic and having difficulties keeping herself awake.  Abnormal ABG demonstrating acidosis and hypercapnia.   Cardiovascular: S1 and S2, no rubs, no gallops, no JVD.  Respiratory: Good air movement, no frank crackles, no wheezing, no using accessory muscles.     Abdomen: Soft, nontender, Mild distended, positive bowel sounds.    Musculoskeletal: Trace edema bilaterally, TED hoses in place, no cyanosis, no clubbing.  Data Reviewed: Basic Metabolic Panel: Recent Labs  Lab 08/05/17 1053 08/05/17 1712 08/06/17 0701 08/07/17 0440 08/08/17 0638  NA 141  --  140 136 137  138  K 4.4  --  5.0 5.9* 5.9*  5.9*  CL 105  --  103 101 102  103  CO2 24  --  26 25 22  22   GLUCOSE 114*  --  148* 166* 138*  138*  BUN 30*  --  31* 38* 44*  44*  CREATININE 1.98*  --  2.11* 2.97* 3.95*  3.97*  CALCIUM 9.0  --  8.4* 8.1* 7.8*  7.9*  MG  --  1.8  --   --   --   PHOS  --  3.9  --   --  8.5*   Liver Function Tests: Recent Labs  Lab 08/05/17 1053 08/08/17 0638  AST 19  --   ALT 12*  --   ALKPHOS 38  --   BILITOT 0.8  --   PROT 7.8  --   ALBUMIN 3.4* 2.7*   Recent Labs  Lab 08/05/17 1053  LIPASE 25   CBC: Recent Labs  Lab 08/05/17 1053 08/06/17 0701  WBC 17.4* 13.6*  HGB 9.5* 8.5*  HCT 32.8* 29.0*  MCV 91.6 92.1  PLT 599* 498*   Cardiac Enzymes: Recent Labs  Lab 08/05/17 1053  TROPONINI <0.03   BNP (last 3 results) Recent Labs    08/05/17 1053  BNP 255.0*    CBG: Recent Labs  Lab 08/07/17 1640 08/07/17 2058 08/08/17 0739 08/08/17 1141 08/08/17 1230  GLUCAP 134* 113* 108* 72 113*    Studies: US Renal  Result Date: 08/07/2017 CLINICAL DATA:  Acute on chronic renal failure. EXAM: RENAL / URINARY TRACT ULTRASOUND COMPLETE COMPARISON:  None. FINDINGS: Right Kidney:  Length: 9.9 cm. 1.3 cm simple cyst is seen in lower pole. Increased echogenicity of renal parenchyma is noted. No mass or hydronephrosis visualized. Left Kidney: Length: 9.9 cm. 6 mm nonobstructive calculus is noted in midpole. Increased echogenicity of renal parenchyma is noted. No mass or hydronephrosis visualized. Bladder: Not visualized due to body habitus. IMPRESSION: Increased echogenicity of renal parenchyma is noted bilaterally consistent with medical renal disease. Nonobstructive left renal calculus. No hydronephrosis or renal obstruction is noted. Electronically Signed   By: Marijo Conception, M.D.   On: 08/07/2017 11:01    Scheduled Meds: . amoxicillin-clavulanate  1 tablet Oral Q12H  . aspirin  81 mg Oral Daily  . fenofibrate  160 mg Oral Daily  . gabapentin  300 mg Oral BID  . heparin  5,000 Units Subcutaneous Q8H  . hydrALAZINE  10 mg Oral Q8H  . insulin aspart  0-5 Units Subcutaneous QHS  . insulin aspart  0-9 Units Subcutaneous TID WC  . labetalol  200 mg Oral BID  . levothyroxine  100 mcg Oral QAC breakfast  . pantoprazole  40 mg Oral Daily  . pravastatin  20 mg Oral Daily   Continuous Infusions: . sodium chloride 100 mL/hr at 08/08/17 1325  . furosemide      Time spent: 35 minutes   [facility] Pager [phone]. If 7PM-7AM, please contact night-coverage at [email or web address], password [facility] 08/08/2017, 3:46 PM  LOS: 3 days

## 2017-08-08 NOTE — Progress Notes (Signed)
Report called to ICU nurse, patient transferred via bed accompanied by staff.

## 2017-08-08 NOTE — Progress Notes (Signed)
CRITICAL VALUE ALERT  Critical Value:  ABG ph 7.18, PCO2 61.9  Date & Time Notied:  08/08/2017 2100  Provider Notified: X. Blount, MD  Orders Received/Actions taken: no new orders at this time

## 2017-08-08 NOTE — Progress Notes (Signed)
Increased patients BIPAP settings to 16/7, dropped FiO2 to 30%, and switched BIPAP machine from Vision to V60 following the ABG results. RN notified.

## 2017-08-08 NOTE — Progress Notes (Signed)
Pt transferred to ICU and placed on BIPAP due to ABG results per MD order.  Pt tolerating well at this time.  RT will continue to monitor.

## 2017-08-08 NOTE — Progress Notes (Signed)
Subjective: Interval History: has complaints of feeling weak and some exertional dyspnea especially when she is walking.  States she has some difficulty breathing but seems to be somewhat better.  Her appetite is okay.  Objective: Vital signs in last 24 hours: Temp:  [97.6 F (36.4 C)-98.5 F (36.9 C)] 97.6 F (36.4 C) (04/06 0500) Pulse Rate:  [60-71] 63 (04/06 0500) Resp:  [16-19] 19 (04/06 0500) BP: (105-121)/(67-82) 105/68 (04/06 0500) SpO2:  [100 %] 100 % (04/06 0500) Weight:  [106.1 kg (233 lb 14.5 oz)] 106.1 kg (233 lb 14.5 oz) (04/06 0500) Weight change: 3.3 kg (7 lb 4.4 oz)  Intake/Output from previous day: 04/05 0701 - 04/06 0700 In: 2159.6 [P.O.:360; I.V.:1699.6; IV Piggyback:100] Out: 225 [Urine:225] Intake/Output this shift: No intake/output data recorded.  General appearance: alert, cooperative and no distress Resp: rales bilaterally Cardio: regular rate and rhythm Extremities: No edema  Lab Results: Recent Labs    08/05/17 1053 08/06/17 0701  WBC 17.4* 13.6*  HGB 9.5* 8.5*  HCT 32.8* 29.0*  PLT 599* 498*   BMET:  Recent Labs    08/07/17 0440 08/08/17 0638  NA 136 137  138  K 5.9* 5.9*  5.9*  CL 101 102  103  CO2 25 22  22   GLUCOSE 166* 138*  138*  BUN 38* 44*  44*  CREATININE 2.97* 3.95*  3.97*  CALCIUM 8.1* 7.8*  7.9*   No results for input(s): PTH in the last 72 hours. Iron Studies: No results for input(s): IRON, TIBC, TRANSFERRIN, FERRITIN in the last 72 hours.  Studies/Results: US Renal  Result Date: 08/07/2017 CLINICAL DATA:  Acute on chronic renal failure. EXAM: RENAL / URINARY TRACT ULTRASOUND COMPLETE COMPARISON:  None. FINDINGS: Right Kidney: Length: 9.9 cm. 1.3 cm simple cyst is seen in lower pole. Increased echogenicity of renal parenchyma is noted. No mass or hydronephrosis visualized. Left Kidney: Length: 9.9 cm. 6 mm nonobstructive calculus is noted in midpole. Increased echogenicity of renal parenchyma is noted. No mass or  hydronephrosis visualized. Bladder: Not visualized due to body habitus. IMPRESSION: Increased echogenicity of renal parenchyma is noted bilaterally consistent with medical renal disease. Nonobstructive left renal calculus. No hydronephrosis or renal obstruction is noted. Electronically Signed   By: Marijo Conception, M.D.   On: 08/07/2017 11:01    I have reviewed the patient's current medications.  Assessment/Plan: 1] acute kidney injury superimposed on chronic: Possibly ATN/prerenal/vancomycin associated acute kidney injury.  Presently her renal function seems to be getting worse.  Patient presently denies any nausea or vomiting. 2] hyperkalemia: Her potassium is getting worse. 3] hypertension: Her blood pressure is reasonably controlled 4] chronic kidney disease: Patient with history of renal artery stenosis.  Ultrasound of the kidney showed increased bilateral echogenicity consistent with chronic kidney injury 5] bone and mineral disorder: Calcium is 0 range but phosphorus is high.  Patient presently is not in a binder. 6] anemia: Her hemoglobin below  our target goal 7] hypothyroidism Plan: We will give Kayexalate 30 g p.o. x2 doses 2] will increase Lasix to 120 mg IV twice daily 3] we will check a renal panel in the morning. 4] we will check vitamin D level and intact PTH 5] I have discussed with the patient if her renal function continued to decline we will consider dialysis. 5] we will check iron studies in the morning   LOS: 3 days   Emmerich Cryer S 08/08/2017,9:07 AM

## 2017-08-08 NOTE — Progress Notes (Signed)
Assisted patient up to bedside commode, states that she feels a little better being that she got up but feels worse than yesterday. More alert at this time, will continue to monitor.

## 2017-08-09 LAB — BLOOD GAS, ARTERIAL
ACID-BASE DEFICIT: 3.2 mmol/L — AB (ref 0.0–2.0)
Bicarbonate: 21.2 mmol/L (ref 20.0–28.0)
Delivery systems: POSITIVE
Drawn by: 221791
Expiratory PAP: 7
FIO2: 30
INSPIRATORY PAP: 16
LHR: 8 {breaths}/min
O2 Saturation: 96.3 %
PCO2 ART: 51.4 mmHg — AB (ref 32.0–48.0)
PH ART: 7.269 — AB (ref 7.350–7.450)
pO2, Arterial: 91.7 mmHg (ref 83.0–108.0)

## 2017-08-09 LAB — RENAL FUNCTION PANEL
Albumin: 2.5 g/dL — ABNORMAL LOW (ref 3.5–5.0)
Anion gap: 13 (ref 5–15)
BUN: 44 mg/dL — AB (ref 6–20)
CALCIUM: 7.2 mg/dL — AB (ref 8.9–10.3)
CO2: 22 mmol/L (ref 22–32)
CREATININE: 3.95 mg/dL — AB (ref 0.44–1.00)
Chloride: 106 mmol/L (ref 101–111)
GFR calc Af Amer: 13 mL/min — ABNORMAL LOW (ref >60)
GFR calc non Af Amer: 11 mL/min — ABNORMAL LOW (ref >60)
GLUCOSE: 101 mg/dL — AB (ref 65–99)
Phosphorus: 7.4 mg/dL — ABNORMAL HIGH (ref 2.5–4.6)
Potassium: 4.2 mmol/L (ref 3.5–5.1)
SODIUM: 141 mmol/L (ref 135–145)

## 2017-08-09 LAB — GLUCOSE, CAPILLARY
GLUCOSE-CAPILLARY: 77 mg/dL (ref 65–99)
Glucose-Capillary: 126 mg/dL — ABNORMAL HIGH (ref 65–99)
Glucose-Capillary: 126 mg/dL — ABNORMAL HIGH (ref 65–99)
Glucose-Capillary: 118 mg/dL — ABNORMAL HIGH (ref 65–99)

## 2017-08-09 LAB — CBC
HCT: 29.1 % — ABNORMAL LOW (ref 36.0–46.0)
Hemoglobin: 8.4 g/dL — ABNORMAL LOW (ref 12.0–15.0)
MCH: 26.8 pg (ref 26.0–34.0)
MCHC: 28.9 g/dL — AB (ref 30.0–36.0)
MCV: 93 fL (ref 78.0–100.0)
PLATELETS: 495 10*3/uL — AB (ref 150–400)
RBC: 3.13 MIL/uL — ABNORMAL LOW (ref 3.87–5.11)
RDW: 16.5 % — AB (ref 11.5–15.5)
WBC: 7.7 10*3/uL (ref 4.0–10.5)

## 2017-08-09 MED ORDER — ALPRAZOLAM 0.5 MG PO TABS
0.5000 mg | ORAL_TABLET | Freq: Once | ORAL | Status: AC
  Administered 2017-08-09: 0.5 mg via ORAL
  Filled 2017-08-09: qty 1

## 2017-08-09 MED ORDER — DEXTROSE 5 % IV SOLN
160.0000 mg | Freq: Two times a day (BID) | INTRAVENOUS | Status: DC
  Administered 2017-08-09 – 2017-08-10 (×2): 160 mg via INTRAVENOUS
  Filled 2017-08-09 (×4): qty 16

## 2017-08-09 MED ORDER — ORAL CARE MOUTH RINSE
15.0000 mL | Freq: Two times a day (BID) | OROMUCOSAL | Status: DC
  Administered 2017-08-09 – 2017-08-12 (×8): 15 mL via OROMUCOSAL

## 2017-08-09 MED ORDER — SODIUM BICARBONATE 650 MG PO TABS
650.0000 mg | ORAL_TABLET | Freq: Two times a day (BID) | ORAL | Status: DC
  Administered 2017-08-09 (×2): 650 mg via ORAL
  Filled 2017-08-09 (×2): qty 1

## 2017-08-09 MED ORDER — CHLORHEXIDINE GLUCONATE 0.12 % MT SOLN
15.0000 mL | Freq: Two times a day (BID) | OROMUCOSAL | Status: DC
  Administered 2017-08-09 – 2017-08-13 (×9): 15 mL via OROMUCOSAL
  Filled 2017-08-09 (×9): qty 15

## 2017-08-09 NOTE — Plan of Care (Signed)
Talked with son and DIL about dietary restrictions that may be necessary for renal function. Offered suggestions about spices and methods of cooking that may enhance food without using additional sodium.  Explained amounts of sodium found in common foods and strategies to avoid those.  Son and DIL will be assisting with/preparing meals and helping with grocery shopping.  Suggested websites that might be helpful in getting additional information when it isi needed.

## 2017-08-09 NOTE — Progress Notes (Signed)
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  Patient reports that for the last 2-3 days she has been experiencing shortness of breath on exertion and also at rest, express having chills and measured temperature up to 102 the night prior to admission.  She denies any chest pain, reports some nausea and inability to properly eat and drink; she has also noticed an increased swelling in her lower extremities. No hematemesis, no hematochezia, no melena, no dysuria, no hematuria, no headaches, no hemoptysis, no focal weakness or neurologic deficits  Assessment/Plan: 1-HCAP (healthcare-associated pneumonia)/with acute hypercapnic respiratory failure -Patient will remain in the stepdown unit -good response to the use of BIPAP -recheck ABG -if stable, will weaned off BIPAP and maybe just use it QHS -follow blood cultures results, urine culture, strep pneumo and Legionella antigen in urine/sputum culture; so far neg to no growth, will wait for final results.  -WBC's WNL and no further fever; continue Augmentin. So far tolerating antibiotics well and w/o any problem  -continue oxygen supplementation and weaned as tolerated  -follow clinical response   2-Hypertension -Patient blood pressure is reasonably controlled and currently stable. -We will continue current antihypertensive regimen -Avoid hypotension while kidney function recovers -will Follow vital signs trend and further adjust antihypertensive regimen as  needed.   3-Hypothyroidism -Continue Synthroid.  4-Hyperlipidemia -Continue statins  5-Gout -No acute flare -Continue as needed allopurinol as previously taken.    6-Gastroesophageal reflux disease -Patient reporting no nausea -Will continue the use of PPI.  7-Class 2 obesity due to excess calories with body mass index (BMI) of 38.0 to 38.9 in adult -Low calorie diet has been discussed with the patient along with increase physical activity. -Body mass index is 41.1 kg/m.  -With most likely a component of obesity hypoventilation syndrome and possible obstructive sleep apnea. -Patient would benefit of sleep study as an outpatient.  8-Liver lesion -continue outpatient workup by PCP and follow-up with MRI   9-Acute renal failure superimposed on stage 2 chronic kidney disease (HCC)/Hyperkalemia. -Renal function has remained stable today (creatinine 3.95). -Renal service is on board and dictating further evaluation/treatment -Will follow their recommendations.   -Continue avoiding/minimizing nephrotoxic agents- -repeat renal function panel in a.m.   -Patient potassium now within normal limits.  10-type 2 DM: -Most recent A1c catheterized patient into having diabetes with labile of 6.8 -Continue sliding scale insulin a close follow-up on her CBGs while inpatient.    Code Status: full code  Family Communication: husband at bedside  Disposition Plan: Continue Augmentin antibiotic, will wean BiPAP to potentially use just as needed/QHS.  Follow recommendations by nephrology.  Follow clinical improvement.  Patient remains in the hospital until improvement in her renal function and further stabilization of her breathing.  If he remains stable over the next 24 hours might be able to be transferred tomorrow back to the floor with CPAP or BiPAP nightly.   Consultants:  Nephrology  Procedures:  See below for x-ray reports.  Antibiotics:  Vancomycin and aztreonam   08/05/17>>>08/08/17  augmentin 08/08/17  HPI/Subjective: No fever, no chest pain or palpitations reported.  Patient  is more alert, awake and oriented x3 this morning with better interaction and following commands appropriately.  She reports mild discomfort in her abdomen and also mild discomfort in her nose after spending the night on BiPAP. Still with BIPAP in place this morning.  Objective: Vitals:   08/09/17 0500 08/09/17 0600  BP: 116/70 131/86  Pulse: 70 73  Resp: 14 16  Temp:    SpO2: 99% 94%    Intake/Output Summary (Last 24 hours) at 08/09/2017 0817 Last data filed at 08/09/2017 0600 Gross per 24 hour  Intake 2970.33 ml  Output 400 ml  Net 2570.33 ml   Filed Weights   08/07/17 0600 08/08/17 0500 08/09/17 0500  Weight: 102.8 kg (226 lb 10.1 oz) 106.1 kg (233 lb 14.5 oz) 108.6 kg (239 lb 6.7 oz)    Exam:   General: Patient without any fever, reports no chest pain, no nausea vomiting.  She is more alert/awake and with better interaction after spending the night on BiPAP.  She is still using BiPAP this morning but expressed breathing easier.  Reports having a good bowel movement and is just having mild discomfort in her abdomen.    Cardiovascular: S1 and S2, no rubs, no gallops, no JVD.   Respiratory: Improved air movement, no frank crackles appreciated on exam, no wheezing, no using accessory muscles.    Abdomen: Slightly distended, vague tenderness to palpation bilateral upper quadrants, positive bowel sounds, no guarding, no rebound.   Musculoskeletal: Trace edema bilaterally appreciated, TED hoses in place, there is no cyanosis or clubbing on exam.    Data Reviewed: Basic Metabolic Panel: Recent Labs  Lab 08/05/17 1053 08/05/17 1712 08/06/17 0701 08/07/17 0440 08/08/17 0638 08/09/17 0600  NA 141  --  140 136 137  138 141  K 4.4  --  5.0 5.9* 5.9*  5.9* 4.2  CL 105  --  103 101 102  103 106  CO2 24  --  26 25 22  22 22   GLUCOSE 114*  --  148* 166* 138*  138*  101*  BUN 30*  --  31* 38* 44*  44* 44*  CREATININE 1.98*  --  2.11* 2.97* 3.95*  3.97* 3.95*  CALCIUM 9.0  --  8.4* 8.1* 7.8*  7.9* 7.2*  MG  --  1.8  --   --   --   --   PHOS  --  3.9  --   --  8.5* 7.4*   Liver Function Tests: Recent Labs  Lab 08/05/17 1053 08/08/17 0638 08/09/17 0600  AST 19  --   --   ALT 12*  --   --   ALKPHOS 38  --   --   BILITOT 0.8  --   --   PROT 7.8  --   --   ALBUMIN 3.4* 2.7* 2.5*   Recent Labs  Lab 08/05/17 1053  LIPASE 25   CBC: Recent Labs  Lab 08/05/17 1053 08/06/17 0701 08/09/17 0600  WBC 17.4* 13.6* 7.7  HGB 9.5* 8.5* 8.4*  HCT 32.8* 29.0* 29.1*  MCV 91.6 92.1 93.0  PLT 599* 498* 495*   Cardiac Enzymes: Recent Labs  Lab 08/05/17 1053  TROPONINI <0.03   BNP (last 3 results) Recent Labs    08/05/17 1053  BNP 255.0*    CBG: Recent Labs  Lab 08/08/17 0739 08/08/17 1141 08/08/17 1230 08/08/17 2100 08/09/17 0745  GLUCAP 108* 72 113* 115* 77    Studies: US Renal  Result Date: 08/07/2017  CLINICAL DATA:  Acute on chronic renal failure. EXAM: RENAL / URINARY TRACT ULTRASOUND COMPLETE COMPARISON:  None. FINDINGS: Right Kidney: Length: 9.9 cm. 1.3 cm simple cyst is seen in lower pole. Increased echogenicity of renal parenchyma is noted. No mass or hydronephrosis visualized. Left Kidney: Length: 9.9 cm. 6 mm nonobstructive calculus is noted in midpole. Increased echogenicity of renal parenchyma is noted. No mass or hydronephrosis visualized. Bladder: Not visualized due to body habitus. IMPRESSION: Increased echogenicity of renal parenchyma is noted bilaterally consistent with medical renal disease. Nonobstructive left renal calculus. No hydronephrosis or renal obstruction is noted. Electronically Signed   By: Marijo Conception, M.D.   On: 08/07/2017 11:01    Scheduled Meds: . amoxicillin-clavulanate  1 tablet Oral Q12H  . aspirin  81 mg Oral Daily  . chlorhexidine  15 mL Mouth Rinse BID  . fenofibrate  160 mg Oral Daily   . gabapentin  300 mg Oral BID  . heparin  5,000 Units Subcutaneous Q8H  . hydrALAZINE  10 mg Oral Q8H  . insulin aspart  0-5 Units Subcutaneous QHS  . insulin aspart  0-9 Units Subcutaneous TID WC  . labetalol  200 mg Oral BID  . levothyroxine  100 mcg Oral QAC breakfast  . mouth rinse  15 mL Mouth Rinse q12n4p  . pantoprazole  40 mg Oral Daily  . pravastatin  20 mg Oral Daily   Continuous Infusions: . sodium chloride 100 mL/hr at 08/08/17 2000  . furosemide 120 mg (08/09/17 0755)    Time spent: 35 minutes   [facility] Pager [phone]. If 7PM-7AM, please contact night-coverage at [email or web address], password [facility] 08/09/2017, 8:17 AM  LOS: 4 days

## 2017-08-09 NOTE — Progress Notes (Signed)
Subjective: Interval History: Patient is feeling much better.  Presently she is on CPAP.  Still complains of some right-sided abdominal pain.  Objective: Vital signs in last 24 hours: Temp:  [97.9 F (36.6 C)-99 F (37.2 C)] 97.9 F (36.6 C) (04/07 0815) Pulse Rate:  [66-75] 75 (04/07 0946) Resp:  [9-20] 16 (04/07 0600) BP: (106-146)/(68-131) 128/75 (04/07 0946) SpO2:  [90 %-100 %] 97 % (04/07 0815) Weight:  [108.6 kg (239 lb 6.7 oz)] 108.6 kg (239 lb 6.7 oz) (04/07 0500) Weight change: 2.5 kg (5 lb 8.2 oz)  Intake/Output from previous day: 04/06 0701 - 04/07 0700 In: 2970.3 [P.O.:320; I.V.:2588.3; IV Piggyback:62] Out: 400 [Urine:400] Intake/Output this shift: No intake/output data recorded.  General appearance: alert, cooperative and no distress Resp: rales bilaterally Cardio: regular rate and rhythm Extremities: No edema  Lab Results: Recent Labs    08/09/17 0600  WBC 7.7  HGB 8.4*  HCT 29.1*  PLT 495*   BMET:  Recent Labs    08/08/17 0638 08/09/17 0600  NA 137  138 141  K 5.9*  5.9* 4.2  CL 102  103 106  CO2 22  22 22   GLUCOSE 138*  138* 101*  BUN 44*  44* 44*  CREATININE 3.95*  3.97* 3.95*  CALCIUM 7.8*  7.9* 7.2*   No results for input(s): PTH in the last 72 hours. Iron Studies: No results for input(s): IRON, TIBC, TRANSFERRIN, FERRITIN in the last 72 hours.  Studies/Results: US Renal  Result Date: 08/07/2017 CLINICAL DATA:  Acute on chronic renal failure. EXAM: RENAL / URINARY TRACT ULTRASOUND COMPLETE COMPARISON:  None. FINDINGS: Right Kidney: Length: 9.9 cm. 1.3 cm simple cyst is seen in lower pole. Increased echogenicity of renal parenchyma is noted. No mass or hydronephrosis visualized. Left Kidney: Length: 9.9 cm. 6 mm nonobstructive calculus is noted in midpole. Increased echogenicity of renal parenchyma is noted. No mass or hydronephrosis visualized. Bladder: Not visualized due to body habitus. IMPRESSION: Increased echogenicity of renal  parenchyma is noted bilaterally consistent with medical renal disease. Nonobstructive left renal calculus. No hydronephrosis or renal obstruction is noted. Electronically Signed   By: Marijo Conception, M.D.   On: 08/07/2017 11:01    I have reviewed the patient's current medications.  Assessment/Plan: 1] acute kidney injury superimposed on chronic: Possibly ATN/prerenal/vancomycin associated acute kidney injury.  Her renal function seems to be stabilizing.  Her urine output however remains low.  She has about 400 cc of urine output. 2] hyperkalemia: Her potassium is normal. 3] hypertension: Her blood pressure is reasonably controlled 4] chronic kidney disease: Patient with history of renal artery stenosis.  Ultrasound of the kidney showed increased bilateral echogenicity consistent with chronic kidney injury 5] bone and mineral disorder: Calcium is in range but phosphorus is high.  Phosphorus however he is improving. 6] anemia: Her hemoglobin below  our target goal 7] hypothyroidism 8] acidosis: Her pH is 7.29 and PCO2 is 51.4.  Her serum CO2 is 22.  At this moment patient seems to have positive expiratory and metabolic acidosis.  Her pH has improved after she was put on CPAP.  Patient is states that you are thinking about doing a sleep apnea study but has never been done. Plan: We will start patient on sodium bicarb 650 mg p.o. 2 times daily 2] will increase Lasix to 160 mg IV twice daily 3] we will check comprehensive renal panel in the morning.    LOS: 4 days   Jazzie Trampe S 08/09/2017,10:20 AM

## 2017-08-10 ENCOUNTER — Ambulatory Visit (HOSPITAL_COMMUNITY)
Admit: 2017-08-10 | Discharge: 2017-08-10 | Disposition: A | Discharge status: Home / Self Care | Payer: Medicare Other | Attending: Internal Medicine | Admitting: Internal Medicine

## 2017-08-10 ENCOUNTER — Encounter (HOSPITAL_COMMUNITY): Payer: Self-pay

## 2017-08-10 LAB — COMPREHENSIVE METABOLIC PANEL
ALBUMIN: 2.4 g/dL — AB (ref 3.5–5.0)
ALT: 14 U/L (ref 14–54)
ANION GAP: 11 (ref 5–15)
AST: 14 U/L — AB (ref 15–41)
Alkaline Phosphatase: 32 U/L — ABNORMAL LOW (ref 38–126)
BILIRUBIN TOTAL: 0.7 mg/dL (ref 0.3–1.2)
BUN: 40 mg/dL — AB (ref 6–20)
CO2: 26 mmol/L (ref 22–32)
Calcium: 7.3 mg/dL — ABNORMAL LOW (ref 8.9–10.3)
Chloride: 107 mmol/L (ref 101–111)
Creatinine, Ser: 3.42 mg/dL — ABNORMAL HIGH (ref 0.44–1.00)
GFR calc Af Amer: 15 mL/min — ABNORMAL LOW (ref >60)
GFR calc non Af Amer: 13 mL/min — ABNORMAL LOW (ref >60)
GLUCOSE: 131 mg/dL — AB (ref 65–99)
POTASSIUM: 3.9 mmol/L (ref 3.5–5.1)
SODIUM: 144 mmol/L (ref 135–145)
Total Protein: 6.3 g/dL — ABNORMAL LOW (ref 6.5–8.1)

## 2017-08-10 LAB — IRON AND TIBC
Iron: 21 ug/dL — ABNORMAL LOW (ref 28–170)
Saturation Ratios: 7 % — ABNORMAL LOW (ref 10.4–31.8)
TIBC: 305 ug/dL (ref 250–450)
UIBC: 284 ug/dL

## 2017-08-10 LAB — GLUCOSE, CAPILLARY
GLUCOSE-CAPILLARY: 79 mg/dL (ref 65–99)
GLUCOSE-CAPILLARY: 123 mg/dL — AB (ref 65–99)
Glucose-Capillary: 121 mg/dL — ABNORMAL HIGH (ref 65–99)
Glucose-Capillary: 106 mg/dL — ABNORMAL HIGH (ref 65–99)
Glucose-Capillary: 133 mg/dL — ABNORMAL HIGH (ref 65–99)

## 2017-08-10 LAB — PTH, INTACT AND CALCIUM
CALCIUM TOTAL (PTH): 7.5 mg/dL — AB (ref 8.7–10.3)
PTH: 127 pg/mL — AB (ref 15–65)

## 2017-08-10 LAB — FERRITIN: Ferritin: 290 ng/mL (ref 11–307)

## 2017-08-10 MED ORDER — FAMOTIDINE 20 MG PO TABS
20.0000 mg | ORAL_TABLET | Freq: Every day | ORAL | Status: DC
  Administered 2017-08-10 – 2017-08-13 (×4): 20 mg via ORAL
  Filled 2017-08-10 (×4): qty 1

## 2017-08-10 MED ORDER — METOPROLOL TARTRATE 5 MG/5ML IV SOLN
5.0000 mg | Freq: Once | INTRAVENOUS | Status: AC
Start: 2017-08-10 — End: 2017-08-10
  Administered 2017-08-10: 5 mg via INTRAVENOUS
  Filled 2017-08-10: qty 5

## 2017-08-10 MED ORDER — FUROSEMIDE 10 MG/ML IJ SOLN
120.0000 mg | Freq: Two times a day (BID) | INTRAVENOUS | Status: DC
  Administered 2017-08-10: 120 mg via INTRAVENOUS
  Filled 2017-08-10 (×2): qty 12

## 2017-08-10 MED ORDER — LORAZEPAM 2 MG/ML IJ SOLN
0.5000 mg | Freq: Once | INTRAMUSCULAR | Status: AC
  Administered 2017-08-10: 0.5 mg via INTRAVENOUS
  Filled 2017-08-10: qty 1

## 2017-08-10 NOTE — Progress Notes (Signed)
Subjective: Interval History: Patient is feeling much better but still she has difficulty breathing.  Patient denies any nausea or vomiting.  Objective: Vital signs in last 24 hours: Temp:  [97.5 F (36.4 C)-98.7 F (37.1 C)] 98.2 F (36.8 C) (04/08 0717) Pulse Rate:  [61-78] 73 (04/08 0717) Resp:  [10-19] 19 (04/08 0717) BP: (89-147)/(64-85) 133/72 (04/08 0500) SpO2:  [96 %-100 %] 99 % (04/08 0717) Weight:  [106.8 kg (235 lb 7.2 oz)] 106.8 kg (235 lb 7.2 oz) (04/08 0411) Weight change: -1.8 kg (-3 lb 15.5 oz)  Intake/Output from previous day: 04/07 0701 - 04/08 0700 In: 2981 [P.O.:615; I.V.:2300; IV Piggyback:66] Out: 2175 [Urine:2175] Intake/Output this shift: No intake/output data recorded.  General appearance: alert, cooperative and no distress Resp: rales bilaterally Cardio: regular rate and rhythm Extremities: No edema  Lab Results: Recent Labs    08/09/17 0600  WBC 7.7  HGB 8.4*  HCT 29.1*  PLT 495*   BMET:  Recent Labs    08/09/17 0600 08/10/17 0442  NA 141 144  K 4.2 3.9  CL 106 107  CO2 22 26  GLUCOSE 101* 131*  BUN 44* 40*  CREATININE 3.95* 3.42*  CALCIUM 7.2* 7.3*   No results for input(s): PTH in the last 72 hours. Iron Studies:  Recent Labs    08/09/17 0600  IRON 21*  TIBC 305  FERRITIN 290    Studies/Results: No results found.  I have reviewed the patient's current medications.  Assessment/Plan: 1] acute kidney injury superimposed on chronic: Possibly ATN/prerenal/vancomycin associated acute kidney injury.  Her renal function started improving.  Patient is nonoliguric.  She has 2100 cc of urine output..  S he is on Lasix. 2] hyperkalemia: Her potassium is normal. 3] hypertension: Her blood pressure is reasonably controlled 4] chronic kidney disease: Patient with history of renal artery stenosis.  Ultrasound of the kidney showed increased bilateral echogenicity consistent with chronic kidney injury 5] bone and mineral disorder:  Calcium is in range but phosphorus is high.  Phosphorus however he is improving. 6] anemia: Her hemoglobin below  our target goal.  Her iron saturation is low and she might have superimposed iron deficiency anemia. 7] hypothyroidism 8] acidosis: Her pH is 7.29 and PCO2 is 51.4.  A combination of respiratory and metabolic acidosis.  Her CO2 has improved to 26.  Presently patient is on nasal oxygen. Plan: We will continue his present management 2] will decrease Lasix to 120 mg IV twice daily 3] we will check  renal panel in the morning.    LOS: 5 days   Kathleen Vance S 08/10/2017,9:18 AM

## 2017-08-10 NOTE — Plan of Care (Signed)
Patient assisted OOB up to chair for all 3 meals this shift, patient able to tolerate standing and ambulating a few feet with her cane to toilet. Patient still noted weak and unsteady and requires 2 person stand-by assist for safety. Her motivation to ambulate and get OOB to chair has improved.

## 2017-08-10 NOTE — Evaluation (Signed)
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  Patient reports that for the last 2-3 days she has been experiencing shortness of breath on exertion and also at rest, express having chills and measured temperature up to 102 the night prior to admission  Clinical Impression  Kathleen Vance is a 67 yo female who normally ambulates in the  community  using her cane.  She has developed significant weakness in the last several weeks.  Therapist recommends a short rehab stay to increase her I and safety prior to going home.      Follow Up Recommendations SNF    Equipment Recommendations  None recommended by PT    Recommendations for Other Services       Precautions / Restrictions Precautions Precautions: None Restrictions Weight Bearing Restrictions: No      Mobility  Bed Mobility    PT in chair when therapist arrived stated moderate assist from nursing staff.               Transfers Overall transfer level: Needs assistance Equipment used: Rolling walker (2 wheeled) Transfers: Sit to/from [facility] Sit to Stand: Supervision Stand pivot transfers: Min assist       General transfer comment: PT completed standing exercises and was to fatigued to walk   Ambulation/Gait    PT became to fatigue to ambulate at this time after completing exercises.                      Pertinent Vitals/Pain Pain Assessment: No/denies pain    Home  Living Family/patient expects to be discharged to:: Private residence Living Arrangements: Spouse/significant other Available Help at Discharge: Family Type of Home: House Home Access: Stairs to enter     Home Layout: One level Home Equipment: Kasandra Knudsen - single point      Prior Function Level of Independence: Independent with assistive device(s)                  Extremity/Trunk Assessment        Lower Extremity Assessment Lower Extremity Assessment: Generalized weakness(LE generally 3+/5)       Communication   Communication: No difficulties  Cognition Arousal/Alertness: Awake/alert Behavior During Therapy: WFL for tasks assessed/performed Overall Cognitive Status: Within Functional Limits for tasks assessed                                           Exercises General Exercises - Lower Extremity Long Arc Quad: Strengthening;Both;5 reps Hip Flexion/Marching: Strengthening;Both;10 reps Mini-Sqauts: Strengthening;Both;10 reps   Assessment/Plan    PT Assessment Patient needs continued PT services  PT Problem List Decreased strength;Decreased activity tolerance;Decreased balance;Decreased mobility       PT Treatment Interventions DME instruction;Gait training;Therapeutic activities;Therapeutic exercise;Balance training    PT Goals (Current goals can be found in the Care Plan section)  Acute Rehab PT Goals Patient Stated Goal: to get stronger to go home  PT Goal Formulation: With patient/family  Potential to Achieve Goals: Good    Frequency Min 4X/week   Barriers to discharge    none          End of Session Equipment Utilized During Treatment: Gait belt Activity Tolerance: Patient limited by fatigue Patient left: in chair;with call bell/phone within reach;with family/visitor present   PT Visit Diagnosis: Unsteadiness on feet (R26.81);Muscle weakness (generalized) (M62.81)    Time: 1901-2224 PT Time Calculation (min) (ACUTE ONLY): 31  min   Charges:   PT Evaluation $PT Eval Low Complexity: 1 Low     PT G CodesRayetta Humphrey, PT CLT [phone] 08/10/2017, 11:51 AM

## 2017-08-10 NOTE — Progress Notes (Signed)
PROGRESS NOTE    Kathleen Vance  XKG:[id number] DOB: 13-Apr-1951 DOA: 08/05/2017 PCP: Celene Squibb, MD    Brief Narrative:  67 year old female who presented with dyspnea, fever and malaise. She does have significant past medical history for dyslipidemia, hypertension, type 2 diabetes mellitus, hypothyroidism, and chronic kidney disease stage II. Patient reported 2-3 days of worsening dyspnea, associated with chills and fever, associated with lower extremity edema. On the initial physical examination her blood pressure 171/93, heart rate 87, respiratory rate 14, oxygen saturation 95%, temperature 98.7. She had moist mucous membranes, lungs were clear to auscultation bilaterally, heart S1-S2 present and rhythmic, abdomen protuberant, 1+ lower extremity edema. Sodium 141, potassium 4.4, chloride 105, bicarbonate 24, glucose 114, BUN 30, creatinine 1.98, white count 17.4, hemoglobin 9.5, hematocrit 32.8, platelets 599, urinalysis pH 5.0, 100 protein and specific gravity 1.017, red cells 0-5, white cells 6-30. Chest x-ray with right rotation, good penetration, opacity at the left base. EKG normal sinus rhythm, normal axis and normal intervals.   Patient was admitted to the hospital with the working diagnosis of left lower lobe pneumonia, complicated by acute kidney injury on chronic kidney disease.  Assessment & Plan:   Principal Problem:   HCAP (healthcare-associated pneumonia) Active Problems:   Hypertension   Hypothyroidism   Hyperlipidemia   Gout   Gastroesophageal reflux disease   Class 2 obesity due to excess calories with body mass index (BMI) of 38.0 to 38.9 in adult   Liver lesion   Acute renal failure superimposed on stage 2 chronic kidney disease (HCC)   Acidosis   Acute on chronic respiratory failure with hypercapnia ([place])   1. Left lower lobe pneumonia with acute hypercapnic and hypoxic respiratory failure. Patient with improved oxygenation, continue supplemental 02 per Sarah Ann to  target 02 saturation greater than 92%. Antibiotic therapy with Augmentin #3 (treated initially with Vancomycin and Zosyn #2) , has remained afebrile, wbc trending down, cultures have been no growth. Continue to use bipap during the night. Last pH 7,26.   2. Acute kidney injury on chronic kidney disease stage II with non gap metabolic acidosis and hyperkalemia. Serum creatinine at 3,42, with serum K at 3,9 and serum bicarbonate at 26. Urine output over last 24 hours, 2,175. Will continue to target a negative fluid balance, discontinue IV fluids. On high dose of furosemide. Hold on further bicarbonate repletion. Avoid nephrotoxic medications and hypotension. Dc pantoprazole.   3. Type 2 diabetes mellitus. Continue glucose cover and monitoring with insulin sliding scale, poor oral intake, capillary glucose 77, 126, 118, 106.   4. Hypertension. Blood pressure 702 systolic, will continue close monitoring. Continue labetalol 200 mg po bid and hydralazine.   5. Hypothyroidism. Continue levothyroxine.   6. Obesity with dyslipidemia. Continue statin and fenofibrate therapy. Out of bed to chair tid with meals. Physical therapy as tolerated.    DVT prophylaxis: scd/ sq heparin  Code Status:  full Family Communication: no family at the bedside Disposition Plan: home when renal function improves   Consultants:   Nephrology   Procedures:     Antimicrobials:   Augmentin    Subjective: Patient feeling very weak and deconditioned, positive dyspnea, no chest pain, no nausea or vomiting. Poor oral intake. Had episodes of anxiety.   Objective: Vitals:   08/10/17 0411 08/10/17 0415 08/10/17 0500 08/10/17 0717  BP:  130/80 133/72   Pulse:  65 67 73  Resp:  13 10 19   Temp:    98.2 F (36.8 C)  TempSrc:    Oral  SpO2:  98% 100% 99%  Weight: 106.8 kg (235 lb 7.2 oz)     Height:        Intake/Output Summary (Last 24 hours) at 08/10/2017 0740 Last data filed at 08/10/2017 0500 Gross per 24 hour    Intake 2980.99 ml  Output 2175 ml  Net 805.99 ml   Filed Weights   08/08/17 0500 08/09/17 0500 08/10/17 0411  Weight: 106.1 kg (233 lb 14.5 oz) 108.6 kg (239 lb 6.7 oz) 106.8 kg (235 lb 7.2 oz)    Examination:   General: deconditioned and ill looking appearing Neurology: Awake and alert, non focal  E ENT: mild pallor, no icterus, oral mucosa moist Cardiovascular: No JVD. S1-S2 present, rhythmic, no gallops, rubs, or murmurs. Trace lower extremity edema. Pulmonary: decreased breath sounds bilaterally at bases, poor air movement, no wheezing, scattered rhonchi and rales. Gastrointestinal. Abdomen with no organomegaly, non tender, no rebound or guarding Skin. No rashes Musculoskeletal: no joint deformities     Data Reviewed: I have personally reviewed following labs and imaging studies  CBC: Recent Labs  Lab 08/05/17 1053 08/06/17 0701 08/09/17 0600  WBC 17.4* 13.6* 7.7  HGB 9.5* 8.5* 8.4*  HCT 32.8* 29.0* 29.1*  MCV 91.6 92.1 93.0  PLT 599* 498* 032*   Basic Metabolic Panel: Recent Labs  Lab 08/05/17 1712 08/06/17 0701 08/07/17 0440 08/08/17 0638 08/09/17 0600 08/10/17 0442  NA  --  140 136 137  138 141 144  K  --  5.0 5.9* 5.9*  5.9* 4.2 3.9  CL  --  103 101 102  103 106 107  CO2  --  26 25 22  22 22 26   GLUCOSE  --  148* 166* 138*  138* 101* 131*  BUN  --  31* 38* 44*  44* 44* 40*  CREATININE  --  2.11* 2.97* 3.95*  3.97* 3.95* 3.42*  CALCIUM  --  8.4* 8.1* 7.8*  7.9* 7.2* 7.3*  MG 1.8  --   --   --   --   --   PHOS 3.9  --   --  8.5* 7.4*  --    GFR: Estimated Creatinine Clearance: 19 mL/min (A) (by C-G formula based on SCr of 3.42 mg/dL (H)). Liver Function Tests: Recent Labs  Lab 08/05/17 1053 08/08/17 0638 08/09/17 0600 08/10/17 0442  AST 19  --   --  14*  ALT 12*  --   --  14  ALKPHOS 38  --   --  32*  BILITOT 0.8  --   --  0.7  PROT 7.8  --   --  6.3*  ALBUMIN 3.4* 2.7* 2.5* 2.4*   Recent Labs  Lab 08/05/17 1053  LIPASE 25    No results for input(s): AMMONIA in the last 168 hours. Coagulation Profile: No results for input(s): INR, PROTIME in the last 168 hours. Cardiac Enzymes: Recent Labs  Lab 08/05/17 1053  TROPONINI <0.03   BNP (last 3 results) No results for input(s): PROBNP in the last 8760 hours. HbA1C: No results for input(s): HGBA1C in the last 72 hours. CBG: Recent Labs  Lab 08/09/17 0745 08/09/17 1140 08/09/17 1646 08/09/17 2116 08/10/17 0716  GLUCAP 77 126* 126* 118* 106*   Lipid Profile: No results for input(s): CHOL, HDL, LDLCALC, TRIG, CHOLHDL, LDLDIRECT in the last 72 hours. Thyroid Function Tests: No results for input(s): TSH, T4TOTAL, FREET4, T3FREE, THYROIDAB in the last 72 hours. Anemia Panel: Recent Labs  08/09/17 0600  FERRITIN 290  TIBC 305  IRON 21*      Radiology Studies: I have reviewed all of the imaging during this hospital visit personally     Scheduled Meds: . amoxicillin-clavulanate  1 tablet Oral Q12H  . aspirin  81 mg Oral Daily  . chlorhexidine  15 mL Mouth Rinse BID  . fenofibrate  160 mg Oral Daily  . gabapentin  300 mg Oral BID  . heparin  5,000 Units Subcutaneous Q8H  . hydrALAZINE  10 mg Oral Q8H  . insulin aspart  0-5 Units Subcutaneous QHS  . insulin aspart  0-9 Units Subcutaneous TID WC  . labetalol  200 mg Oral BID  . levothyroxine  100 mcg Oral QAC breakfast  . mouth rinse  15 mL Mouth Rinse q12n4p  . pantoprazole  40 mg Oral Daily  . pravastatin  20 mg Oral Daily  . sodium bicarbonate  650 mg Oral BID   Continuous Infusions: . sodium chloride 100 mL/hr at 08/09/17 2331  . furosemide Stopped (08/09/17 2002)     LOS: 5 days        Jaydyn Menon Gerome Apley, MD [facility] Pager [phone]

## 2017-08-10 NOTE — Progress Notes (Signed)
Pt HR in 130s and sustaining. Thought it may have been anxiety related d/t wearing BiPAP. However, 0.5mg  ativan ordered by midlevel was given and effective. Pt resting peacefully with BiPAP mask on and in place. HR still remains 130s. Lopressor 5mg  IV given per Midlevel order. Will continue to monitor HR which is ST at this time.

## 2017-08-10 NOTE — Progress Notes (Signed)
0905 Patient bathed, gown, & linen changed and assisted OOB to chair. Purewick changed x 2 times d/t 2 small, soft, incontinent BMs. Patient instructed and encouraged to be OOB and up in chair at least TID with meals, patient has agreed to try. TEDs on BLE. Purewick canister changed and 700cc straw yellow urine noted in canister and documented to I&O flowsheet. Patient comfortable at this time watching TV with bedside table & call bell within reach.

## 2017-08-11 LAB — RENAL FUNCTION PANEL
ALBUMIN: 2.5 g/dL — AB (ref 3.5–5.0)
Anion gap: 13 (ref 5–15)
BUN: 37 mg/dL — AB (ref 6–20)
CHLORIDE: 104 mmol/L (ref 101–111)
CO2: 27 mmol/L (ref 22–32)
Calcium: 7.6 mg/dL — ABNORMAL LOW (ref 8.9–10.3)
Creatinine, Ser: 2.8 mg/dL — ABNORMAL HIGH (ref 0.44–1.00)
GFR, EST AFRICAN AMERICAN: 19 mL/min — AB (ref >60)
GFR, EST NON AFRICAN AMERICAN: 16 mL/min — AB (ref >60)
Glucose, Bld: 114 mg/dL — ABNORMAL HIGH (ref 65–99)
PHOSPHORUS: 5.5 mg/dL — AB (ref 2.5–4.6)
POTASSIUM: 3.4 mmol/L — AB (ref 3.5–5.1)
Sodium: 144 mmol/L (ref 135–145)

## 2017-08-11 LAB — VITAMIN D 25 HYDROXY (VIT D DEFICIENCY, FRACTURES): Vit D, 25-Hydroxy: 26.3 ng/mL — ABNORMAL LOW (ref 30.0–100.0)

## 2017-08-11 LAB — BASIC METABOLIC PANEL
ANION GAP: 11 (ref 5–15)
BUN: 38 mg/dL — ABNORMAL HIGH (ref 6–20)
CALCIUM: 7.5 mg/dL — AB (ref 8.9–10.3)
CO2: 28 mmol/L (ref 22–32)
Chloride: 104 mmol/L (ref 101–111)
Creatinine, Ser: 2.91 mg/dL — ABNORMAL HIGH (ref 0.44–1.00)
GFR calc non Af Amer: 16 mL/min — ABNORMAL LOW (ref >60)
GFR, EST AFRICAN AMERICAN: 18 mL/min — AB (ref >60)
GLUCOSE: 115 mg/dL — AB (ref 65–99)
POTASSIUM: 3.4 mmol/L — AB (ref 3.5–5.1)
Sodium: 143 mmol/L (ref 135–145)

## 2017-08-11 LAB — GLUCOSE, CAPILLARY
GLUCOSE-CAPILLARY: 89 mg/dL (ref 65–99)
GLUCOSE-CAPILLARY: 156 mg/dL — AB (ref 65–99)
Glucose-Capillary: 100 mg/dL — ABNORMAL HIGH (ref 65–99)
Glucose-Capillary: 132 mg/dL — ABNORMAL HIGH (ref 65–99)

## 2017-08-11 MED ORDER — FUROSEMIDE 10 MG/ML IJ SOLN
40.0000 mg | Freq: Two times a day (BID) | INTRAMUSCULAR | Status: DC
  Administered 2017-08-11 – 2017-08-12 (×4): 40 mg via INTRAVENOUS
  Filled 2017-08-11 (×4): qty 4

## 2017-08-11 MED ORDER — POTASSIUM CHLORIDE CRYS ER 20 MEQ PO TBCR
30.0000 meq | EXTENDED_RELEASE_TABLET | Freq: Two times a day (BID) | ORAL | Status: DC
  Administered 2017-08-11 – 2017-08-12 (×4): 30 meq via ORAL
  Filled 2017-08-11 (×4): qty 1

## 2017-08-11 NOTE — Care Management Note (Signed)
Case Management Note  Patient Details  Name: TESSLA SPURLING MRN: [id number] Date of Birth: [birth year]   If discussed at Long Length of Stay Meetings, dates discussed:  08/11/2017  Additional Comments:  Angelyna Henderson, Chauncey Reading, RN 08/11/2017, 12:04 PM

## 2017-08-11 NOTE — Progress Notes (Signed)
PROGRESS NOTE    Kathleen Vance  DSK:[id number] DOB: [birth year] DOA: 08/05/2017 PCP: Celene Squibb, MD    Brief Narrative:  67 year old female who presented with dyspnea, fever and malaise. She does have significant past medical history for dyslipidemia, hypertension, type 2 diabetes mellitus, hypothyroidism, and chronic kidney disease stage II. Patient reported 2-3 days of worsening dyspnea, associated with chills and fever, associated with lower extremity edema. On the initial physical examination her blood pressure 171/93, heart rate 87, respiratory rate 14, oxygen saturation 95%, temperature 98.7. She had moist mucous membranes, lungs were clear to auscultation bilaterally, heart S1-S2 present and rhythmic, abdomen protuberant, 1+ lower extremity edema. Sodium 141, potassium 4.4, chloride 105, bicarbonate 24, glucose 114, BUN 30, creatinine 1.98, white count 17.4, hemoglobin 9.5, hematocrit 32.8, platelets 599, urinalysis pH 5.0, 100 protein and specific gravity 1.017, red cells 0-5, white cells 6-30. Chest x-ray with right rotation, good penetration, opacity at the left base. EKG normal sinus rhythm, normal axis and normal intervals.   Patient was admitted to the hospital with the working diagnosis of left lower lobe pneumonia, complicated by acute kidney injury on chronic kidney disease.   Assessment & Plan:   Principal Problem:   HCAP (healthcare-associated pneumonia) Active Problems:   Hypertension   Hypothyroidism   Hyperlipidemia   Gout   Gastroesophageal reflux disease   Class 2 obesity due to excess calories with body mass index (BMI) of 38.0 to 38.9 in adult   Liver lesion   Acute renal failure superimposed on stage 2 chronic kidney disease (HCC)   Acidosis   Acute on chronic respiratory failure with hypercapnia ([place])   1. Left lower lobe pneumonia with acute hypercapnic and hypoxic respiratory failure. Has been out of bed, continue to improve dyspnea, not back to her  baseline. Will hold on Bipap and continue to wean supplemental oxygen per Page to target 02 saturation greater than 92%. Continue antibiotic therapy with Augmentin #4 (on admission had Vancomycin and Zosyn #2), total days 6/8.  Wbc at 7.7. Cultures remained with no growth.   2. Acute kidney injury on chronic kidney disease stage II with non gap metabolic acidosis and hyperkalemia. Renal function continue to improved with serum cr down to 2,8 with K at 3,4 and serum bicarbonate at 27, follow on renal panel in am. Urine output over last 24 hours 2,300 ml. On furosemide 40 mg bid plus K supplementation.   3. Type 2 diabetes mellitus. Capillary glucose 106, 133, 79, 123, 100. Poor oral intake, continue insulin sliding scale for glucose cover and monitoring.   4. Hypertension with rebound tachycardia. Patient refused b blocker last night and developed rebound tachycardia, this am improved heart rate, will continue labetalol and hydralazine. Continue telemetry monitoring.   5. Hypothyroidism. On levothyroxine.   6. Obesity with dyslipidemia. On pravastatin and fenofibrate with good toleration. Follow with physical therapy recommendations, will need snf.   DVT prophylaxis: scd/ sq heparin  Code Status:  full Family Communication: no family at the bedside Disposition Plan: home when renal function improves   Consultants:   Nephrology   Procedures:     Antimicrobials:   Augmentin    Subjective: Patient continue to be very weak and deconditioned, developed rebound tachycardia last night, associated with anxiety, refused to take oral antihypertensive medications. This am more calm.    Objective: Vitals:   08/11/17 0300 08/11/17 0400 08/11/17 0500 08/11/17 0726  BP: 120/79 109/73    Pulse: 70 69  69  Resp: 14 14  10   Temp:  98 F (36.7 C)  98.3 F (36.8 C)  TempSrc:  Axillary  Axillary  SpO2: 98% 98%  100%  Weight:   103.8 kg (228 lb 13.4 oz)   Height:         Intake/Output Summary (Last 24 hours) at 08/11/2017 0749 Last data filed at 08/11/2017 0500 Gross per 24 hour  Intake 480 ml  Output 2300 ml  Net -1820 ml   Filed Weights   08/09/17 0500 08/10/17 0411 08/11/17 0500  Weight: 108.6 kg (239 lb 6.7 oz) 106.8 kg (235 lb 7.2 oz) 103.8 kg (228 lb 13.4 oz)    Examination:   General: deconditioned Neurology: Awake and alert, non focal  E ENT: mild pallor, no icterus, oral mucosa moist Cardiovascular: No JVD. S1-S2 present, rhythmic, no gallops, rubs, or murmurs. Trace lower extremity edema. Pulmonary: decreased breath sounds bilaterally at bases, but no wheezing, rhonchi or rales. Gastrointestinal. Abdomen protuberant no organomegaly, non tender, no rebound or guarding Skin. No rashes Musculoskeletal: no joint deformities     Data Reviewed: I have personally reviewed following labs and imaging studies  CBC: Recent Labs  Lab 08/05/17 1053 08/06/17 0701 08/09/17 0600  WBC 17.4* 13.6* 7.7  HGB 9.5* 8.5* 8.4*  HCT 32.8* 29.0* 29.1*  MCV 91.6 92.1 93.0  PLT 599* 498* 283*   Basic Metabolic Panel: Recent Labs  Lab 08/05/17 1712  08/07/17 0440 08/08/17 0638 08/09/17 0600 08/10/17 0442 08/11/17 0407  NA  --    < > 136 137  138 141 144 144  143  K  --    < > 5.9* 5.9*  5.9* 4.2 3.9 3.4*  3.4*  CL  --    < > 101 102  103 106 107 104  104  CO2  --    < > 25 22  22 22 26 27  28   GLUCOSE  --    < > 166* 138*  138* 101* 131* 114*  115*  BUN  --    < > 38* 44*  44* 44* 40* 37*  38*  CREATININE  --    < > 2.97* 3.95*  3.97* 3.95* 3.42* 2.80*  2.91*  CALCIUM  --    < > 8.1* 7.8*  7.9* 7.2*  7.5* 7.3* 7.6*  7.5*  MG 1.8  --   --   --   --   --   --   PHOS 3.9  --   --  8.5* 7.4*  --  5.5*   < > = values in this interval not displayed.   GFR: Estimated Creatinine Clearance: 22 mL/min (A) (by C-G formula based on SCr of 2.91 mg/dL (H)). Liver Function Tests: Recent Labs  Lab 08/05/17 1053 08/08/17 0638  08/09/17 0600 08/10/17 0442 08/11/17 0407  AST 19  --   --  14*  --   ALT 12*  --   --  14  --   ALKPHOS 38  --   --  32*  --   BILITOT 0.8  --   --  0.7  --   PROT 7.8  --   --  6.3*  --   ALBUMIN 3.4* 2.7* 2.5* 2.4* 2.5*   Recent Labs  Lab 08/05/17 1053  LIPASE 25   No results for input(s): AMMONIA in the last 168 hours. Coagulation Profile: No results for input(s): INR, PROTIME in the last 168 hours. Cardiac Enzymes: Recent Labs  Lab  08/05/17 1053  TROPONINI <0.03   BNP (last 3 results) No results for input(s): PROBNP in the last 8760 hours. HbA1C: No results for input(s): HGBA1C in the last 72 hours. CBG: Recent Labs  Lab 08/10/17 0716 08/10/17 1135 08/10/17 1607 08/10/17 2120 08/11/17 0721  GLUCAP 106* 133* 79 123* 100*   Lipid Profile: No results for input(s): CHOL, HDL, LDLCALC, TRIG, CHOLHDL, LDLDIRECT in the last 72 hours. Thyroid Function Tests: No results for input(s): TSH, T4TOTAL, FREET4, T3FREE, THYROIDAB in the last 72 hours. Anemia Panel: Recent Labs    08/09/17 0600  FERRITIN 290  TIBC 305  IRON 21*      Radiology Studies: I have reviewed all of the imaging during this hospital visit personally     Scheduled Meds: . amoxicillin-clavulanate  1 tablet Oral Q12H  . aspirin  81 mg Oral Daily  . chlorhexidine  15 mL Mouth Rinse BID  . famotidine  20 mg Oral QHS  . fenofibrate  160 mg Oral Daily  . gabapentin  300 mg Oral BID  . heparin  5,000 Units Subcutaneous Q8H  . hydrALAZINE  10 mg Oral Q8H  . insulin aspart  0-5 Units Subcutaneous QHS  . insulin aspart  0-9 Units Subcutaneous TID WC  . labetalol  200 mg Oral BID  . levothyroxine  100 mcg Oral QAC breakfast  . mouth rinse  15 mL Mouth Rinse q12n4p  . pravastatin  20 mg Oral Daily   Continuous Infusions: . furosemide Stopped (08/10/17 1954)     LOS: 6 days        Crysten Kaman Gerome Apley, MD [facility] Pager [phone]

## 2017-08-11 NOTE — Progress Notes (Signed)
Subjective: Interval History: Patient states that she is feeling much better.  No difficulty breathing.  Denies any nausea or vomiting.  Objective: Vital signs in last 24 hours: Temp:  [98 F (36.7 C)-98.4 F (36.9 C)] 98.3 F (36.8 C) (04/09 0726) Pulse Rate:  [65-138] 69 (04/09 0726) Resp:  [9-21] 10 (04/09 0726) BP: (101-158)/(73-102) 109/73 (04/09 0400) SpO2:  [95 %-100 %] 100 % (04/09 0726) Weight:  [103.8 kg (228 lb 13.4 oz)] 103.8 kg (228 lb 13.4 oz) (04/09 0500) Weight change: -3 kg (-6 lb 9.8 oz)  Intake/Output from previous day: 04/08 0701 - 04/09 0700 In: 480 [P.O.:480] Out: 2300 [Urine:2300] Intake/Output this shift: No intake/output data recorded.  General appearance: alert, cooperative and no distress Resp: rales bilaterally Cardio: regular rate and rhythm Extremities: No edema  Lab Results: Recent Labs    08/09/17 0600  WBC 7.7  HGB 8.4*  HCT 29.1*  PLT 495*   BMET:  Recent Labs    08/10/17 0442 08/11/17 0407  NA 144 144  143  K 3.9 3.4*  3.4*  CL 107 104  104  CO2 26 27  28   GLUCOSE 131* 114*  115*  BUN 40* 37*  38*  CREATININE 3.42* 2.80*  2.91*  CALCIUM 7.3* 7.6*  7.5*   Recent Labs    08/09/17 0600  PTH 127*  Comment   Iron Studies:  Recent Labs    08/09/17 0600  IRON 21*  TIBC 305  FERRITIN 290    Studies/Results: No results found.  I have reviewed the patient'Vance current medications.  Assessment/Plan: 1] acute kidney injury superimposed on chronic: Possibly ATN/prerenal/vancomycin associated acute kidney injury.  Her renal function continued to improve.  Her creatinine is 2.80.  Patient at this moment does not have any uremic signs and symptoms. 2] hypokalemia: Potassium is low this morning. 3] hypertension: Her blood pressure is reasonably controlled 4] chronic kidney disease: Patient with history of renal artery stenosis.  Ultrasound of the kidney showed increased bilateral echogenicity consistent with chronic  kidney injury 5] bone and mineral disorder: Calcium is in range but phosphorus is high.  Her phosphorus however continued to improve.  6] anemia: Her hemoglobin below  our target goal.  Her iron saturation is low and she might have superimposed iron deficiency anemia. 7] hypothyroidism 8] acidosis: Improving. Plan: 1] we will continue his present management 2] will decrease Lasix to 40 mg IV twice daily 3] we will start her on KCl 30 mEq p.o. twice daily 4] we will check  renal panel in the morning.    LOS: 6 days   Kathleen Vance 08/11/2017,7:52 AM

## 2017-08-11 NOTE — Progress Notes (Signed)
Pt refused to wear CPAP tonight stating that "they said they wasn't going to put that on me again". I reviewed the chart and no mentioned off keeping pt off. Pt spo2 99% on [place]. RN Olena Heckle informed. Will continue to monitor pt for tonight

## 2017-08-11 NOTE — Progress Notes (Signed)
Physical Therapy Treatment Patient Details Name: Kathleen Vance MRN: [id number] DOB: 09/18/1950 Today's Date: 08/11/2017    History of Present Illness Kathleen Vance is a 67 year old female with a past medical history significant for hyperlipidemia, hypertension, type 2 diabetes, hypothyroidism, gout, renal artery stenosis, chronic kidney disease is stage II and a recent admission secondary to noncardiac chest pain which significant concern for gastroesophageal reflux disease; who presented secondary to increased shortness of breath, fever and general malaise.  Patient reports that for the last 2-3 days she has been experiencing shortness of breath on exertion and also at rest, express having chills and measured temperature up to 102 the night prior to admission    PT Comments    Patient presents up in chair, agreeable for therapy and her family members in room.  Patient demonstrates fair/good return for completing BLE exercises with verbal cues, limited to standing for 10-20 seconds due to c/o that she may urinate, perwick in place - RN notified.  Patient will benefit from continued physical therapy in hospital and recommended venue below to increase strength, balance, endurance for safe ADLs and gait.    Follow Up Recommendations  SNF;Supervision for mobility/OOB     Equipment Recommendations  None recommended by PT    Recommendations for Other Services       Precautions / Restrictions Precautions Precautions: Fall Restrictions Weight Bearing Restrictions: No    Mobility  Bed Mobility               General bed mobility comments: Patient presents up in chair (assisted by RN)  Transfers Overall transfer level: Needs assistance Equipment used: Standard walker Transfers: Sit to/from Stand Sit to Stand: Min assist            Ambulation/Gait                 Stairs            Wheelchair Mobility    Modified Rankin (Stroke Patients Only)        Balance Overall balance assessment: Needs assistance Sitting-balance support: Feet supported;No upper extremity supported Sitting balance-Leahy Scale: Good     Standing balance support: Bilateral upper extremity supported;During functional activity Standing balance-Leahy Scale: Fair                              Cognition Arousal/Alertness: Awake/alert Behavior During Therapy: WFL for tasks assessed/performed Overall Cognitive Status: Within Functional Limits for tasks assessed                                        Exercises General Exercises - Lower Extremity Long Arc Quad: Seated;AROM;Strengthening;Both;10 reps Hip Flexion/Marching: Seated;AROM;Strengthening;Both;10 reps Toe Raises: Seated;AROM;Strengthening;Both;10 reps Heel Raises: Seated;AROM;Strengthening;Both;10 reps    General Comments        Pertinent Vitals/Pain Pain Assessment: No/denies pain    Home Living                      Prior Function            PT Goals (current goals can now be found in the care plan section) Acute Rehab PT Goals Patient Stated Goal: to get stronger to go home  PT Goal Formulation: With patient/family Potential to Achieve Goals: Good Progress towards PT goals: Progressing toward goals    Frequency  Min 3X/week      PT Plan Current plan remains appropriate    Co-evaluation              AM-PAC PT "6 Clicks" Daily Activity  Outcome Measure  Difficulty turning over in bed (including adjusting bedclothes, sheets and blankets)?: A Little Difficulty moving from lying on back to sitting on the side of the bed? : A Little Difficulty sitting down on and standing up from a chair with arms (e.g., wheelchair, bedside commode, etc,.)?: A Little Help needed moving to and from a bed to chair (including a wheelchair)?: A Lot Help needed walking in hospital room?: A Lot Help needed climbing 3-5 steps with a railing? : Total 6 Click  Score: 14    End of Session   Activity Tolerance: Patient limited by fatigue;Patient tolerated treatment well Patient left: in chair;with call bell/phone within reach Nurse Communication: Mobility status PT Visit Diagnosis: Unsteadiness on feet (R26.81);Other abnormalities of gait and mobility (R26.89);Muscle weakness (generalized) (M62.81)     Time: 4270-6237 PT Time Calculation (min) (ACUTE ONLY): 22 min  Charges:  $Therapeutic Exercise: 8-22 mins                    G Codes:       11:56 AM, 09-09-2017 Lonell Grandchild, MPT Physical Therapist with [facility] [phone] office [phone] mobile phone

## 2017-08-11 NOTE — NC FL2 (Signed)
[place] LEVEL OF CARE SCREENING TOOL     IDENTIFICATION  Patient Name: Kathleen Vance Birthdate: January 24, 1951 Sex: female Admission Date (Current Location): 08/05/2017  [facility] and [place] Number:  [employer] and Address:  [place] [address], [place]      Provider Number: [id number]  Attending Physician Name and Address:  Tawni Millers  Relative Name and Phone Number:       Current Level of Care: Hospital Recommended Level of Care: [place] Prior Approval Number:    Date Approved/Denied:   PASRR Number: [id number])  Discharge Plan: SNF    Current Diagnoses: Patient Active Problem List   Diagnosis Date Noted  . Acidosis   . Acute on chronic respiratory failure with hypercapnia ([place])   . HCAP (healthcare-associated pneumonia) 08/05/2017  . Liver lesion 08/05/2017  . Acute renal failure superimposed on stage 2 chronic kidney disease ([place]) 08/05/2017  . Gastroesophageal reflux disease   . Class 2 obesity due to excess calories with body mass index (BMI) of 38.0 to 38.9 in adult   . Chest pain 07/22/2017  . Hypertension 07/22/2017  . Hypothyroidism 07/22/2017  . Hyperlipidemia 07/22/2017  . Gout 07/22/2017  . Prediabetes 07/22/2017  . Hypomagnesemia 07/22/2017  . Acute lower UTI 07/22/2017    Orientation RESPIRATION BLADDER Height & Weight     Self, Time, Place, Situation  O2(2L) Incontinent Weight: 228 lb 13.4 oz (103.8 kg) Height:  5\' 4"  (162.6 cm)  BEHAVIORAL SYMPTOMS/MOOD NEUROLOGICAL BOWEL NUTRITION STATUS      Incontinent Diet(see discharge summary )  AMBULATORY STATUS COMMUNICATION OF NEEDS Skin   Extensive Assist Verbally Normal                       Personal Care Assistance Level of Assistance  Bathing, Feeding, Dressing Bathing Assistance: Limited assistance Feeding assistance: Independent Dressing Assistance: Limited assistance      Functional Limitations Info  Speech, Sight, Hearing Sight Info: Adequate Hearing Info: Adequate Speech Info: Adequate    SPECIAL CARE FACTORS FREQUENCY  PT (By licensed PT)     PT Frequency: 5x/week               Contractures Contractures Info: Not present    Additional Factors Info  Code Status, Allergies Code Status Info: Full code Allergies Info: Bactrim, Ciprofloxacin, Nitrofurantoin, Metronidazole, Axomicillin, Ethromycin            Current Medications (08/11/2017):  This is the current hospital active medication list Current Facility-Administered Medications  Medication Dose Route Frequency Provider Last Rate Last Dose  . acetaminophen (TYLENOL) tablet 650 mg  650 mg Oral Q6H PRN Barton Dubois, MD       Or  . acetaminophen (TYLENOL) suppository 650 mg  650 mg Rectal Q6H PRN Barton Dubois, MD      . amoxicillin-clavulanate (AUGMENTIN) 500-125 MG per tablet 500 mg  1 tablet Oral Q12H Barton Dubois, MD   500 mg at 08/11/17 1055  . aspirin chewable tablet 81 mg  81 mg Oral Daily Barton Dubois, MD   81 mg at 08/11/17 1056  . chlorhexidine (PERIDEX) 0.12 % solution 15 mL  15 mL Mouth Rinse BID Barton Dubois, MD   15 mL at 08/11/17 1055  . diphenhydrAMINE (BENADRYL) capsule 25 mg  25 mg Oral Q6H PRN Barton Dubois, MD   25 mg at 08/10/17 2118  . famotidine (PEPCID) tablet 20 mg  20 mg Oral  QHS Arrien, Jimmy Picket, MD   20 mg at 08/10/17 2118  . fenofibrate tablet 160 mg  160 mg Oral Daily Barton Dubois, MD   160 mg at 08/11/17 1055  . furosemide (LASIX) injection 40 mg  40 mg Intravenous BID Fran Lowes, MD   40 mg at 08/11/17 1055  . gabapentin (NEURONTIN) capsule 300 mg  300 mg Oral BID Barton Dubois, MD   300 mg at 08/11/17 1055  . heparin injection 5,000 Units  5,000 Units Subcutaneous Q8H Barton Dubois, MD   5,000 Units at 08/11/17 0530  . hydrALAZINE (APRESOLINE) tablet 10 mg  10 mg Oral Q8H Barton Dubois, MD   10 mg at 08/11/17 0529  . insulin  aspart (novoLOG) injection 0-5 Units  0-5 Units Subcutaneous QHS Barton Dubois, MD      . insulin aspart (novoLOG) injection 0-9 Units  0-9 Units Subcutaneous TID WC Barton Dubois, MD   1 Units at 08/11/17 1217  . labetalol (NORMODYNE) tablet 200 mg  200 mg Oral BID Barton Dubois, MD   200 mg at 08/11/17 1058  . levothyroxine (SYNTHROID, LEVOTHROID) tablet 100 mcg  100 mcg Oral QAC breakfast Barton Dubois, MD   100 mcg at 08/11/17 0530  . MEDLINE mouth rinse  15 mL Mouth Rinse q12n4p Barton Dubois, MD   15 mL at 08/11/17 1218  . ondansetron (ZOFRAN) injection 4 mg  4 mg Intravenous Q6H PRN Barton Dubois, MD   4 mg at 08/05/17 1711  . oxyCODONE-acetaminophen (PERCOCET/ROXICET) 5-325 MG per tablet 1 tablet  1 tablet Oral Q8H PRN Barton Dubois, MD   1 tablet at 08/11/17 1055  . potassium chloride (K-DUR,KLOR-CON) CR tablet 30 mEq  30 mEq Oral BID Fran Lowes, MD   30 mEq at 08/11/17 1055  . pravastatin (PRAVACHOL) tablet 20 mg  20 mg Oral Daily Barton Dubois, MD   20 mg at 08/11/17 1055     Discharge Medications: Please see discharge summary for a list of discharge medications.  Relevant Imaging Results:  Relevant Lab Results:   Additional Information SSN [id number] Clydene Pugh, LCSW

## 2017-08-11 NOTE — Clinical Social Work Note (Signed)
Clinical Social Work Assessment  Patient Details  Name: Kathleen Vance MRN: [id number] Date of Birth: 1950-06-25  Date of referral:  08/11/17               Reason for consult:  Facility Placement                Permission sought to share information with:    Permission granted to share information::     Name::        Agency::     Relationship::     Contact Information:  Mr. Scogin  Housing/Transportation Living arrangements for the past 2 months:  [place] of Information:  Patient, Spouse Patient Interpreter Needed:  None Criminal Activity/Legal Involvement Pertinent to Current Situation/Hospitalization:  No - Comment as needed Significant Relationships:  Adult Children, Spouse Lives with:  Spouse Do you feel safe going back to the place where you live?  Yes Need for family participation in patient care:  No (Coment)  Care giving concerns:  No care giving concerns identified.    Social Worker assessment / plan:  Patient is independent at baseline. She ambulates with a cane and drives for herself.  Patient is agreeable to STR at SNF.    Employment status:  Retired Nurse, adult PT Recommendations:  [place] / Referral to community resources:  [place]  Patient/Family's Response to care:  Patient is agreeable to STR at [facility].   Patient/Family's Understanding of and Emotional Response to Diagnosis, Current Treatment, and Prognosis:  Patient and family understand her diagnosis, treatment and prognosis.   Emotional Assessment Appearance:  Appears stated age Attitude/Demeanor/Rapport:    Affect (typically observed):  Accepting, Calm Orientation:  Oriented to Self, Oriented to Place, Oriented to  Time, Oriented to Situation Alcohol / Substance use:  Not Applicable Psych involvement (Current and /or in the community):     Discharge Needs  Concerns to be addressed:  Discharge  Planning Concerns Readmission within the last 30 days:  No Current discharge risk:  None Barriers to Discharge:  No Barriers Identified   Ihor Gully, LCSW 08/11/2017, 3:03 PM

## 2017-08-11 NOTE — Progress Notes (Signed)
0820 Patient assisted OOB up to chair via 1 person assist. Patient still noted weak but improved since yesterday. New order noted to d/c BiPAP. Patient placed on O2 [place] @ 3L (prior was @ 4L) with O2 SAT @ 100%, will continue to monitor & titrate O2 accordingly. Patient's baseline prior to admission was RA no O2 supplementation. Patient sitting in chair at this time eating brkfst, feeding self. Vss.

## 2017-08-11 NOTE — Progress Notes (Signed)
1028 O2 via [place] titrated to 2L, O2 SAT remains 100% at this time. Will continue to monitor.

## 2017-08-12 DIAGNOSIS — N183 Chronic kidney disease, stage 3 (moderate): Secondary | ICD-10-CM

## 2017-08-12 DIAGNOSIS — E6609 Other obesity due to excess calories: Secondary | ICD-10-CM

## 2017-08-12 DIAGNOSIS — E038 Other specified hypothyroidism: Secondary | ICD-10-CM

## 2017-08-12 DIAGNOSIS — Z6838 Body mass index (BMI) 38.0-38.9, adult: Secondary | ICD-10-CM

## 2017-08-12 DIAGNOSIS — J9622 Acute and chronic respiratory failure with hypercapnia: Secondary | ICD-10-CM

## 2017-08-12 DIAGNOSIS — J189 Pneumonia, unspecified organism: Secondary | ICD-10-CM

## 2017-08-12 DIAGNOSIS — J181 Lobar pneumonia, unspecified organism: Secondary | ICD-10-CM

## 2017-08-12 DIAGNOSIS — I10 Essential (primary) hypertension: Secondary | ICD-10-CM

## 2017-08-12 DIAGNOSIS — N179 Acute kidney failure, unspecified: Secondary | ICD-10-CM

## 2017-08-12 LAB — RENAL FUNCTION PANEL
ANION GAP: 12 (ref 5–15)
Albumin: 2.6 g/dL — ABNORMAL LOW (ref 3.5–5.0)
BUN: 38 mg/dL — ABNORMAL HIGH (ref 6–20)
CHLORIDE: 104 mmol/L (ref 101–111)
CO2: 29 mmol/L (ref 22–32)
Calcium: 8.2 mg/dL — ABNORMAL LOW (ref 8.9–10.3)
Creatinine, Ser: 2.64 mg/dL — ABNORMAL HIGH (ref 0.44–1.00)
GFR, EST AFRICAN AMERICAN: 20 mL/min — AB (ref >60)
GFR, EST NON AFRICAN AMERICAN: 18 mL/min — AB (ref >60)
Glucose, Bld: 138 mg/dL — ABNORMAL HIGH (ref 65–99)
PHOSPHORUS: 5.1 mg/dL — AB (ref 2.5–4.6)
POTASSIUM: 3.9 mmol/L (ref 3.5–5.1)
Sodium: 145 mmol/L (ref 135–145)

## 2017-08-12 LAB — GLUCOSE, CAPILLARY
GLUCOSE-CAPILLARY: 109 mg/dL — AB (ref 65–99)
GLUCOSE-CAPILLARY: 130 mg/dL — AB (ref 65–99)
GLUCOSE-CAPILLARY: 139 mg/dL — AB (ref 65–99)
Glucose-Capillary: 165 mg/dL — ABNORMAL HIGH (ref 65–99)

## 2017-08-12 NOTE — Progress Notes (Addendum)
PROGRESS NOTE  Kathleen Vance IWP:[id number] DOB: 1950-12-09 DOA: 08/05/2017 PCP: Celene Squibb, MD  Brief History:  67 year old female who presented with dyspnea, fever and malaise. She does have significant past medical history for dyslipidemia, hypertension, type 2 diabetes mellitus, hypothyroidism, and chronic kidney disease stage II. Patient reported 2-3 days of worsening dyspnea, associated with chills and fever, associated with lower extremity edema. On the initial physical examination her blood pressure 171/93, heart rate 87, respiratory rate 14, oxygen saturation 95%, temperature 98.7. She had moist mucous membranes, lungs were clear to auscultation bilaterally, heart S1-S2 present and rhythmic, abdomen protuberant, 1+ lower extremity edema. Sodium 141, potassium 4.4, chloride 105, bicarbonate 24, glucose 114, BUN 30, creatinine 1.98, white count 17.4, hemoglobin 9.5, hematocrit 32.8, platelets 599, urinalysis pH 5.0, 100 protein and specific gravity 1.017, red cells 0-5, white cells 6-30. Chest x-ray with right rotation, good penetration, opacity at the left base. EKG normal sinus rhythm, normal axis and normal intervals. Patient was admitted to the hospital with the working diagnosis of left lower lobe pneumonia, complicated by acute on chronic renal failure. Renal was consulted to assist with management   Assessment/Plan: Acute hypercapnic and hypoxic respiratory failure -secondary to pneumonia -initially on BiPAP -now stable on 2L [place] -initially on vanco and aztreonam -had one dose zosyn on 4/3 -finish 8 days of amox/clav today  Acute on chronic renal failure--CKD stage 3 -baseline creatinine 1.7-1.9 -serum creatinine peaked 3.42 -due to sepsis and hemodynamic changes in setting of Left renal artery stenosis  Diabetes Mellitus type 2, controlled -07/22/17 A1C--6.8 -continue ISS -diet controlled prior to hospitalization  Hypothyroidism -continue synthroid  Mixed  Hyperlipidemia -continue statin and fenofibrate  Essential Hypertension -continue reduced dose labetalol -continue reduced dose hydralazine  Aortic Stenosis -followed by cardiology--Dr. Bronson Ing  Chronic diastolic CHF -lasix per renal -appears euvolemic clinically  Morbid Obesity -BMI 38.94 -lifestyle modification    Disposition Plan:   SNF on  4/11 or 4/12 when cleared by nephrology  Family Communication:   No Family at bedside  Consultants:  renal  Code Status:  FULL DVT Prophylaxis:  Novelty Heparin   Procedures: As Listed in Progress Note Above  Antibiotics: None    Subjective: Patient states that she feels weak in general.  She states her breathing is getting a bit better each day.  She denies any headache, fevers, chills, chest pain, nausea, vomiting, diarrhea, abdominal pain.  There is no dysuria hematuria.  Objective: Vitals:   08/12/17 0700 08/12/17 0800 08/12/17 0900 08/12/17 1006  BP: 126/83 123/73 114/71 114/71  Pulse: 66 69 69   Resp: 12 14 10    Temp:  99 F (37.2 C)    TempSrc:  Oral    SpO2: 98% 98% 100%   Weight:      Height:        Intake/Output Summary (Last 24 hours) at 08/12/2017 1111 Last data filed at 08/12/2017 0500 Gross per 24 hour  Intake -  Output 1900 ml  Net -1900 ml   Weight change: -0.9 kg (-1 lb 15.8 oz) Exam:   General:  Pt is alert, follows commands appropriately, not in acute distress  HEENT: No icterus, No thrush, No neck mass, [place]/AT  Cardiovascular: RRR, S1/S2, no rubs, no gallops  Respiratory: Diminished breath sounds at the bases.  Bibasilar crackles, left greater than right.  No wheezing.  Abdomen: Soft/+BS, non tender, non distended, no guarding  Extremities: No edema, No  lymphangitis, No petechiae, No rashes, no synovitis   Data Reviewed: I have personally reviewed following labs and imaging studies Basic Metabolic Panel: Recent Labs  Lab 08/05/17 1712  08/08/17 0638 08/09/17 0600 08/10/17 0442  08/11/17 0407 08/12/17 0428  NA  --    < > 137  138 141 144 144  143 145  K  --    < > 5.9*  5.9* 4.2 3.9 3.4*  3.4* 3.9  CL  --    < > 102  103 106 107 104  104 104  CO2  --    < > 22  22 22 26 27  28 29   GLUCOSE  --    < > 138*  138* 101* 131* 114*  115* 138*  BUN  --    < > 44*  44* 44* 40* 37*  38* 38*  CREATININE  --    < > 3.95*  3.97* 3.95* 3.42* 2.80*  2.91* 2.64*  CALCIUM  --    < > 7.8*  7.9* 7.2*  7.5* 7.3* 7.6*  7.5* 8.2*  MG 1.8  --   --   --   --   --   --   PHOS 3.9  --  8.5* 7.4*  --  5.5* 5.1*   < > = values in this interval not displayed.   Liver Function Tests: Recent Labs  Lab 08/08/17 2297 08/09/17 0600 08/10/17 0442 08/11/17 0407 08/12/17 0428  AST  --   --  14*  --   --   ALT  --   --  14  --   --   ALKPHOS  --   --  32*  --   --   BILITOT  --   --  0.7  --   --   PROT  --   --  6.3*  --   --   ALBUMIN 2.7* 2.5* 2.4* 2.5* 2.6*   No results for input(s): LIPASE, AMYLASE in the last 168 hours. No results for input(s): AMMONIA in the last 168 hours. Coagulation Profile: No results for input(s): INR, PROTIME in the last 168 hours. CBC: Recent Labs  Lab 08/06/17 0701 08/09/17 0600  WBC 13.6* 7.7  HGB 8.5* 8.4*  HCT 29.0* 29.1*  MCV 92.1 93.0  PLT 498* 495*   Cardiac Enzymes: No results for input(s): CKTOTAL, CKMB, CKMBINDEX, TROPONINI in the last 168 hours. BNP: Invalid input(s): POCBNP CBG: Recent Labs  Lab 08/11/17 0721 08/11/17 1134 08/11/17 1632 08/11/17 2126 08/12/17 0749  GLUCAP 100* 132* 89 156* 109*   HbA1C: No results for input(s): HGBA1C in the last 72 hours. Urine analysis:    Component Value Date/Time   COLORURINE YELLOW 08/05/2017 1104   APPEARANCEUR CLEAR 08/05/2017 1104   LABSPEC 1.017 08/05/2017 1104   PHURINE 5.0 08/05/2017 1104   GLUCOSEU NEGATIVE 08/05/2017 1104   HGBUR SMALL (A) 08/05/2017 1104   BILIRUBINUR NEGATIVE 08/05/2017 1104   KETONESUR NEGATIVE 08/05/2017 1104   PROTEINUR 100 (A)  08/05/2017 1104   NITRITE NEGATIVE 08/05/2017 1104   LEUKOCYTESUR TRACE (A) 08/05/2017 1104   Sepsis Labs: @LABRCNTIP (procalcitonin:4,lacticidven:4) ) Recent Results (from the past 240 hour(s))  MRSA PCR Screening     Status: None   Collection Time: 08/08/17  6:12 PM  Result Value Ref Range Status   MRSA by PCR NEGATIVE NEGATIVE Final    Comment:        The GeneXpert MRSA Assay (FDA approved for NASAL specimens only), is one  component of a comprehensive MRSA colonization surveillance program. It is not intended to diagnose MRSA infection nor to guide or monitor treatment for MRSA infections. Performed at [facility], [address], [place], [place] [address]      Scheduled Meds: . amoxicillin-clavulanate  1 tablet Oral Q12H  . aspirin  81 mg Oral Daily  . chlorhexidine  15 mL Mouth Rinse BID  . famotidine  20 mg Oral QHS  . fenofibrate  160 mg Oral Daily  . furosemide  40 mg Intravenous BID  . gabapentin  300 mg Oral BID  . heparin  5,000 Units Subcutaneous Q8H  . hydrALAZINE  10 mg Oral Q8H  . insulin aspart  0-5 Units Subcutaneous QHS  . insulin aspart  0-9 Units Subcutaneous TID WC  . labetalol  200 mg Oral BID  . levothyroxine  100 mcg Oral QAC breakfast  . mouth rinse  15 mL Mouth Rinse q12n4p  . potassium chloride  30 mEq Oral BID  . pravastatin  20 mg Oral Daily   Continuous Infusions:  Procedures/Studies: Dg Chest 2 View  Result Date: 08/05/2017 CLINICAL DATA:  Shortness of breath and right chest pain for 2 weeks. EXAM: CHEST - 2 VIEW COMPARISON:  CT chest and PA and lateral chest 07/21/2016. FINDINGS: The patient has a new moderate left pleural effusion with basilar airspace disease. Right lung is clear. No pneumothorax. There is cardiomegaly. Atherosclerosis noted. No acute bony abnormality. IMPRESSION: Moderate left pleural effusion and basilar airspace disease are new since the most recent examination. Airspace disease could be due to atelectasis or  infection. Cardiomegaly without edema. Electronically Signed   By: Inge Rise M.D.   On: 08/05/2017 11:49   Dg Chest 2 View  Result Date: 07/21/2017 CLINICAL DATA:  Chest pain radiating to the back and left arm since lunch. EXAM: CHEST - 2 VIEW COMPARISON:  03/26/2009 FINDINGS: Stable cardiomegaly with minimal aortic atherosclerosis. No effusion nor overt pulmonary edema. There is bibasilar atelectasis. No acute osseous abnormality. IMPRESSION: Stable cardiomegaly with aortic atherosclerosis. Bibasilar atelectasis is noted. No overt pulmonary edema, effusion or pneumothorax. Electronically Signed   By: Ashley Royalty M.D.   On: 07/21/2017 19:38   Ct Angio Chest Pe W/cm &/or Wo Cm  Result Date: 07/21/2017 CLINICAL DATA:  Chest pain with elevated D-dimer EXAM: CT ANGIOGRAPHY CHEST WITH CONTRAST TECHNIQUE: Multidetector CT imaging of the chest was performed using the standard protocol during bolus administration of intravenous contrast. Multiplanar CT image reconstructions and MIPs were obtained to evaluate the vascular anatomy. CONTRAST:  61mL ISOVUE-370 IOPAMIDOL (ISOVUE-370) INJECTION 76% COMPARISON:  Chest x-ray 07/21/2017, CT chest 08/11/2004 FINDINGS: Cardiovascular: Satisfactory opacification of the pulmonary arteries to the segmental level. No evidence of pulmonary embolism. Nonaneurysmal aorta. No dissection. Common origin of the left common carotid and right brachiocephalic artery from the arch. Mild cardiomegaly. Small pericardial effusion. Mediastinum/Nodes: Midline trachea. No thyroid mass. Mildly prominent mediastinal lymph nodes, lymph node adjacent to the aortic arch measures 1 cm. Left low paratracheal lymph node measures 1 cm. Subcarinal lymph node measures 15 mm. Esophagus within normal limits Lungs/Pleura: Lungs are clear. No pleural effusion or pneumothorax. Upper Abdomen: Vague 11 mm hypodensity in the posterior right hepatic lobe. No acute abnormality Musculoskeletal: Degenerative  changes of the spine. No acute or suspicious bone lesion. Review of the MIP images confirms the above findings. IMPRESSION: 1. Negative for acute pulmonary embolus or aortic dissection 2. Clear lung fields 3. Mild nonspecific mediastinal adenopathy, no significant change compared  to prior chest CT. 4. 11 mm hypodense lesion posterior right hepatic lobe. When the patient is clinically stable and able to follow directions and hold their breath (preferably as an outpatient) further evaluation with dedicated abdominal MRI should be considered. Electronically Signed   By: Donavan Foil M.D.   On: 07/21/2017 22:50   US Renal  Result Date: 08/07/2017 CLINICAL DATA:  Acute on chronic renal failure. EXAM: RENAL / URINARY TRACT ULTRASOUND COMPLETE COMPARISON:  None. FINDINGS: Right Kidney: Length: 9.9 cm. 1.3 cm simple cyst is seen in lower pole. Increased echogenicity of renal parenchyma is noted. No mass or hydronephrosis visualized. Left Kidney: Length: 9.9 cm. 6 mm nonobstructive calculus is noted in midpole. Increased echogenicity of renal parenchyma is noted. No mass or hydronephrosis visualized. Bladder: Not visualized due to body habitus. IMPRESSION: Increased echogenicity of renal parenchyma is noted bilaterally consistent with medical renal disease. Nonobstructive left renal calculus. No hydronephrosis or renal obstruction is noted. Electronically Signed   By: Marijo Conception, M.D.   On: 08/07/2017 11:01    Orson Eva, DO  [facility] Pager [phone]  If 7PM-7AM, please contact night-coverage [email or web address] Password [email or web address] 08/12/2017, 11:11 AM   LOS: 7 days

## 2017-08-12 NOTE — Progress Notes (Signed)
VIRGILIA QUIGG  MRN: [id number]  DOB/AGE: 68-02-1951 68 y.o.  Primary Care Physician:Hall, Edwinna Areola, MD  Admit date: 08/05/2017  Chief Complaint:  Chief Complaint  Patient presents with  . Shortness of Breath    S-Pt presented on  08/05/2017 with  Chief Complaint  Patient presents with  . Shortness of Breath  .    Pt  says  " Today I feel better"   Meds . amoxicillin-clavulanate  1 tablet Oral Q12H  . aspirin  81 mg Oral Daily  . chlorhexidine  15 mL Mouth Rinse BID  . famotidine  20 mg Oral QHS  . fenofibrate  160 mg Oral Daily  . furosemide  40 mg Intravenous BID  . gabapentin  300 mg Oral BID  . heparin  5,000 Units Subcutaneous Q8H  . hydrALAZINE  10 mg Oral Q8H  . insulin aspart  0-5 Units Subcutaneous QHS  . insulin aspart  0-9 Units Subcutaneous TID WC  . labetalol  200 mg Oral BID  . levothyroxine  100 mcg Oral QAC breakfast  . mouth rinse  15 mL Mouth Rinse q12n4p  . potassium chloride  30 mEq Oral BID  . pravastatin  20 mg Oral Daily          Physical Exam: Vital signs in last 24 hours: Temp:  [98.3 F (36.8 C)-100.2 F (37.9 C)] 98.9 F (37.2 C) (04/10 0400) Pulse Rate:  [64-84] 76 (04/10 0300) Resp:  [10-20] 10 (04/10 0300) BP: (128-149)/(70-87) 131/82 (04/10 0504) SpO2:  [83 %-100 %] 98 % (04/10 0300) FiO2 (%):  [28 %] 28 % (04/09 2100) Weight change:  Last BM Date: 08/10/17  Intake/Output from previous day: 04/09 0701 - 04/10 0700 In: -  Out: 1000 [Urine:1000] Total I/O In: -  Out: 1000 [Urine:1000]   Physical Exam: General- pt is awake,alert, oriented to time place and person Resp- No acute REsp distress, Rhonchi+ CVS- S1S2 regular ij rate and rhythm GIT- BS+, soft, NT, ND EXT- NO LE Edema, Cyanosis   Lab Results: CBC Recent Labs    08/09/17 0600  WBC 7.7  HGB 8.4*  HCT 29.1*  PLT 495*    BMET Recent Labs    08/11/17 0407 08/12/17 0428  NA 144  143 145  K 3.4*  3.4* 3.9  CL 104  104 104  CO2 27  28 29    GLUCOSE 114*  115* 138*  BUN 37*  38* 38*  CREATININE 2.80*  2.91* 2.64*  CALCIUM 7.6*  7.5* 8.2*   Creat trend 2019  1.7( Baseline)=> 3.9==> 2.6   MICRO Recent Results (from the past 240 hour(s))  MRSA PCR Screening     Status: None   Collection Time: 08/08/17  6:12 PM  Result Value Ref Range Status   MRSA by PCR NEGATIVE NEGATIVE Final    Comment:        The GeneXpert MRSA Assay (FDA approved for NASAL specimens only), is one component of a comprehensive MRSA colonization surveillance program. It is not intended to diagnose MRSA infection nor to guide or monitor treatment for MRSA infections. Performed at [facility], [address], [place],  [address]       Lab Results  Component Value Date   PTH 127 (H) 08/09/2017   PTH Comment 08/09/2017   CALCIUM 8.2 (L) 08/12/2017   PHOS 5.1 (H) 08/12/2017               Impression: 1)Renal  AKI secondary to Prerenal/ATN  AKI sec to Vancomycin               AKI on CKD               CKD stage 3.               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 On Vasodilators.  3)Anemia HGb low Primary team following  4)CKD Mineral-Bone Disorder PTH elevated. Secondary Hyperparathyroidismpresent.. Phosphorus not at goal.   5)ID-admitted with Pneumonia Primary MD following  6)Electrolytes  Hypokalemic   Now better  NOrmonatremic   7)Acid base Co2 at goal     Plan:  Will continue current care.     [place] S 08/12/2017, 5:49 AM

## 2017-08-13 LAB — BASIC METABOLIC PANEL
ANION GAP: 12 (ref 5–15)
BUN: 38 mg/dL — ABNORMAL HIGH (ref 6–20)
CALCIUM: 8.6 mg/dL — AB (ref 8.9–10.3)
CHLORIDE: 102 mmol/L (ref 101–111)
CO2: 31 mmol/L (ref 22–32)
CREATININE: 2.72 mg/dL — AB (ref 0.44–1.00)
GFR calc Af Amer: 20 mL/min — ABNORMAL LOW (ref >60)
GFR calc non Af Amer: 17 mL/min — ABNORMAL LOW (ref >60)
Glucose, Bld: 179 mg/dL — ABNORMAL HIGH (ref 65–99)
Potassium: 4 mmol/L (ref 3.5–5.1)
SODIUM: 145 mmol/L (ref 135–145)

## 2017-08-13 LAB — GLUCOSE, CAPILLARY
GLUCOSE-CAPILLARY: 132 mg/dL — AB (ref 65–99)
GLUCOSE-CAPILLARY: 114 mg/dL — AB (ref 65–99)
GLUCOSE-CAPILLARY: 133 mg/dL — AB (ref 65–99)
Glucose-Capillary: 110 mg/dL — ABNORMAL HIGH (ref 65–99)

## 2017-08-13 MED ORDER — OXYCODONE-ACETAMINOPHEN 5-325 MG PO TABS
1.0000 | ORAL_TABLET | Freq: Once | ORAL | Status: AC
  Administered 2017-08-13: 1 via ORAL

## 2017-08-13 NOTE — Care Management Note (Signed)
Case Management Note  Patient Details  Name: Kathleen Vance MRN: [id number] Date of Birth: 1951/03/22  If discussed at Long Length of Stay Meetings, dates discussed:   08/13/2017 Additional Comments:  [place], Chauncey [place], RN 08/13/2017, 12:04 PM

## 2017-08-13 NOTE — Progress Notes (Signed)
Pts O2 sats 85%-placed on 2L O2 per [place], increased to 93 %. Will continue to monitor pt

## 2017-08-13 NOTE — Progress Notes (Signed)
PT Cancellation Note  Patient Details Name: Kathleen Vance MRN: [id number] DOB: 07-09-1950   Cancelled Treatment:    Reason Eval/Treat Not Completed: Fatigue/lethargy limiting ability to participate.  Patient declined therapy secondary to c/o fatigue after having been up in chair most of morning.  Will check back tomorrow.   3:44 PM, 08/13/17 Lonell Grandchild, MPT Physical Therapist with [facility] [phone] office [phone] mobile phone

## 2017-08-13 NOTE — Progress Notes (Signed)
PROGRESS NOTE  Kathleen Vance ZLD:[id number] DOB: 1950/09/16 DOA: 08/05/2017 PCP: Celene Squibb, MD  Brief History:  67 year old female who presented with dyspnea, fever and malaise. She does have significant past medical history for dyslipidemia, hypertension, type 2 diabetes mellitus, hypothyroidism, and chronic kidney disease stage II. Patient reported 2-3 days of worsening dyspnea, associated with chills and fever, associated with lower extremity edema. On the initial physical examination her blood pressure 171/93, heart rate 87, respiratory rate 14, oxygen saturation 95%, temperature 98.7. She had moist mucous membranes, lungs were clear to auscultation bilaterally, heart S1-S2 present and rhythmic, abdomen protuberant, 1+ lower extremity edema. Sodium 141, potassium 4.4, chloride 105, bicarbonate 24, glucose 114, BUN 30, creatinine 1.98, white count 17.4, hemoglobin 9.5, hematocrit 32.8, platelets 599, urinalysis pH 5.0, 100 protein and specific gravity 1.017, red cells 0-5, white cells 6-30. Chest x-ray with right rotation, good penetration, opacity at the left base. EKG normal sinus rhythm, normal axis and normal intervals. Patient was admitted to the hospital with the working diagnosis of left lower lobe pneumonia, complicated by acute on chronic renal failure. Renal was consulted to assist with management   Assessment/Plan: Acute hypercapnic and hypoxic respiratory failure -secondary to pneumonia -initially on BiPAP -now stable on 2L [place]>>weaned to room air -initially on vanco and aztreonam -had one dose zosyn on 4/3 -finish 8 days of amox/clav 4/11  Pneumonia -finished 8 days of abx on 08/13/17 -stable on RA now  Acute on chronic renal failure--CKD stage 3 -baseline creatinine 1.7-1.9 -serum creatinine peaked 3.42 -due to sepsis and hemodynamic changes in setting of Left renal artery stenosis -suspect pt may have new renal function baseline  Diabetes Mellitus type  2, controlled -07/22/17 A1C--6.8 -continue ISS -diet controlled prior to hospitalization  Hypothyroidism -continue synthroid  Mixed Hyperlipidemia -continue statin and fenofibrate  Essential Hypertension -continue reduced dose labetalol -continue reduced dose hydralazine  Aortic Stenosis -followed by cardiology--Dr. Bronson Ing  Chronic diastolic CHF -lasix per renal -appears euvolemic clinically -pt was on lasix 40 daily prior to admission  Morbid Obesity -BMI 38.94 -lifestyle modification    Disposition Plan:   SNF on  4/12 if renal function is stable  Family Communication:   son updated at bedside  Consultants:  renal  Code Status:  FULL DVT Prophylaxis:  [place] Heparin   Procedures: As Listed in Progress Note Above          Subjective: Patient overall is feeling better.  She still feels weak generally.  She denies any chest pain, shortness breath, nausea, vomiting, diarrhea, abdominal pain.  She does have some right rib pain with movement and palpation.  No hemoptysis.  Objective: Vitals:   08/13/17 0822 08/13/17 0908 08/13/17 1231 08/13/17 1509  BP:  137/70 129/78   Pulse: 82 70 67   Resp: 18  18   Temp:   98.3 F (36.8 C) 98.7 F (37.1 C)  TempSrc:   Oral Oral  SpO2: 92%  97%   Weight:      Height:        Intake/Output Summary (Last 24 hours) at 08/13/2017 1808 Last data filed at 08/13/2017 1700 Gross per 24 hour  Intake 720 ml  Output 600 ml  Net 120 ml   Weight change:  Exam:   General:  Pt is alert, follows commands appropriately, not in acute distress  HEENT: No icterus, No thrush, No neck mass, [place]/AT  Cardiovascular: RRR, S1/S2, no rubs, no  gallops  Respiratory: Bibasilar crackles.  No wheezing.  Good air movement.  Abdomen: Soft/+BS, non tender, non distended, no guarding  Extremities: trace LE edema, No lymphangitis, No petechiae, No rashes, no synovitis   Data Reviewed: I have personally reviewed  following labs and imaging studies Basic Metabolic Panel: Recent Labs  Lab 08/08/17 0638 08/09/17 0600 08/10/17 0442 08/11/17 0407 08/12/17 0428 08/13/17 1040  NA 137  138 141 144 144  143 145 145  K 5.9*  5.9* 4.2 3.9 3.4*  3.4* 3.9 4.0  CL 102  103 106 107 104  104 104 102  CO2 22  22 22 26 27  28 29 31   GLUCOSE 138*  138* 101* 131* 114*  115* 138* 179*  BUN 44*  44* 44* 40* 37*  38* 38* 38*  CREATININE 3.95*  3.97* 3.95* 3.42* 2.80*  2.91* 2.64* 2.72*  CALCIUM 7.8*  7.9* 7.2*  7.5* 7.3* 7.6*  7.5* 8.2* 8.6*  PHOS 8.5* 7.4*  --  5.5* 5.1*  --    Liver Function Tests: Recent Labs  Lab 08/08/17 0638 08/09/17 0600 08/10/17 0442 08/11/17 0407 08/12/17 0428  AST  --   --  14*  --   --   ALT  --   --  14  --   --   ALKPHOS  --   --  32*  --   --   BILITOT  --   --  0.7  --   --   PROT  --   --  6.3*  --   --   ALBUMIN 2.7* 2.5* 2.4* 2.5* 2.6*   No results for input(s): LIPASE, AMYLASE in the last 168 hours. No results for input(s): AMMONIA in the last 168 hours. Coagulation Profile: No results for input(s): INR, PROTIME in the last 168 hours. CBC: Recent Labs  Lab 08/09/17 0600  WBC 7.7  HGB 8.4*  HCT 29.1*  MCV 93.0  PLT 495*   Cardiac Enzymes: No results for input(s): CKTOTAL, CKMB, CKMBINDEX, TROPONINI in the last 168 hours. BNP: Invalid input(s): POCBNP CBG: Recent Labs  Lab 08/12/17 1619 08/12/17 2130 08/13/17 0735 08/13/17 1138 08/13/17 1618  GLUCAP 139* 165* 110* 132* 114*   HbA1C: No results for input(s): HGBA1C in the last 72 hours. Urine analysis:    Component Value Date/Time   COLORURINE YELLOW 08/05/2017 1104   APPEARANCEUR CLEAR 08/05/2017 1104   LABSPEC 1.017 08/05/2017 1104   PHURINE 5.0 08/05/2017 1104   GLUCOSEU NEGATIVE 08/05/2017 1104   HGBUR SMALL (A) 08/05/2017 1104   BILIRUBINUR NEGATIVE 08/05/2017 1104   KETONESUR NEGATIVE 08/05/2017 1104   PROTEINUR 100 (A) 08/05/2017 1104   NITRITE NEGATIVE 08/05/2017  1104   LEUKOCYTESUR TRACE (A) 08/05/2017 1104   Sepsis Labs: @LABRCNTIP (procalcitonin:4,lacticidven:4) ) Recent Results (from the past 240 hour(s))  MRSA PCR Screening     Status: None   Collection Time: 08/08/17  6:12 PM  Result Value Ref Range Status   MRSA by PCR NEGATIVE NEGATIVE Final    Comment:        The GeneXpert MRSA Assay (FDA approved for NASAL specimens only), is one component of a comprehensive MRSA colonization surveillance program. It is not intended to diagnose MRSA infection nor to guide or monitor treatment for MRSA infections. Performed at [facility], [address], [place], [place] [address]      Scheduled Meds: . aspirin  81 mg Oral Daily  . famotidine  20 mg Oral QHS  . fenofibrate  160 mg Oral Daily  . gabapentin  300 mg Oral BID  . heparin  5,000 Units Subcutaneous Q8H  . hydrALAZINE  10 mg Oral Q8H  . insulin aspart  0-5 Units Subcutaneous QHS  . insulin aspart  0-9 Units Subcutaneous TID WC  . labetalol  200 mg Oral BID  . levothyroxine  100 mcg Oral QAC breakfast  . pravastatin  20 mg Oral Daily   Continuous Infusions:  Procedures/Studies: Dg Chest 2 View  Result Date: 08/05/2017 CLINICAL DATA:  Shortness of breath and right chest pain for 2 weeks. EXAM: CHEST - 2 VIEW COMPARISON:  CT chest and PA and lateral chest 07/21/2016. FINDINGS: The patient has a new moderate left pleural effusion with basilar airspace disease. Right lung is clear. No pneumothorax. There is cardiomegaly. Atherosclerosis noted. No acute bony abnormality. IMPRESSION: Moderate left pleural effusion and basilar airspace disease are new since the most recent examination. Airspace disease could be due to atelectasis or infection. Cardiomegaly without edema. Electronically Signed   By: Inge Rise M.D.   On: 08/05/2017 11:49   Dg Chest 2 View  Result Date: 07/21/2017 CLINICAL DATA:  Chest pain radiating to the back and left arm since lunch. EXAM: CHEST - 2 VIEW  COMPARISON:  03/26/2009 FINDINGS: Stable cardiomegaly with minimal aortic atherosclerosis. No effusion nor overt pulmonary edema. There is bibasilar atelectasis. No acute osseous abnormality. IMPRESSION: Stable cardiomegaly with aortic atherosclerosis. Bibasilar atelectasis is noted. No overt pulmonary edema, effusion or pneumothorax. Electronically Signed   By: Ashley Royalty M.D.   On: 07/21/2017 19:38   Ct Angio Chest Pe W/cm &/or Wo Cm  Result Date: 07/21/2017 CLINICAL DATA:  Chest pain with elevated D-dimer EXAM: CT ANGIOGRAPHY CHEST WITH CONTRAST TECHNIQUE: Multidetector CT imaging of the chest was performed using the standard protocol during bolus administration of intravenous contrast. Multiplanar CT image reconstructions and MIPs were obtained to evaluate the vascular anatomy. CONTRAST:  32mL ISOVUE-370 IOPAMIDOL (ISOVUE-370) INJECTION 76% COMPARISON:  Chest x-ray 07/21/2017, CT chest 08/11/2004 FINDINGS: Cardiovascular: Satisfactory opacification of the pulmonary arteries to the segmental level. No evidence of pulmonary embolism. Nonaneurysmal aorta. No dissection. Common origin of the left common carotid and right brachiocephalic artery from the arch. Mild cardiomegaly. Small pericardial effusion. Mediastinum/Nodes: Midline trachea. No thyroid mass. Mildly prominent mediastinal lymph nodes, lymph node adjacent to the aortic arch measures 1 cm. Left low paratracheal lymph node measures 1 cm. Subcarinal lymph node measures 15 mm. Esophagus within normal limits Lungs/Pleura: Lungs are clear. No pleural effusion or pneumothorax. Upper Abdomen: Vague 11 mm hypodensity in the posterior right hepatic lobe. No acute abnormality Musculoskeletal: Degenerative changes of the spine. No acute or suspicious bone lesion. Review of the MIP images confirms the above findings. IMPRESSION: 1. Negative for acute pulmonary embolus or aortic dissection 2. Clear lung fields 3. Mild nonspecific mediastinal adenopathy, no  significant change compared to prior chest CT. 4. 11 mm hypodense lesion posterior right hepatic lobe. When the patient is clinically stable and able to follow directions and hold their breath (preferably as an outpatient) further evaluation with dedicated abdominal MRI should be considered. Electronically Signed   By: Donavan Foil M.D.   On: 07/21/2017 22:50   US Renal  Result Date: 08/07/2017 CLINICAL DATA:  Acute on chronic renal failure. EXAM: RENAL / URINARY TRACT ULTRASOUND COMPLETE COMPARISON:  None. FINDINGS: Right Kidney: Length: 9.9 cm. 1.3 cm simple cyst is seen in lower pole. Increased echogenicity of renal parenchyma is noted. No mass or  hydronephrosis visualized. Left Kidney: Length: 9.9 cm. 6 mm nonobstructive calculus is noted in midpole. Increased echogenicity of renal parenchyma is noted. No mass or hydronephrosis visualized. Bladder: Not visualized due to body habitus. IMPRESSION: Increased echogenicity of renal parenchyma is noted bilaterally consistent with medical renal disease. Nonobstructive left renal calculus. No hydronephrosis or renal obstruction is noted. Electronically Signed   By: Marijo Conception, M.D.   On: 08/07/2017 11:01    Orson Eva, DO  [facility] Pager [phone]  If 7PM-7AM, please contact night-coverage [email or web address] Password [email or web address] 08/13/2017, 6:08 PM   LOS: 8 days

## 2017-08-13 NOTE — Progress Notes (Signed)
Subjective: Interval History: Patient denies any cough or difficulty breathing.  Her appetite is getting better.  Objective: Vital signs in last 24 hours: Temp:  [98 F (36.7 C)-99.7 F (37.6 C)] 98.8 F (37.1 C) (04/11 0400) Pulse Rate:  [47-82] 82 (04/11 0822) Resp:  [9-20] 18 (04/11 0822) BP: (114-168)/(62-88) 139/81 (04/11 0800) SpO2:  [85 %-100 %] 92 % (04/11 0822) Weight change:   Intake/Output from previous day: 04/10 0701 - 04/11 0700 In: 480 [P.O.:480] Out: 1550 [Urine:1550] Intake/Output this shift: No intake/output data recorded.  General appearance: alert, cooperative and no distress Resp: rales bilaterally Cardio: regular rate and rhythm Extremities: No edema  Lab Results: No results for input(s): WBC, HGB, HCT, PLT in the last 72 hours. BMET:  Recent Labs    08/11/17 0407 08/12/17 0428  NA 144  143 145  K 3.4*  3.4* 3.9  CL 104  104 104  CO2 27  28 29   GLUCOSE 114*  115* 138*  BUN 37*  38* 38*  CREATININE 2.80*  2.91* 2.64*  CALCIUM 7.6*  7.5* 8.2*   No results for input(s): PTH in the last 72 hours. Iron Studies:  No results for input(s): IRON, TIBC, TRANSFERRIN, FERRITIN in the last 72 hours.  Studies/Results: No results found.  I have reviewed the patient's current medications.  Assessment/Plan: 1] acute kidney injury superimposed on chronic: Possibly ATN/prerenal/vancomycin associated acute kidney injury.  Her renal function continue to improve.  She is a symptomatic.. 2] hypokalemia: Potassium is.  Patient is on potassium supplement normal. 3] hypertension: Her blood pressure is reasonably controlled 4] chronic kidney disease: Patient with history of renal artery stenosis.  Ultrasound of the kidney showed increased bilateral echogenicity consistent with chronic kidney injury 5] bone and mineral disorder: Calcium and phosphorus is 0 range. 6] anemia: Her hemoglobin below  our target goal.  Her iron saturation is low and she might  have superimposed iron deficiency anemia. 7] hypothyroidism 8] acidosis: Has corrected. Plan: 1] we will continue his present management 2] will DC Lasix 3] DC potassium supplement 4] we will check  renal panel in the morning.    LOS: 8 days   Hydia Copelin S 08/13/2017,8:51 AM

## 2017-08-14 DIAGNOSIS — J9622 Acute and chronic respiratory failure with hypercapnia: Secondary | ICD-10-CM | POA: Diagnosis not present

## 2017-08-14 DIAGNOSIS — I350 Nonrheumatic aortic (valve) stenosis: Secondary | ICD-10-CM | POA: Diagnosis not present

## 2017-08-14 DIAGNOSIS — I5032 Chronic diastolic (congestive) heart failure: Secondary | ICD-10-CM | POA: Diagnosis not present

## 2017-08-14 DIAGNOSIS — E785 Hyperlipidemia, unspecified: Secondary | ICD-10-CM | POA: Diagnosis not present

## 2017-08-14 DIAGNOSIS — E1140 Type 2 diabetes mellitus with diabetic neuropathy, unspecified: Secondary | ICD-10-CM | POA: Diagnosis not present

## 2017-08-14 DIAGNOSIS — E039 Hypothyroidism, unspecified: Secondary | ICD-10-CM | POA: Diagnosis not present

## 2017-08-14 DIAGNOSIS — Z780 Asymptomatic menopausal state: Secondary | ICD-10-CM | POA: Diagnosis not present

## 2017-08-14 DIAGNOSIS — M6281 Muscle weakness (generalized): Secondary | ICD-10-CM | POA: Diagnosis not present

## 2017-08-14 DIAGNOSIS — D631 Anemia in chronic kidney disease: Secondary | ICD-10-CM | POA: Diagnosis not present

## 2017-08-14 DIAGNOSIS — I10 Essential (primary) hypertension: Secondary | ICD-10-CM | POA: Diagnosis not present

## 2017-08-14 DIAGNOSIS — R2689 Other abnormalities of gait and mobility: Secondary | ICD-10-CM | POA: Diagnosis not present

## 2017-08-14 DIAGNOSIS — E559 Vitamin D deficiency, unspecified: Secondary | ICD-10-CM | POA: Diagnosis not present

## 2017-08-14 DIAGNOSIS — R509 Fever, unspecified: Secondary | ICD-10-CM | POA: Diagnosis not present

## 2017-08-14 DIAGNOSIS — Z881 Allergy status to other antibiotic agents status: Secondary | ICD-10-CM | POA: Diagnosis not present

## 2017-08-14 DIAGNOSIS — I352 Nonrheumatic aortic (valve) stenosis with insufficiency: Secondary | ICD-10-CM | POA: Diagnosis not present

## 2017-08-14 DIAGNOSIS — E872 Acidosis: Secondary | ICD-10-CM | POA: Diagnosis not present

## 2017-08-14 DIAGNOSIS — E782 Mixed hyperlipidemia: Secondary | ICD-10-CM | POA: Diagnosis not present

## 2017-08-14 DIAGNOSIS — J189 Pneumonia, unspecified organism: Secondary | ICD-10-CM | POA: Diagnosis not present

## 2017-08-14 DIAGNOSIS — N183 Chronic kidney disease, stage 3 (moderate): Secondary | ICD-10-CM | POA: Diagnosis not present

## 2017-08-14 DIAGNOSIS — E1122 Type 2 diabetes mellitus with diabetic chronic kidney disease: Secondary | ICD-10-CM | POA: Diagnosis not present

## 2017-08-14 DIAGNOSIS — R069 Unspecified abnormalities of breathing: Secondary | ICD-10-CM | POA: Diagnosis not present

## 2017-08-14 DIAGNOSIS — Z79899 Other long term (current) drug therapy: Secondary | ICD-10-CM | POA: Diagnosis not present

## 2017-08-14 DIAGNOSIS — J90 Pleural effusion, not elsewhere classified: Secondary | ICD-10-CM | POA: Diagnosis not present

## 2017-08-14 DIAGNOSIS — J9602 Acute respiratory failure with hypercapnia: Secondary | ICD-10-CM | POA: Diagnosis not present

## 2017-08-14 DIAGNOSIS — I071 Rheumatic tricuspid insufficiency: Secondary | ICD-10-CM | POA: Diagnosis not present

## 2017-08-14 DIAGNOSIS — I130 Hypertensive heart and chronic kidney disease with heart failure and stage 1 through stage 4 chronic kidney disease, or unspecified chronic kidney disease: Secondary | ICD-10-CM | POA: Diagnosis not present

## 2017-08-14 DIAGNOSIS — R0602 Shortness of breath: Secondary | ICD-10-CM | POA: Diagnosis not present

## 2017-08-14 DIAGNOSIS — N179 Acute kidney failure, unspecified: Secondary | ICD-10-CM | POA: Diagnosis not present

## 2017-08-14 DIAGNOSIS — Z7982 Long term (current) use of aspirin: Secondary | ICD-10-CM | POA: Diagnosis not present

## 2017-08-14 DIAGNOSIS — J181 Lobar pneumonia, unspecified organism: Secondary | ICD-10-CM | POA: Diagnosis not present

## 2017-08-14 DIAGNOSIS — J9601 Acute respiratory failure with hypoxia: Secondary | ICD-10-CM | POA: Diagnosis not present

## 2017-08-14 DIAGNOSIS — J918 Pleural effusion in other conditions classified elsewhere: Secondary | ICD-10-CM | POA: Diagnosis not present

## 2017-08-14 DIAGNOSIS — N178 Other acute kidney failure: Secondary | ICD-10-CM | POA: Diagnosis not present

## 2017-08-14 DIAGNOSIS — N189 Chronic kidney disease, unspecified: Secondary | ICD-10-CM | POA: Diagnosis not present

## 2017-08-14 DIAGNOSIS — M109 Gout, unspecified: Secondary | ICD-10-CM | POA: Diagnosis not present

## 2017-08-14 LAB — GLUCOSE, CAPILLARY
GLUCOSE-CAPILLARY: 119 mg/dL — AB (ref 65–99)
Glucose-Capillary: 124 mg/dL — ABNORMAL HIGH (ref 65–99)
Glucose-Capillary: 129 mg/dL — ABNORMAL HIGH (ref 65–99)

## 2017-08-14 LAB — RENAL FUNCTION PANEL
ANION GAP: 14 (ref 5–15)
Albumin: 2.7 g/dL — ABNORMAL LOW (ref 3.5–5.0)
BUN: 39 mg/dL — ABNORMAL HIGH (ref 6–20)
CALCIUM: 8.6 mg/dL — AB (ref 8.9–10.3)
CO2: 28 mmol/L (ref 22–32)
CREATININE: 2.38 mg/dL — AB (ref 0.44–1.00)
Chloride: 103 mmol/L (ref 101–111)
GFR calc Af Amer: 23 mL/min — ABNORMAL LOW (ref >60)
GFR calc non Af Amer: 20 mL/min — ABNORMAL LOW (ref >60)
GLUCOSE: 128 mg/dL — AB (ref 65–99)
Phosphorus: 4.7 mg/dL — ABNORMAL HIGH (ref 2.5–4.6)
Potassium: 4 mmol/L (ref 3.5–5.1)
SODIUM: 145 mmol/L (ref 135–145)

## 2017-08-14 MED ORDER — LABETALOL HCL 200 MG PO TABS: 200.0000 mg | ORAL_TABLET | Freq: Two times a day (BID) | ORAL | 0 refills | Status: DC

## 2017-08-14 MED ORDER — FUROSEMIDE 40 MG PO TABS: 40.0000 mg | ORAL_TABLET | Freq: Every day | ORAL | 0 refills | Status: DC

## 2017-08-14 MED ORDER — OXYCODONE-ACETAMINOPHEN 5-325 MG PO TABS: 1.0000 | ORAL_TABLET | Freq: Four times a day (QID) | ORAL | 0 refills | Status: DC | PRN

## 2017-08-14 MED ORDER — HYDRALAZINE HCL 10 MG PO TABS: 10.0000 mg | ORAL_TABLET | Freq: Three times a day (TID) | ORAL | 0 refills | Status: DC

## 2017-08-14 NOTE — Progress Notes (Signed)
Subjective: Interval History: Patient complains of weakness otherwise feels good.  She denies any difficulty breathing.  She denies also any nausea or vomiting. Objective: Vital signs in last 24 hours: Temp:  [98 F (36.7 C)-98.7 F (37.1 C)] 98 F (36.7 C) (04/12 0550) Pulse Rate:  [67-77] 70 (04/12 0550) Resp:  [16-18] 16 (04/11 2125) BP: (129-160)/(70-91) 160/91 (04/12 0550) SpO2:  [85 %-98 %] 98 % (04/12 0550) Weight:  [101.8 kg (224 lb 6.9 oz)] 101.8 kg (224 lb 6.9 oz) (04/12 0500) Weight change:   Intake/Output from previous day: 04/11 0701 - 04/12 0700 In: 480 [P.O.:480] Out: -  Intake/Output this shift: No intake/output data recorded.  General appearance: alert, cooperative and no distress Resp: rales bilaterally Cardio: regular rate and rhythm Extremities: No edema  Lab Results: No results for input(Vance): WBC, HGB, HCT, PLT in the last 72 hours. BMET:  Recent Labs    08/13/17 1040 08/14/17 0447  NA 145 145  K 4.0 4.0  CL 102 103  CO2 31 28  GLUCOSE 179* 128*  BUN 38* 39*  CREATININE 2.72* 2.38*  CALCIUM 8.6* 8.6*   No results for input(Vance): PTH in the last 72 hours. Iron Studies:  No results for input(Vance): IRON, TIBC, TRANSFERRIN, FERRITIN in the last 72 hours.  Studies/Results: No results found.  I have reviewed the patient'Vance current medications.  Assessment/Plan: 1] acute kidney injury superimposed on chronic: Possibly ATN/prerenal/vancomycin associated acute kidney injury.  Her renal function is progressively improving.  Presently she is a symptomatic.  2] hypokalemia: Potassium is normal.  Potassium supplement has been discontinued. 3] hypertension: Her blood pressure is reasonably controlled 4] chronic kidney disease: Patient with history of renal artery stenosis.  Ultrasound of the kidney showed increased bilateral echogenicity consistent with chronic kidney injury 5] bone and mineral disorder: Calcium and phosphorus is in range.  Her PTH however is  high. 6] anemia: Her hemoglobin below  our target goal.  Her iron saturation is low and she might have superimposed iron deficiency anemia. 7] hypothyroidism 8] acidosis: Has corrected. Plan: 1] we will continue his present management 2] will start her on Nu-Iron 150 mg once a day as an outpatient. 3] Rocaltrol 0.25 mcg once a day as an outpatient.  4] we will check  renal panel in the morning.    LOS: 9 days   Kathleen Vance 08/14/2017,8:43 AM

## 2017-08-14 NOTE — Progress Notes (Signed)
IV removed, WNL. D/C instructions given to pt. Verbalized understanding. Report called and given to nursing staff at [facility]. EMS to transport patient.

## 2017-08-14 NOTE — Progress Notes (Signed)
Physical Therapy Treatment Patient Details Name: Kathleen Vance MRN: [id number] DOB: [birth year] Today's Date: 08/14/2017    History of Present Illness Kathleen Vance is a 67 year old female with a past medical history significant for hyperlipidemia, hypertension, type 2 diabetes, hypothyroidism, gout, renal artery stenosis, chronic kidney disease is stage II and a recent admission secondary to noncardiac chest pain which significant concern for gastroesophageal reflux disease; who presented secondary to increased shortness of breath, fever and general malaise.  Patient reports that for the last 2-3 days she has been experiencing shortness of breath on exertion and also at rest, express having chills and measured temperature up to 102 the night prior to admission    PT Comments    Pt admitted with above diagnosis. Pt currently with functional limitations due to the deficits listed below (see PT Problem List). Patient had a significant improvement in ambulation ability today using RW vs standard walker. Ambulated 150 feet with min guard. Patient does not have a RW at home. PT educated patient on difference between diminished O2 sats and deconditioning associated with being in hospital for over a week. Pt will benefit from skilled PT to increase their independence and safety with mobility to allow discharge to the venue listed below.      Follow Up Recommendations  SNF;Supervision for mobility/OOB     Equipment Recommendations  None recommended by PT(at this time.)    Recommendations for Other Services       Precautions / Restrictions Precautions Precautions: Fall Restrictions Weight Bearing Restrictions: No    Mobility  Bed Mobility Overal bed mobility: Modified Independent                Transfers Overall transfer level: Needs assistance Equipment used: Rolling walker (2 wheeled) Transfers: Sit to/from [facility] Sit to Stand: Min guard Stand pivot  transfers: Min guard          Ambulation/Gait Ambulation/Gait assistance: Min guard Ambulation Distance (Feet): 150 Feet Assistive device: Rolling walker (2 wheeled) Gait Pattern/deviations: Step-through pattern;Decreased step length - right;Decreased step length - left;Decreased stride length;Decreased stance time - right;Decreased weight shift to right;Antalgic Gait velocity: decreased   General Gait Details: very slow pace but much improved endurance with RW vs standard walker   Stairs             Wheelchair Mobility    Modified Rankin (Stroke Patients Only)       Balance Overall balance assessment: Needs assistance Sitting-balance support: Feet supported;No upper extremity supported Sitting balance-Leahy Scale: Good     Standing balance support: Bilateral upper extremity supported;During functional activity Standing balance-Leahy Scale: Fair                              Cognition Arousal/Alertness: Awake/alert Behavior During Therapy: WFL for tasks assessed/performed Overall Cognitive Status: Within Functional Limits for tasks assessed                                        Exercises      General Comments        Pertinent Vitals/Pain Pain Assessment: No/denies pain    Home Living Family/patient expects to be discharged to:: Private residence Living Arrangements: Spouse/significant other Available Help at Discharge: Family Type of Home: House Home Access: Stairs to enter   Home Layout: One level Home Equipment: Kasandra Knudsen -  single point;Shower seat;Walker - standard      Prior Function Level of Independence: Independent with assistive device(s)      Comments: standard walker   PT Goals (current goals can now be found in the care plan section) Acute Rehab PT Goals Patient Stated Goal: to get stronger to go home  PT Goal Formulation: With patient/family Potential to Achieve Goals: Good Progress towards PT goals:  Progressing toward goals    Frequency    Min 3X/week      PT Plan      Co-evaluation              AM-PAC PT "6 Clicks" Daily Activity  Outcome Measure  Difficulty turning over in bed (including adjusting bedclothes, sheets and blankets)?: A Little Difficulty moving from lying on back to sitting on the side of the bed? : A Little Difficulty sitting down on and standing up from a chair with arms (e.g., wheelchair, bedside commode, etc,.)?: A Little Help needed moving to and from a bed to chair (including a wheelchair)?: A Little Help needed walking in hospital room?: A Little Help needed climbing 3-5 steps with a railing? : A Lot 6 Click Score: 17    End of Session Equipment Utilized During Treatment: Gait belt Activity Tolerance: Patient limited by fatigue;Patient tolerated treatment well Patient left: in bed;with call bell/phone within reach Nurse Communication: Mobility status PT Visit Diagnosis: Unsteadiness on feet (R26.81);Other abnormalities of gait and mobility (R26.89);Muscle weakness (generalized) (M62.81)     Time: 3846-6599 PT Time Calculation (min) (ACUTE ONLY): 32 min  Charges:  $Gait Training: 8-22 mins $Therapeutic Activity: 8-22 mins                    G Codes:       Loney Peto D. Hartnett-Rands, MS, PT Per [place] #[id number] 08/14/2017, 1:34 PM

## 2017-08-14 NOTE — Care Management Important Message (Signed)
Important Message  Patient Details  Name: Kathleen Vance MRN: [id number] Date of Birth: April 21, 1951   Medicare Important Message Given:  Yes    Shelda Altes 08/14/2017, 12:50 PM

## 2017-08-14 NOTE — Discharge Summary (Addendum)
Physician Discharge Summary  Kathleen Vance ION:[id number] DOB: 10-08-1950 DOA: 08/05/2017  PCP: Celene Squibb, MD  Admit date: 08/05/2017 Discharge date: 08/14/2017  Admitted From: Home Disposition:  Home   Recommendations for Outpatient Follow-up:  1. Follow up with PCP in 1-2 weeks 2. Please obtain BMP in one week    Discharge Condition: Stable CODE STATUS: FULL Diet recommendation: Heart Healthy   Brief/Interim Summary: 67 year old female who presented with dyspnea, fever and malaise. She does have significant past medical history for dyslipidemia, hypertension, type 2 diabetes mellitus, hypothyroidism, and chronic kidney disease stage II. Patient reported 2-3 days of worsening dyspnea, associated with chills and fever, associated with lower extremity edema. On the initial physical examination her blood pressure 171/93, heart rate 87, respiratory rate 14, oxygen saturation 95%, temperature 98.7. She had moist mucous membranes, lungs were clear to auscultation bilaterally, heart S1-S2 present and rhythmic, abdomen protuberant, 1+ lower extremity edema. Sodium 141, potassium 4.4, chloride 105, bicarbonate 24, glucose 114, BUN 30, creatinine 1.98, white count 17.4, hemoglobin 9.5, hematocrit 32.8, platelets 599, urinalysis pH 5.0, 100 protein and specific gravity 1.017, red cells 0-5, white cells 6-30. Chest x-ray with right rotation, good penetration, opacity at the left base. EKG normal sinus rhythm, normal axis and normal intervals. Patient was admitted to the hospital with the working diagnosis of left lower lobe pneumonia, complicated by acuteon chronic renal failure. Renal was consulted to assist with management    Discharge Diagnoses:  Acutehypercapnic and hypoxic respiratory failure -secondary to pneumonia -initially on BiPAP -now stable on2LNC>>weaned to room air -initially on vanco and aztreonam -had one dose zosyn on 4/3 -finish 8 days of amox/clav  4/11  Pneumonia -finished 8 days of abx on 08/13/17 -stable on RA now  Acute on chronic renal failure--CKD stage 3 -baseline creatinine 1.7-1.9 -serum creatinine peaked 3.42 -due to sepsis and hemodynamic changes in setting of Left renal artery stenosis -suspect pt may have new renal function baseline -serum creatinine 2.38 on day of discharge  Diabetes Mellitus type 2, controlled -07/22/17 A1C--6.8 -continue ISS -diet controlled prior to hospitalization  Hypothyroidism -continue synthroid  Mixed Hyperlipidemia -continue statin and fenofibrate  Essential Hypertension -continue reduced dose labetalol -continue reduced dose hydralazine  Aortic Stenosis -followed by cardiology--Dr. Bronson Ing  Chronic diastolic CHF -lasix per renal -appears euvolemic clinically -pt was on lasix 40 daily prior to admission-->restart on 08/16/17  Morbid Obesity -BMI 38.94 -lifestyle modification     Discharge Instructions   Allergies as of 08/14/2017      Reactions   Bactrim [sulfamethoxazole-trimethoprim]    Nausea, diarrhea   Ciprofloxacin Anaphylaxis   Nitrofurantoin Anaphylaxis   Metronidazole    "sick"   Amoxicillin Rash   Erythromycin Rash      Medication List    STOP taking these medications   allopurinol 300 MG tablet Commonly known as:  ZYLOPRIM   MEDROL (PAK) PO   pantoprazole 40 MG tablet Commonly known as:  PROTONIX     TAKE these medications   aspirin 81 MG tablet Take 81 mg by mouth daily.   fenofibrate 160 MG tablet Commonly known as:  LOFIBRA Take 1 tablet (160 mg total) daily by mouth.   furosemide 40 MG tablet Commonly known as:  LASIX Take 1 tablet (40 mg total) by mouth daily. Start taking on:  08/16/2017 What changed:    when to take this  additional instructions  These instructions start on 08/16/2017. If you are unsure what to do until then, ask your  doctor or other care provider.   gabapentin 300 MG capsule Commonly known  as:  NEURONTIN Take 300 mg by mouth 2 (two) times daily.   hydrALAZINE 10 MG tablet Commonly known as:  APRESOLINE Take 1 tablet (10 mg total) by mouth every 8 (eight) hours. What changed:    medication strength  how much to take  how to take this  when to take this  additional instructions   labetalol 200 MG tablet Commonly known as:  NORMODYNE Take 1 tablet (200 mg total) by mouth 2 (two) times daily. What changed:  how much to take   levothyroxine 100 MCG tablet Commonly known as:  SYNTHROID, LEVOTHROID Take 100 mcg by mouth daily before breakfast.   oxyCODONE-acetaminophen 5-325 MG tablet Commonly known as:  PERCOCET Take 1 tablet by mouth every 6 (six) hours as needed for severe pain.   pravastatin 20 MG tablet Commonly known as:  PRAVACHOL Take 20 mg by mouth daily.   Vitamin D (Ergocalciferol) 50000 units Caps capsule Commonly known as:  DRISDOL Take 50,000 Units every 7 (seven) days by mouth.       Contact information for follow-up providers    Fran Lowes, MD Follow up in 4 week(s).   Specialty:  Nephrology Contact information: [address] [address] [phone]            Contact information for after-discharge care    Destination    [facility] .   Service:  Skilled Nursing Contact information: [address] [place] [phone]                 Allergies  Allergen Reactions  . Bactrim [Sulfamethoxazole-Trimethoprim]     Nausea, diarrhea  . Ciprofloxacin Anaphylaxis  . Nitrofurantoin Anaphylaxis  . Metronidazole     "sick"  . Amoxicillin Rash  . Erythromycin Rash    Consultations:  Renal    Procedures/Studies: Dg Chest 2 View  Result Date: 08/05/2017 CLINICAL DATA:  Shortness of breath and right chest pain for 2 weeks. EXAM: CHEST - 2 VIEW COMPARISON:  CT chest and PA and lateral chest 07/21/2016. FINDINGS: The patient has a new moderate  left pleural effusion with basilar airspace disease. Right lung is clear. No pneumothorax. There is cardiomegaly. Atherosclerosis noted. No acute bony abnormality. IMPRESSION: Moderate left pleural effusion and basilar airspace disease are new since the most recent examination. Airspace disease could be due to atelectasis or infection. Cardiomegaly without edema. Electronically Signed   By: Inge Rise M.D.   On: 08/05/2017 11:49   Dg Chest 2 View  Result Date: 07/21/2017 CLINICAL DATA:  Chest pain radiating to the back and left arm since lunch. EXAM: CHEST - 2 VIEW COMPARISON:  03/26/2009 FINDINGS: Stable cardiomegaly with minimal aortic atherosclerosis. No effusion nor overt pulmonary edema. There is bibasilar atelectasis. No acute osseous abnormality. IMPRESSION: Stable cardiomegaly with aortic atherosclerosis. Bibasilar atelectasis is noted. No overt pulmonary edema, effusion or pneumothorax. Electronically Signed   By: Ashley Royalty M.D.   On: 07/21/2017 19:38   Ct Angio Chest Pe W/cm &/or Wo Cm  Result Date: 07/21/2017 CLINICAL DATA:  Chest pain with elevated D-dimer EXAM: CT ANGIOGRAPHY CHEST WITH CONTRAST TECHNIQUE: Multidetector CT imaging of the chest was performed using the standard protocol during bolus administration of intravenous contrast. Multiplanar CT image reconstructions and MIPs were obtained to evaluate the vascular anatomy. CONTRAST:  24mL ISOVUE-370 IOPAMIDOL (ISOVUE-370) INJECTION 76% COMPARISON:  Chest x-ray  07/21/2017, CT chest 08/11/2004 FINDINGS: Cardiovascular: Satisfactory opacification of the pulmonary arteries to the segmental level. No evidence of pulmonary embolism. Nonaneurysmal aorta. No dissection. Common origin of the left common carotid and right brachiocephalic artery from the arch. Mild cardiomegaly. Small pericardial effusion. Mediastinum/Nodes: Midline trachea. No thyroid mass. Mildly prominent mediastinal lymph nodes, lymph node adjacent to the aortic arch  measures 1 cm. Left low paratracheal lymph node measures 1 cm. Subcarinal lymph node measures 15 mm. Esophagus within normal limits Lungs/Pleura: Lungs are clear. No pleural effusion or pneumothorax. Upper Abdomen: Vague 11 mm hypodensity in the posterior right hepatic lobe. No acute abnormality Musculoskeletal: Degenerative changes of the spine. No acute or suspicious bone lesion. Review of the MIP images confirms the above findings. IMPRESSION: 1. Negative for acute pulmonary embolus or aortic dissection 2. Clear lung fields 3. Mild nonspecific mediastinal adenopathy, no significant change compared to prior chest CT. 4. 11 mm hypodense lesion posterior right hepatic lobe. When the patient is clinically stable and able to follow directions and hold their breath (preferably as an outpatient) further evaluation with dedicated abdominal MRI should be considered. Electronically Signed   By: Donavan Foil M.D.   On: 07/21/2017 22:50   US Renal  Result Date: 08/07/2017 CLINICAL DATA:  Acute on chronic renal failure. EXAM: RENAL / URINARY TRACT ULTRASOUND COMPLETE COMPARISON:  None. FINDINGS: Right Kidney: Length: 9.9 cm. 1.3 cm simple cyst is seen in lower pole. Increased echogenicity of renal parenchyma is noted. No mass or hydronephrosis visualized. Left Kidney: Length: 9.9 cm. 6 mm nonobstructive calculus is noted in midpole. Increased echogenicity of renal parenchyma is noted. No mass or hydronephrosis visualized. Bladder: Not visualized due to body habitus. IMPRESSION: Increased echogenicity of renal parenchyma is noted bilaterally consistent with medical renal disease. Nonobstructive left renal calculus. No hydronephrosis or renal obstruction is noted. Electronically Signed   By: Marijo Conception, M.D.   On: 08/07/2017 11:01        Discharge Exam: Vitals:   08/14/17 0550 08/14/17 1127  BP: (!) 160/91   Pulse: 70   Resp:    Temp: 98 F (36.7 C)   SpO2: 98% 97%   Vitals:   08/13/17 2125 08/14/17  0500 08/14/17 0550 08/14/17 1127  BP:   (!) 160/91   Pulse: 72  70   Resp: 16     Temp:   98 F (36.7 C)   TempSrc:   Oral   SpO2: 97%  98% 97%  Weight:  101.8 kg (224 lb 6.9 oz)    Height:        General: Pt is alert, awake, not in acute distress Cardiovascular: RRR, S1/S2 +, no rubs, no gallops Respiratory: CTA bilaterally, no wheezing, no rhonchi Abdominal: Soft, NT, ND, bowel sounds + Extremities: no edema, no cyanosis   The results of significant diagnostics from this hospitalization (including imaging, microbiology, ancillary and laboratory) are listed below for reference.    Significant Diagnostic Studies: Dg Chest 2 View  Result Date: 08/05/2017 CLINICAL DATA:  Shortness of breath and right chest pain for 2 weeks. EXAM: CHEST - 2 VIEW COMPARISON:  CT chest and PA and lateral chest 07/21/2016. FINDINGS: The patient has a new moderate left pleural effusion with basilar airspace disease. Right lung is clear. No pneumothorax. There is cardiomegaly. Atherosclerosis noted. No acute bony abnormality. IMPRESSION: Moderate left pleural effusion and basilar airspace disease are new since the most recent examination. Airspace disease could be due to atelectasis or infection. Cardiomegaly  without edema. Electronically Signed   By: Inge Rise M.D.   On: 08/05/2017 11:49   Dg Chest 2 View  Result Date: 07/21/2017 CLINICAL DATA:  Chest pain radiating to the back and left arm since lunch. EXAM: CHEST - 2 VIEW COMPARISON:  03/26/2009 FINDINGS: Stable cardiomegaly with minimal aortic atherosclerosis. No effusion nor overt pulmonary edema. There is bibasilar atelectasis. No acute osseous abnormality. IMPRESSION: Stable cardiomegaly with aortic atherosclerosis. Bibasilar atelectasis is noted. No overt pulmonary edema, effusion or pneumothorax. Electronically Signed   By: Ashley Royalty M.D.   On: 07/21/2017 19:38   Ct Angio Chest Pe W/cm &/or Wo Cm  Result Date: 07/21/2017 CLINICAL DATA:  Chest  pain with elevated D-dimer EXAM: CT ANGIOGRAPHY CHEST WITH CONTRAST TECHNIQUE: Multidetector CT imaging of the chest was performed using the standard protocol during bolus administration of intravenous contrast. Multiplanar CT image reconstructions and MIPs were obtained to evaluate the vascular anatomy. CONTRAST:  70mL ISOVUE-370 IOPAMIDOL (ISOVUE-370) INJECTION 76% COMPARISON:  Chest x-ray 07/21/2017, CT chest 08/11/2004 FINDINGS: Cardiovascular: Satisfactory opacification of the pulmonary arteries to the segmental level. No evidence of pulmonary embolism. Nonaneurysmal aorta. No dissection. Common origin of the left common carotid and right brachiocephalic artery from the arch. Mild cardiomegaly. Small pericardial effusion. Mediastinum/Nodes: Midline trachea. No thyroid mass. Mildly prominent mediastinal lymph nodes, lymph node adjacent to the aortic arch measures 1 cm. Left low paratracheal lymph node measures 1 cm. Subcarinal lymph node measures 15 mm. Esophagus within normal limits Lungs/Pleura: Lungs are clear. No pleural effusion or pneumothorax. Upper Abdomen: Vague 11 mm hypodensity in the posterior right hepatic lobe. No acute abnormality Musculoskeletal: Degenerative changes of the spine. No acute or suspicious bone lesion. Review of the MIP images confirms the above findings. IMPRESSION: 1. Negative for acute pulmonary embolus or aortic dissection 2. Clear lung fields 3. Mild nonspecific mediastinal adenopathy, no significant change compared to prior chest CT. 4. 11 mm hypodense lesion posterior right hepatic lobe. When the patient is clinically stable and able to follow directions and hold their breath (preferably as an outpatient) further evaluation with dedicated abdominal MRI should be considered. Electronically Signed   By: Donavan Foil M.D.   On: 07/21/2017 22:50   US Renal  Result Date: 08/07/2017 CLINICAL DATA:  Acute on chronic renal failure. EXAM: RENAL / URINARY TRACT ULTRASOUND COMPLETE  COMPARISON:  None. FINDINGS: Right Kidney: Length: 9.9 cm. 1.3 cm simple cyst is seen in lower pole. Increased echogenicity of renal parenchyma is noted. No mass or hydronephrosis visualized. Left Kidney: Length: 9.9 cm. 6 mm nonobstructive calculus is noted in midpole. Increased echogenicity of renal parenchyma is noted. No mass or hydronephrosis visualized. Bladder: Not visualized due to body habitus. IMPRESSION: Increased echogenicity of renal parenchyma is noted bilaterally consistent with medical renal disease. Nonobstructive left renal calculus. No hydronephrosis or renal obstruction is noted. Electronically Signed   By: Marijo Conception, M.D.   On: 08/07/2017 11:01     Microbiology: Recent Results (from the past 240 hour(s))  MRSA PCR Screening     Status: None   Collection Time: 08/08/17  6:12 PM  Result Value Ref Range Status   MRSA by PCR NEGATIVE NEGATIVE Final    Comment:        The GeneXpert MRSA Assay (FDA approved for NASAL specimens only), is one component of a comprehensive MRSA colonization surveillance program. It is not intended to diagnose MRSA infection nor to guide or monitor treatment for MRSA infections. Performed at [facility]  [facility], [address], [place], [place] [address]      Labs: Basic Metabolic Panel: Recent Labs  Lab 08/08/17 [phone] 08/09/17 0600 08/10/17 0442 08/11/17 0407 08/12/17 0428 08/13/17 1040 08/14/17 0447  NA 137  138 141 144 144  143 145 145 145  K 5.9*  5.9* 4.2 3.9 3.4*  3.4* 3.9 4.0 4.0  CL 102  103 106 107 104  104 104 102 103  CO2 22  22 22 26 27  28 29 31 28   GLUCOSE 138*  138* 101* 131* 114*  115* 138* 179* 128*  BUN 44*  44* 44* 40* 37*  38* 38* 38* 39*  CREATININE 3.95*  3.97* 3.95* 3.42* 2.80*  2.91* 2.64* 2.72* 2.38*  CALCIUM 7.8*  7.9* 7.2*  7.5* 7.3* 7.6*  7.5* 8.2* 8.6* 8.6*  PHOS 8.5* 7.4*  --  5.5* 5.1*  --  4.7*   Liver Function Tests: Recent Labs  Lab 08/09/17 0600 08/10/17 0442 08/11/17 0407  08/12/17 0428 08/14/17 0447  AST  --  14*  --   --   --   ALT  --  14  --   --   --   ALKPHOS  --  32*  --   --   --   BILITOT  --  0.7  --   --   --   PROT  --  6.3*  --   --   --   ALBUMIN 2.5* 2.4* 2.5* 2.6* 2.7*   No results for input(s): LIPASE, AMYLASE in the last 168 hours. No results for input(s): AMMONIA in the last 168 hours. CBC: Recent Labs  Lab 08/09/17 0600  WBC 7.7  HGB 8.4*  HCT 29.1*  MCV 93.0  PLT 495*   Cardiac Enzymes: No results for input(s): CKTOTAL, CKMB, CKMBINDEX, TROPONINI in the last 168 hours. BNP: Invalid input(s): POCBNP CBG: Recent Labs  Lab 08/13/17 1138 08/13/17 1618 08/13/17 2116 08/14/17 0745 08/14/17 1109  GLUCAP 132* 114* 133* 124* 129*    Time coordinating discharge:  36 minutes  Signed:  Orson Eva, DO [facility] Pager: [phone] 08/14/2017, 1:18 PM

## 2017-08-14 NOTE — Clinical Social Work Placement (Signed)
   CLINICAL SOCIAL WORK PLACEMENT  NOTE  Date:  08/14/2017  Patient Details  Name: Kathleen Vance MRN: [id number] Date of Birth: [birth year]  Clinical Social Work is seeking post-discharge placement for this patient at the [place] level of care (*CSW will initial, date and re-position this form in  chart as items are completed):  Yes   Patient/family provided with [facility] Work Department's list of facilities offering this level of care within the geographic area requested by the patient (or if unable, by the patient's family).  Yes   Patient/family informed of their freedom to choose among providers that offer the needed level of care, that participate in Medicare, Medicaid or managed care program needed by the patient, have an available bed and are willing to accept the patient.  Yes   Patient/family informed of [facility]'s ownership interest in [facility] and [facility], as well as of the fact that they are under no obligation to receive care at these facilities.  PASRR submitted to EDS on 08/11/17     PASRR number received on 08/11/17     Existing PASRR number confirmed on       FL2 transmitted to all facilities in geographic area requested by pt/family on 08/11/17     FL2 transmitted to all facilities within larger geographic area on       Patient informed that his/her managed care company has contracts with or will negotiate with certain facilities, including the following:            Patient/family informed of bed offers received.  Patient chooses bed at [facility]     Physician recommends and patient chooses bed at      Patient to be transferred to [facility] on 08/14/17.  Patient to be transferred to facility by [facility]     Patient family notified on 08/14/17 of transfer.  Name of family member notified:  spouse     PHYSICIAN       Additional Comment: Chryl Heck at [facility] was  informed and discharge clinicals were sent on [employer]. LCSW signing off.   _______________________________________________ Ihor Gully, LCSW 08/14/2017, 1:46 PM

## 2017-08-14 NOTE — Progress Notes (Signed)
Patient stated she got SOB on room air when she walked to restroom. When she returned to bed she replaced her 2L [place] and was able to recover. BBS diminished SpO2 97% on 2L [place].

## 2017-08-24 DIAGNOSIS — E1140 Type 2 diabetes mellitus with diabetic neuropathy, unspecified: Secondary | ICD-10-CM | POA: Diagnosis not present

## 2017-08-24 DIAGNOSIS — J9601 Acute respiratory failure with hypoxia: Secondary | ICD-10-CM | POA: Diagnosis not present

## 2017-08-24 DIAGNOSIS — D631 Anemia in chronic kidney disease: Secondary | ICD-10-CM | POA: Diagnosis not present

## 2017-08-24 DIAGNOSIS — J918 Pleural effusion in other conditions classified elsewhere: Secondary | ICD-10-CM | POA: Diagnosis not present

## 2017-08-24 DIAGNOSIS — D6489 Other specified anemias: Secondary | ICD-10-CM | POA: Diagnosis not present

## 2017-08-24 DIAGNOSIS — J90 Pleural effusion, not elsewhere classified: Secondary | ICD-10-CM | POA: Diagnosis not present

## 2017-08-24 DIAGNOSIS — J189 Pneumonia, unspecified organism: Secondary | ICD-10-CM | POA: Diagnosis not present

## 2017-08-24 DIAGNOSIS — N183 Chronic kidney disease, stage 3 (moderate): Secondary | ICD-10-CM | POA: Diagnosis not present

## 2017-08-24 DIAGNOSIS — N179 Acute kidney failure, unspecified: Secondary | ICD-10-CM | POA: Diagnosis not present

## 2017-08-24 DIAGNOSIS — E559 Vitamin D deficiency, unspecified: Secondary | ICD-10-CM | POA: Diagnosis not present

## 2017-08-24 DIAGNOSIS — M6281 Muscle weakness (generalized): Secondary | ICD-10-CM | POA: Diagnosis not present

## 2017-08-24 DIAGNOSIS — I5032 Chronic diastolic (congestive) heart failure: Secondary | ICD-10-CM | POA: Diagnosis not present

## 2017-08-24 DIAGNOSIS — E782 Mixed hyperlipidemia: Secondary | ICD-10-CM | POA: Diagnosis not present

## 2017-08-24 DIAGNOSIS — Z79899 Other long term (current) drug therapy: Secondary | ICD-10-CM | POA: Diagnosis not present

## 2017-08-24 DIAGNOSIS — M109 Gout, unspecified: Secondary | ICD-10-CM | POA: Diagnosis not present

## 2017-08-24 DIAGNOSIS — R069 Unspecified abnormalities of breathing: Secondary | ICD-10-CM | POA: Diagnosis not present

## 2017-08-24 DIAGNOSIS — E1122 Type 2 diabetes mellitus with diabetic chronic kidney disease: Secondary | ICD-10-CM | POA: Diagnosis not present

## 2017-08-24 DIAGNOSIS — I130 Hypertensive heart and chronic kidney disease with heart failure and stage 1 through stage 4 chronic kidney disease, or unspecified chronic kidney disease: Secondary | ICD-10-CM | POA: Diagnosis not present

## 2017-08-24 DIAGNOSIS — E785 Hyperlipidemia, unspecified: Secondary | ICD-10-CM | POA: Diagnosis not present

## 2017-08-24 DIAGNOSIS — E039 Hypothyroidism, unspecified: Secondary | ICD-10-CM | POA: Diagnosis not present

## 2017-08-24 DIAGNOSIS — I350 Nonrheumatic aortic (valve) stenosis: Secondary | ICD-10-CM | POA: Diagnosis not present

## 2017-08-24 DIAGNOSIS — Z780 Asymptomatic menopausal state: Secondary | ICD-10-CM | POA: Diagnosis not present

## 2017-08-24 DIAGNOSIS — Z7982 Long term (current) use of aspirin: Secondary | ICD-10-CM | POA: Diagnosis not present

## 2017-08-24 DIAGNOSIS — R0989 Other specified symptoms and signs involving the circulatory and respiratory systems: Secondary | ICD-10-CM | POA: Diagnosis not present

## 2017-08-24 DIAGNOSIS — N189 Chronic kidney disease, unspecified: Secondary | ICD-10-CM | POA: Diagnosis not present

## 2017-08-24 DIAGNOSIS — R2689 Other abnormalities of gait and mobility: Secondary | ICD-10-CM | POA: Diagnosis not present

## 2017-08-24 DIAGNOSIS — Q253 Supravalvular aortic stenosis: Secondary | ICD-10-CM | POA: Diagnosis not present

## 2017-08-24 DIAGNOSIS — R846 Abnormal cytological findings in specimens from respiratory organs and thorax: Secondary | ICD-10-CM | POA: Diagnosis not present

## 2017-08-24 DIAGNOSIS — J9622 Acute and chronic respiratory failure with hypercapnia: Secondary | ICD-10-CM | POA: Diagnosis not present

## 2017-08-24 DIAGNOSIS — J9602 Acute respiratory failure with hypercapnia: Secondary | ICD-10-CM | POA: Diagnosis not present

## 2017-08-24 DIAGNOSIS — J168 Pneumonia due to other specified infectious organisms: Secondary | ICD-10-CM | POA: Diagnosis not present

## 2017-08-24 DIAGNOSIS — I071 Rheumatic tricuspid insufficiency: Secondary | ICD-10-CM | POA: Diagnosis not present

## 2017-08-24 DIAGNOSIS — Z881 Allergy status to other antibiotic agents status: Secondary | ICD-10-CM | POA: Diagnosis not present

## 2017-08-31 DIAGNOSIS — J168 Pneumonia due to other specified infectious organisms: Secondary | ICD-10-CM | POA: Diagnosis not present

## 2017-08-31 DIAGNOSIS — E1122 Type 2 diabetes mellitus with diabetic chronic kidney disease: Secondary | ICD-10-CM | POA: Diagnosis not present

## 2017-08-31 DIAGNOSIS — E782 Mixed hyperlipidemia: Secondary | ICD-10-CM | POA: Diagnosis not present

## 2017-08-31 DIAGNOSIS — E1140 Type 2 diabetes mellitus with diabetic neuropathy, unspecified: Secondary | ICD-10-CM | POA: Diagnosis not present

## 2017-08-31 DIAGNOSIS — Q253 Supravalvular aortic stenosis: Secondary | ICD-10-CM | POA: Diagnosis not present

## 2017-08-31 DIAGNOSIS — J9601 Acute respiratory failure with hypoxia: Secondary | ICD-10-CM | POA: Diagnosis not present

## 2017-08-31 DIAGNOSIS — I5032 Chronic diastolic (congestive) heart failure: Secondary | ICD-10-CM | POA: Diagnosis not present

## 2017-08-31 DIAGNOSIS — J918 Pleural effusion in other conditions classified elsewhere: Secondary | ICD-10-CM | POA: Diagnosis not present

## 2017-08-31 DIAGNOSIS — E785 Hyperlipidemia, unspecified: Secondary | ICD-10-CM | POA: Diagnosis not present

## 2017-08-31 DIAGNOSIS — I130 Hypertensive heart and chronic kidney disease with heart failure and stage 1 through stage 4 chronic kidney disease, or unspecified chronic kidney disease: Secondary | ICD-10-CM | POA: Diagnosis not present

## 2017-08-31 DIAGNOSIS — J9622 Acute and chronic respiratory failure with hypercapnia: Secondary | ICD-10-CM | POA: Diagnosis not present

## 2017-08-31 DIAGNOSIS — E039 Hypothyroidism, unspecified: Secondary | ICD-10-CM | POA: Diagnosis not present

## 2017-08-31 DIAGNOSIS — Z881 Allergy status to other antibiotic agents status: Secondary | ICD-10-CM | POA: Diagnosis not present

## 2017-08-31 DIAGNOSIS — N179 Acute kidney failure, unspecified: Secondary | ICD-10-CM | POA: Diagnosis not present

## 2017-08-31 DIAGNOSIS — J189 Pneumonia, unspecified organism: Secondary | ICD-10-CM | POA: Diagnosis not present

## 2017-08-31 DIAGNOSIS — N183 Chronic kidney disease, stage 3 (moderate): Secondary | ICD-10-CM | POA: Diagnosis not present

## 2017-08-31 DIAGNOSIS — I350 Nonrheumatic aortic (valve) stenosis: Secondary | ICD-10-CM | POA: Diagnosis not present

## 2017-08-31 DIAGNOSIS — D6489 Other specified anemias: Secondary | ICD-10-CM | POA: Diagnosis not present

## 2017-08-31 DIAGNOSIS — M6281 Muscle weakness (generalized): Secondary | ICD-10-CM | POA: Diagnosis not present

## 2017-08-31 DIAGNOSIS — J9602 Acute respiratory failure with hypercapnia: Secondary | ICD-10-CM | POA: Diagnosis not present

## 2017-08-31 DIAGNOSIS — R2689 Other abnormalities of gait and mobility: Secondary | ICD-10-CM | POA: Diagnosis not present

## 2017-08-31 DIAGNOSIS — N189 Chronic kidney disease, unspecified: Secondary | ICD-10-CM | POA: Diagnosis not present

## 2017-09-07 DIAGNOSIS — M1000 Idiopathic gout, unspecified site: Secondary | ICD-10-CM | POA: Diagnosis not present

## 2017-09-07 DIAGNOSIS — I350 Nonrheumatic aortic (valve) stenosis: Secondary | ICD-10-CM | POA: Diagnosis not present

## 2017-09-07 DIAGNOSIS — E782 Mixed hyperlipidemia: Secondary | ICD-10-CM | POA: Diagnosis not present

## 2017-09-07 DIAGNOSIS — I509 Heart failure, unspecified: Secondary | ICD-10-CM | POA: Diagnosis not present

## 2017-09-07 DIAGNOSIS — D72829 Elevated white blood cell count, unspecified: Secondary | ICD-10-CM | POA: Diagnosis not present

## 2017-09-07 DIAGNOSIS — N184 Chronic kidney disease, stage 4 (severe): Secondary | ICD-10-CM | POA: Diagnosis not present

## 2017-09-07 DIAGNOSIS — Z7982 Long term (current) use of aspirin: Secondary | ICD-10-CM | POA: Diagnosis not present

## 2017-09-07 DIAGNOSIS — E039 Hypothyroidism, unspecified: Secondary | ICD-10-CM | POA: Diagnosis not present

## 2017-09-07 DIAGNOSIS — Z9181 History of falling: Secondary | ICD-10-CM | POA: Diagnosis not present

## 2017-09-07 DIAGNOSIS — N183 Chronic kidney disease, stage 3 (moderate): Secondary | ICD-10-CM | POA: Diagnosis not present

## 2017-09-07 DIAGNOSIS — J449 Chronic obstructive pulmonary disease, unspecified: Secondary | ICD-10-CM | POA: Diagnosis not present

## 2017-09-07 DIAGNOSIS — M109 Gout, unspecified: Secondary | ICD-10-CM | POA: Diagnosis not present

## 2017-09-07 DIAGNOSIS — Z9981 Dependence on supplemental oxygen: Secondary | ICD-10-CM | POA: Diagnosis not present

## 2017-09-07 DIAGNOSIS — E785 Hyperlipidemia, unspecified: Secondary | ICD-10-CM | POA: Diagnosis not present

## 2017-09-07 DIAGNOSIS — E559 Vitamin D deficiency, unspecified: Secondary | ICD-10-CM | POA: Diagnosis not present

## 2017-09-07 DIAGNOSIS — I130 Hypertensive heart and chronic kidney disease with heart failure and stage 1 through stage 4 chronic kidney disease, or unspecified chronic kidney disease: Secondary | ICD-10-CM | POA: Diagnosis not present

## 2017-09-07 DIAGNOSIS — D631 Anemia in chronic kidney disease: Secondary | ICD-10-CM | POA: Diagnosis not present

## 2017-09-09 DIAGNOSIS — M109 Gout, unspecified: Secondary | ICD-10-CM | POA: Diagnosis not present

## 2017-09-09 DIAGNOSIS — J449 Chronic obstructive pulmonary disease, unspecified: Secondary | ICD-10-CM | POA: Diagnosis not present

## 2017-09-09 DIAGNOSIS — N183 Chronic kidney disease, stage 3 (moderate): Secondary | ICD-10-CM | POA: Diagnosis not present

## 2017-09-09 DIAGNOSIS — I350 Nonrheumatic aortic (valve) stenosis: Secondary | ICD-10-CM | POA: Diagnosis not present

## 2017-09-09 DIAGNOSIS — E559 Vitamin D deficiency, unspecified: Secondary | ICD-10-CM | POA: Diagnosis not present

## 2017-09-09 DIAGNOSIS — D631 Anemia in chronic kidney disease: Secondary | ICD-10-CM | POA: Diagnosis not present

## 2017-09-09 DIAGNOSIS — E039 Hypothyroidism, unspecified: Secondary | ICD-10-CM | POA: Diagnosis not present

## 2017-09-09 DIAGNOSIS — Z9181 History of falling: Secondary | ICD-10-CM | POA: Diagnosis not present

## 2017-09-09 DIAGNOSIS — Z9981 Dependence on supplemental oxygen: Secondary | ICD-10-CM | POA: Diagnosis not present

## 2017-09-09 DIAGNOSIS — Z7982 Long term (current) use of aspirin: Secondary | ICD-10-CM | POA: Diagnosis not present

## 2017-09-09 DIAGNOSIS — I509 Heart failure, unspecified: Secondary | ICD-10-CM | POA: Diagnosis not present

## 2017-09-09 DIAGNOSIS — I130 Hypertensive heart and chronic kidney disease with heart failure and stage 1 through stage 4 chronic kidney disease, or unspecified chronic kidney disease: Secondary | ICD-10-CM | POA: Diagnosis not present

## 2017-09-09 DIAGNOSIS — E785 Hyperlipidemia, unspecified: Secondary | ICD-10-CM | POA: Diagnosis not present

## 2017-09-10 DIAGNOSIS — D631 Anemia in chronic kidney disease: Secondary | ICD-10-CM | POA: Diagnosis not present

## 2017-09-10 DIAGNOSIS — M109 Gout, unspecified: Secondary | ICD-10-CM | POA: Diagnosis not present

## 2017-09-10 DIAGNOSIS — I130 Hypertensive heart and chronic kidney disease with heart failure and stage 1 through stage 4 chronic kidney disease, or unspecified chronic kidney disease: Secondary | ICD-10-CM | POA: Diagnosis not present

## 2017-09-10 DIAGNOSIS — I509 Heart failure, unspecified: Secondary | ICD-10-CM | POA: Diagnosis not present

## 2017-09-10 DIAGNOSIS — E785 Hyperlipidemia, unspecified: Secondary | ICD-10-CM | POA: Diagnosis not present

## 2017-09-10 DIAGNOSIS — Z9981 Dependence on supplemental oxygen: Secondary | ICD-10-CM | POA: Diagnosis not present

## 2017-09-10 DIAGNOSIS — E039 Hypothyroidism, unspecified: Secondary | ICD-10-CM | POA: Diagnosis not present

## 2017-09-10 DIAGNOSIS — I350 Nonrheumatic aortic (valve) stenosis: Secondary | ICD-10-CM | POA: Diagnosis not present

## 2017-09-10 DIAGNOSIS — Z9181 History of falling: Secondary | ICD-10-CM | POA: Diagnosis not present

## 2017-09-10 DIAGNOSIS — N183 Chronic kidney disease, stage 3 (moderate): Secondary | ICD-10-CM | POA: Diagnosis not present

## 2017-09-10 DIAGNOSIS — Z7982 Long term (current) use of aspirin: Secondary | ICD-10-CM | POA: Diagnosis not present

## 2017-09-10 DIAGNOSIS — E559 Vitamin D deficiency, unspecified: Secondary | ICD-10-CM | POA: Diagnosis not present

## 2017-09-10 DIAGNOSIS — J449 Chronic obstructive pulmonary disease, unspecified: Secondary | ICD-10-CM | POA: Diagnosis not present

## 2017-09-11 DIAGNOSIS — J189 Pneumonia, unspecified organism: Secondary | ICD-10-CM | POA: Diagnosis not present

## 2017-09-11 DIAGNOSIS — I5032 Chronic diastolic (congestive) heart failure: Secondary | ICD-10-CM | POA: Diagnosis not present

## 2017-09-11 DIAGNOSIS — N183 Chronic kidney disease, stage 3 (moderate): Secondary | ICD-10-CM | POA: Diagnosis not present

## 2017-09-11 DIAGNOSIS — E039 Hypothyroidism, unspecified: Secondary | ICD-10-CM | POA: Diagnosis not present

## 2017-09-11 DIAGNOSIS — D638 Anemia in other chronic diseases classified elsewhere: Secondary | ICD-10-CM | POA: Diagnosis not present

## 2017-09-14 DIAGNOSIS — Z7982 Long term (current) use of aspirin: Secondary | ICD-10-CM | POA: Diagnosis not present

## 2017-09-14 DIAGNOSIS — M109 Gout, unspecified: Secondary | ICD-10-CM | POA: Diagnosis not present

## 2017-09-14 DIAGNOSIS — E559 Vitamin D deficiency, unspecified: Secondary | ICD-10-CM | POA: Diagnosis not present

## 2017-09-14 DIAGNOSIS — J449 Chronic obstructive pulmonary disease, unspecified: Secondary | ICD-10-CM | POA: Diagnosis not present

## 2017-09-14 DIAGNOSIS — E785 Hyperlipidemia, unspecified: Secondary | ICD-10-CM | POA: Diagnosis not present

## 2017-09-14 DIAGNOSIS — I130 Hypertensive heart and chronic kidney disease with heart failure and stage 1 through stage 4 chronic kidney disease, or unspecified chronic kidney disease: Secondary | ICD-10-CM | POA: Diagnosis not present

## 2017-09-14 DIAGNOSIS — E039 Hypothyroidism, unspecified: Secondary | ICD-10-CM | POA: Diagnosis not present

## 2017-09-14 DIAGNOSIS — Z9181 History of falling: Secondary | ICD-10-CM | POA: Diagnosis not present

## 2017-09-14 DIAGNOSIS — N183 Chronic kidney disease, stage 3 (moderate): Secondary | ICD-10-CM | POA: Diagnosis not present

## 2017-09-14 DIAGNOSIS — D631 Anemia in chronic kidney disease: Secondary | ICD-10-CM | POA: Diagnosis not present

## 2017-09-14 DIAGNOSIS — Z9981 Dependence on supplemental oxygen: Secondary | ICD-10-CM | POA: Diagnosis not present

## 2017-09-14 DIAGNOSIS — I509 Heart failure, unspecified: Secondary | ICD-10-CM | POA: Diagnosis not present

## 2017-09-14 DIAGNOSIS — I350 Nonrheumatic aortic (valve) stenosis: Secondary | ICD-10-CM | POA: Diagnosis not present

## 2017-09-16 DIAGNOSIS — I129 Hypertensive chronic kidney disease with stage 1 through stage 4 chronic kidney disease, or unspecified chronic kidney disease: Secondary | ICD-10-CM | POA: Diagnosis not present

## 2017-09-16 DIAGNOSIS — Z7982 Long term (current) use of aspirin: Secondary | ICD-10-CM | POA: Diagnosis not present

## 2017-09-16 DIAGNOSIS — E559 Vitamin D deficiency, unspecified: Secondary | ICD-10-CM | POA: Diagnosis not present

## 2017-09-16 DIAGNOSIS — M109 Gout, unspecified: Secondary | ICD-10-CM | POA: Diagnosis not present

## 2017-09-16 DIAGNOSIS — J449 Chronic obstructive pulmonary disease, unspecified: Secondary | ICD-10-CM | POA: Diagnosis not present

## 2017-09-16 DIAGNOSIS — D631 Anemia in chronic kidney disease: Secondary | ICD-10-CM | POA: Diagnosis not present

## 2017-09-16 DIAGNOSIS — Z9981 Dependence on supplemental oxygen: Secondary | ICD-10-CM | POA: Diagnosis not present

## 2017-09-16 DIAGNOSIS — E039 Hypothyroidism, unspecified: Secondary | ICD-10-CM | POA: Diagnosis not present

## 2017-09-16 DIAGNOSIS — I130 Hypertensive heart and chronic kidney disease with heart failure and stage 1 through stage 4 chronic kidney disease, or unspecified chronic kidney disease: Secondary | ICD-10-CM | POA: Diagnosis not present

## 2017-09-16 DIAGNOSIS — R809 Proteinuria, unspecified: Secondary | ICD-10-CM | POA: Diagnosis not present

## 2017-09-16 DIAGNOSIS — E785 Hyperlipidemia, unspecified: Secondary | ICD-10-CM | POA: Diagnosis not present

## 2017-09-16 DIAGNOSIS — I350 Nonrheumatic aortic (valve) stenosis: Secondary | ICD-10-CM | POA: Diagnosis not present

## 2017-09-16 DIAGNOSIS — Z9181 History of falling: Secondary | ICD-10-CM | POA: Diagnosis not present

## 2017-09-16 DIAGNOSIS — Z79899 Other long term (current) drug therapy: Secondary | ICD-10-CM | POA: Diagnosis not present

## 2017-09-16 DIAGNOSIS — I509 Heart failure, unspecified: Secondary | ICD-10-CM | POA: Diagnosis not present

## 2017-09-16 DIAGNOSIS — N183 Chronic kidney disease, stage 3 (moderate): Secondary | ICD-10-CM | POA: Diagnosis not present

## 2017-09-16 DIAGNOSIS — D509 Iron deficiency anemia, unspecified: Secondary | ICD-10-CM | POA: Diagnosis not present

## 2017-09-22 DIAGNOSIS — D508 Other iron deficiency anemias: Secondary | ICD-10-CM | POA: Diagnosis not present

## 2017-09-22 DIAGNOSIS — R809 Proteinuria, unspecified: Secondary | ICD-10-CM | POA: Diagnosis not present

## 2017-09-22 DIAGNOSIS — I509 Heart failure, unspecified: Secondary | ICD-10-CM | POA: Diagnosis not present

## 2017-09-22 DIAGNOSIS — N184 Chronic kidney disease, stage 4 (severe): Secondary | ICD-10-CM | POA: Diagnosis not present

## 2017-09-22 DIAGNOSIS — I10 Essential (primary) hypertension: Secondary | ICD-10-CM | POA: Diagnosis not present

## 2017-09-23 DIAGNOSIS — M109 Gout, unspecified: Secondary | ICD-10-CM | POA: Diagnosis not present

## 2017-09-23 DIAGNOSIS — I130 Hypertensive heart and chronic kidney disease with heart failure and stage 1 through stage 4 chronic kidney disease, or unspecified chronic kidney disease: Secondary | ICD-10-CM | POA: Diagnosis not present

## 2017-09-23 DIAGNOSIS — Z9981 Dependence on supplemental oxygen: Secondary | ICD-10-CM | POA: Diagnosis not present

## 2017-09-23 DIAGNOSIS — J449 Chronic obstructive pulmonary disease, unspecified: Secondary | ICD-10-CM | POA: Diagnosis not present

## 2017-09-23 DIAGNOSIS — I509 Heart failure, unspecified: Secondary | ICD-10-CM | POA: Diagnosis not present

## 2017-09-23 DIAGNOSIS — N183 Chronic kidney disease, stage 3 (moderate): Secondary | ICD-10-CM | POA: Diagnosis not present

## 2017-09-23 DIAGNOSIS — E559 Vitamin D deficiency, unspecified: Secondary | ICD-10-CM | POA: Diagnosis not present

## 2017-09-23 DIAGNOSIS — E785 Hyperlipidemia, unspecified: Secondary | ICD-10-CM | POA: Diagnosis not present

## 2017-09-23 DIAGNOSIS — Z7982 Long term (current) use of aspirin: Secondary | ICD-10-CM | POA: Diagnosis not present

## 2017-09-23 DIAGNOSIS — D631 Anemia in chronic kidney disease: Secondary | ICD-10-CM | POA: Diagnosis not present

## 2017-09-23 DIAGNOSIS — Z9181 History of falling: Secondary | ICD-10-CM | POA: Diagnosis not present

## 2017-09-23 DIAGNOSIS — I350 Nonrheumatic aortic (valve) stenosis: Secondary | ICD-10-CM | POA: Diagnosis not present

## 2017-09-23 DIAGNOSIS — E039 Hypothyroidism, unspecified: Secondary | ICD-10-CM | POA: Diagnosis not present

## 2017-09-25 DIAGNOSIS — E782 Mixed hyperlipidemia: Secondary | ICD-10-CM | POA: Diagnosis not present

## 2017-09-29 DIAGNOSIS — I350 Nonrheumatic aortic (valve) stenosis: Secondary | ICD-10-CM | POA: Diagnosis not present

## 2017-09-29 DIAGNOSIS — E039 Hypothyroidism, unspecified: Secondary | ICD-10-CM | POA: Diagnosis not present

## 2017-09-29 DIAGNOSIS — E559 Vitamin D deficiency, unspecified: Secondary | ICD-10-CM | POA: Diagnosis not present

## 2017-09-29 DIAGNOSIS — D509 Iron deficiency anemia, unspecified: Secondary | ICD-10-CM | POA: Diagnosis not present

## 2017-09-29 DIAGNOSIS — I509 Heart failure, unspecified: Secondary | ICD-10-CM | POA: Diagnosis not present

## 2017-09-29 DIAGNOSIS — I130 Hypertensive heart and chronic kidney disease with heart failure and stage 1 through stage 4 chronic kidney disease, or unspecified chronic kidney disease: Secondary | ICD-10-CM | POA: Diagnosis not present

## 2017-09-29 DIAGNOSIS — M109 Gout, unspecified: Secondary | ICD-10-CM | POA: Diagnosis not present

## 2017-09-29 DIAGNOSIS — Z9181 History of falling: Secondary | ICD-10-CM | POA: Diagnosis not present

## 2017-09-29 DIAGNOSIS — Z9981 Dependence on supplemental oxygen: Secondary | ICD-10-CM | POA: Diagnosis not present

## 2017-09-29 DIAGNOSIS — D631 Anemia in chronic kidney disease: Secondary | ICD-10-CM | POA: Diagnosis not present

## 2017-09-29 DIAGNOSIS — E785 Hyperlipidemia, unspecified: Secondary | ICD-10-CM | POA: Diagnosis not present

## 2017-09-29 DIAGNOSIS — N183 Chronic kidney disease, stage 3 (moderate): Secondary | ICD-10-CM | POA: Diagnosis not present

## 2017-09-29 DIAGNOSIS — Z7982 Long term (current) use of aspirin: Secondary | ICD-10-CM | POA: Diagnosis not present

## 2017-09-29 DIAGNOSIS — J449 Chronic obstructive pulmonary disease, unspecified: Secondary | ICD-10-CM | POA: Diagnosis not present

## 2017-09-30 DIAGNOSIS — I10 Essential (primary) hypertension: Secondary | ICD-10-CM | POA: Diagnosis not present

## 2017-09-30 DIAGNOSIS — J189 Pneumonia, unspecified organism: Secondary | ICD-10-CM | POA: Diagnosis not present

## 2017-09-30 DIAGNOSIS — D638 Anemia in other chronic diseases classified elsewhere: Secondary | ICD-10-CM | POA: Diagnosis not present

## 2017-09-30 DIAGNOSIS — N183 Chronic kidney disease, stage 3 (moderate): Secondary | ICD-10-CM | POA: Diagnosis not present

## 2017-09-30 DIAGNOSIS — I5032 Chronic diastolic (congestive) heart failure: Secondary | ICD-10-CM | POA: Diagnosis not present

## 2017-10-04 DIAGNOSIS — I5032 Chronic diastolic (congestive) heart failure: Secondary | ICD-10-CM | POA: Diagnosis not present

## 2017-10-06 DIAGNOSIS — Z880 Allergy status to penicillin: Secondary | ICD-10-CM | POA: Diagnosis not present

## 2017-10-06 DIAGNOSIS — D509 Iron deficiency anemia, unspecified: Secondary | ICD-10-CM | POA: Diagnosis not present

## 2017-10-06 DIAGNOSIS — Z888 Allergy status to other drugs, medicaments and biological substances status: Secondary | ICD-10-CM | POA: Diagnosis not present

## 2017-10-06 DIAGNOSIS — Z882 Allergy status to sulfonamides status: Secondary | ICD-10-CM | POA: Diagnosis not present

## 2017-10-07 ENCOUNTER — Ambulatory Visit (HOSPITAL_COMMUNITY)
Admission: RE | Admit: 2017-10-07 | Discharge: 2017-10-07 | Disposition: A | Discharge status: Home / Self Care | Payer: Medicare Other | Source: Ambulatory Visit | Attending: Internal Medicine | Admitting: Internal Medicine

## 2017-10-07 ENCOUNTER — Other Ambulatory Visit (HOSPITAL_COMMUNITY): Payer: Self-pay | Admitting: Internal Medicine

## 2017-10-07 ENCOUNTER — Other Ambulatory Visit (HOSPITAL_BASED_OUTPATIENT_CLINIC_OR_DEPARTMENT_OTHER): Payer: Self-pay

## 2017-10-07 DIAGNOSIS — J129 Viral pneumonia, unspecified: Secondary | ICD-10-CM | POA: Diagnosis not present

## 2017-10-07 DIAGNOSIS — J90 Pleural effusion, not elsewhere classified: Secondary | ICD-10-CM | POA: Insufficient documentation

## 2017-10-07 DIAGNOSIS — J189 Pneumonia, unspecified organism: Secondary | ICD-10-CM | POA: Diagnosis not present

## 2017-10-07 DIAGNOSIS — G4730 Sleep apnea, unspecified: Secondary | ICD-10-CM

## 2017-10-12 ENCOUNTER — Ambulatory Visit: Payer: Medicare Other | Attending: Internal Medicine | Admitting: Neurology

## 2017-10-12 DIAGNOSIS — G4730 Sleep apnea, unspecified: Secondary | ICD-10-CM | POA: Insufficient documentation

## 2017-10-12 DIAGNOSIS — G4733 Obstructive sleep apnea (adult) (pediatric): Secondary | ICD-10-CM | POA: Diagnosis not present

## 2017-10-16 NOTE — Procedures (Signed)
[place] A. Merlene Laughter, MD     [email or web address]             NOCTURNAL POLYSOMNOGRAPHY   LOCATION: [facility]  Patient Name: Kathleen Vance, Kathleen Vance Date: 10/12/2017 Gender: Female D.O.B: 1950/08/17 Age (years): 66 Referring Provider: Delphina Cahill Height (inches): 65 Interpreting Physician: Phillips Odor MD, ABSM Weight (lbs): 225 RPSGT: Rosebud Poles BMI: 37 MRN: [id number] Neck Size: 17.00 CLINICAL INFORMATION Sleep Study Type: Split Night CPAP  Indication for sleep study: N/A  Epworth Sleepiness Score: 7  SLEEP STUDY TECHNIQUE As per the AASM Manual for the Scoring of Sleep and Associated Events v2.3 (April 2016) with a hypopnea requiring 4% desaturations.  The channels recorded and monitored were frontal, central and occipital EEG, electrooculogram (EOG), submentalis EMG (chin), nasal and oral airflow, thoracic and abdominal wall motion, anterior tibialis EMG, snore microphone, electrocardiogram, and pulse oximetry. Continuous positive airway pressure (CPAP) was initiated when the patient met split night criteria and was titrated according to treat sleep-disordered breathing.  MEDICATIONS Medications self-administered by patient taken the night of the study : N/A  Current Outpatient Medications:  .  aspirin 81 MG tablet, Take 81 mg by mouth daily., Disp: , Rfl:  .  fenofibrate (LOFIBRA) 160 MG tablet, Take 1 tablet (160 mg total) daily by mouth., Disp: , Rfl:  .  furosemide (LASIX) 40 MG tablet, Take 1 tablet (40 mg total) by mouth daily., Disp: 30 tablet, Rfl: 0 .  gabapentin (NEURONTIN) 300 MG capsule, Take 300 mg by mouth 2 (two) times daily. , Disp: , Rfl:  .  hydrALAZINE (APRESOLINE) 10 MG tablet, Take 1 tablet (10 mg total) by mouth every 8 (eight) hours., Disp: 90 tablet, Rfl: 0 .  labetalol (NORMODYNE) 200 MG tablet, Take 1 tablet (200 mg total) by mouth 2 (two) times daily., Disp: 60 tablet, Rfl: 0 .  levothyroxine (SYNTHROID, LEVOTHROID)  100 MCG tablet, Take 100 mcg by mouth daily before breakfast., Disp: , Rfl:  .  oxyCODONE-acetaminophen (PERCOCET) 5-325 MG tablet, Take 1 tablet by mouth every 6 (six) hours as needed for severe pain., Disp: 10 tablet, Rfl: 0 .  pravastatin (PRAVACHOL) 20 MG tablet, Take 20 mg by mouth daily., Disp: , Rfl:  .  Vitamin D, Ergocalciferol, (DRISDOL) 50000 units CAPS capsule, Take 50,000 Units every 7 (seven) days by mouth., Disp: , Rfl:    RESPIRATORY PARAMETERS Diagnostic  Total AHI (/hr): 35.0 RDI (/hr): 38.0 OA Index (/hr): 0.5 CA Index (/hr): 0.5 REM AHI (/hr): N/A NREM AHI (/hr): 35.0 Supine AHI (/hr): N/A Non-supine AHI (/hr): 35.01 Min O2 Sat (%): 86.0 Mean O2 (%): 92.8 Time below 88% (min): 0.4   Titration  Optimal Pressure (cm): 10 AHI at Optimal Pressure (/hr): 2.7 Min O2 at Optimal Pressure (%): 90.0 Supine % at Optimal (%): 0 Sleep % at Optimal (%): 96   SLEEP ARCHITECTURE The recording time for the entire night was 414.8 minutes.  During a baseline period of 152.9 minutes, the patient slept for 121.7 minutes in REM and nonREM, yielding a sleep efficiency of 79.6%%. Sleep onset after lights out was 25.7 minutes with a REM latency of N/A minutes. The patient spent 2.1%% of the night in stage N1 sleep, 69.2%% in stage N2 sleep, 28.8%% in stage N3 and 0.0%% in REM.  During the titration period of 253.9 minutes, the patient slept for 202.6 minutes in REM and nonREM, yielding a sleep efficiency of 79.8%%. Sleep onset after CPAP initiation was 30.8 minutes with a  REM latency of 103.0 minutes. The patient spent 6.4%% of the night in stage N1 sleep, 39.5%% in stage N2 sleep, 35.3%% in stage N3 and 18.8%% in REM.  CARDIAC DATA The 2 lead EKG demonstrated sinus rhythm. The mean heart rate was 100.0 beats per minute. Other EKG findings include: PVCs. LEG MOVEMENT DATA The total Periodic Limb Movements of Sleep (PLMS) were 0. The PLMS index was 0.0.  IMPRESSIONS Severe obstructive sleep  apnea occurred during the diagnostic portion of the study (AHI = 35.0/hour). The optimal CPAP selected for this patient is ( 10 cm of water)    Delano Metz, MD Diplomate, American Board of Sleep Medicine.  ELECTRONICALLY SIGNED ON:  10/16/2017, 9:32 AM [facility] PH: [phone]   FX: [phone] [place]

## 2017-10-19 DIAGNOSIS — I10 Essential (primary) hypertension: Secondary | ICD-10-CM | POA: Diagnosis not present

## 2017-10-19 DIAGNOSIS — N184 Chronic kidney disease, stage 4 (severe): Secondary | ICD-10-CM | POA: Diagnosis not present

## 2017-10-19 DIAGNOSIS — I5032 Chronic diastolic (congestive) heart failure: Secondary | ICD-10-CM | POA: Diagnosis not present

## 2017-11-02 DIAGNOSIS — I5032 Chronic diastolic (congestive) heart failure: Secondary | ICD-10-CM | POA: Diagnosis not present

## 2017-11-02 DIAGNOSIS — I10 Essential (primary) hypertension: Secondary | ICD-10-CM | POA: Diagnosis not present

## 2017-11-02 DIAGNOSIS — G4733 Obstructive sleep apnea (adult) (pediatric): Secondary | ICD-10-CM | POA: Diagnosis not present

## 2017-12-04 DIAGNOSIS — D509 Iron deficiency anemia, unspecified: Secondary | ICD-10-CM | POA: Diagnosis not present

## 2017-12-04 DIAGNOSIS — R809 Proteinuria, unspecified: Secondary | ICD-10-CM | POA: Diagnosis not present

## 2017-12-04 DIAGNOSIS — E559 Vitamin D deficiency, unspecified: Secondary | ICD-10-CM | POA: Diagnosis not present

## 2017-12-04 DIAGNOSIS — Z79899 Other long term (current) drug therapy: Secondary | ICD-10-CM | POA: Diagnosis not present

## 2017-12-04 DIAGNOSIS — N183 Chronic kidney disease, stage 3 (moderate): Secondary | ICD-10-CM | POA: Diagnosis not present

## 2017-12-04 DIAGNOSIS — I129 Hypertensive chronic kidney disease with stage 1 through stage 4 chronic kidney disease, or unspecified chronic kidney disease: Secondary | ICD-10-CM | POA: Diagnosis not present

## 2017-12-08 DIAGNOSIS — N184 Chronic kidney disease, stage 4 (severe): Secondary | ICD-10-CM | POA: Diagnosis not present

## 2017-12-08 DIAGNOSIS — I10 Essential (primary) hypertension: Secondary | ICD-10-CM | POA: Diagnosis not present

## 2017-12-08 DIAGNOSIS — I701 Atherosclerosis of renal artery: Secondary | ICD-10-CM | POA: Diagnosis not present

## 2017-12-08 DIAGNOSIS — I5030 Unspecified diastolic (congestive) heart failure: Secondary | ICD-10-CM | POA: Diagnosis not present

## 2017-12-28 DIAGNOSIS — E782 Mixed hyperlipidemia: Secondary | ICD-10-CM | POA: Diagnosis not present

## 2017-12-28 DIAGNOSIS — E039 Hypothyroidism, unspecified: Secondary | ICD-10-CM | POA: Diagnosis not present

## 2017-12-28 DIAGNOSIS — M1000 Idiopathic gout, unspecified site: Secondary | ICD-10-CM | POA: Diagnosis not present

## 2017-12-28 DIAGNOSIS — D72829 Elevated white blood cell count, unspecified: Secondary | ICD-10-CM | POA: Diagnosis not present

## 2017-12-28 DIAGNOSIS — N184 Chronic kidney disease, stage 4 (severe): Secondary | ICD-10-CM | POA: Diagnosis not present

## 2017-12-31 DIAGNOSIS — N184 Chronic kidney disease, stage 4 (severe): Secondary | ICD-10-CM | POA: Diagnosis not present

## 2017-12-31 DIAGNOSIS — E039 Hypothyroidism, unspecified: Secondary | ICD-10-CM | POA: Diagnosis not present

## 2017-12-31 DIAGNOSIS — I5032 Chronic diastolic (congestive) heart failure: Secondary | ICD-10-CM | POA: Diagnosis not present

## 2017-12-31 DIAGNOSIS — I129 Hypertensive chronic kidney disease with stage 1 through stage 4 chronic kidney disease, or unspecified chronic kidney disease: Secondary | ICD-10-CM | POA: Diagnosis not present

## 2017-12-31 DIAGNOSIS — D631 Anemia in chronic kidney disease: Secondary | ICD-10-CM | POA: Diagnosis not present

## 2018-02-26 DIAGNOSIS — E782 Mixed hyperlipidemia: Secondary | ICD-10-CM | POA: Diagnosis not present

## 2018-02-26 DIAGNOSIS — I10 Essential (primary) hypertension: Secondary | ICD-10-CM | POA: Diagnosis not present

## 2018-02-26 DIAGNOSIS — E039 Hypothyroidism, unspecified: Secondary | ICD-10-CM | POA: Diagnosis not present

## 2018-03-02 DIAGNOSIS — Z79899 Other long term (current) drug therapy: Secondary | ICD-10-CM | POA: Diagnosis not present

## 2018-03-02 DIAGNOSIS — E559 Vitamin D deficiency, unspecified: Secondary | ICD-10-CM | POA: Diagnosis not present

## 2018-03-02 DIAGNOSIS — D509 Iron deficiency anemia, unspecified: Secondary | ICD-10-CM | POA: Diagnosis not present

## 2018-03-02 DIAGNOSIS — I129 Hypertensive chronic kidney disease with stage 1 through stage 4 chronic kidney disease, or unspecified chronic kidney disease: Secondary | ICD-10-CM | POA: Diagnosis not present

## 2018-03-02 DIAGNOSIS — N183 Chronic kidney disease, stage 3 (moderate): Secondary | ICD-10-CM | POA: Diagnosis not present

## 2018-03-02 DIAGNOSIS — R809 Proteinuria, unspecified: Secondary | ICD-10-CM | POA: Diagnosis not present

## 2018-03-09 DIAGNOSIS — M109 Gout, unspecified: Secondary | ICD-10-CM | POA: Diagnosis not present

## 2018-03-09 DIAGNOSIS — N184 Chronic kidney disease, stage 4 (severe): Secondary | ICD-10-CM | POA: Diagnosis not present

## 2018-03-09 DIAGNOSIS — I10 Essential (primary) hypertension: Secondary | ICD-10-CM | POA: Diagnosis not present

## 2018-03-09 DIAGNOSIS — I701 Atherosclerosis of renal artery: Secondary | ICD-10-CM | POA: Diagnosis not present

## 2018-03-09 DIAGNOSIS — R809 Proteinuria, unspecified: Secondary | ICD-10-CM | POA: Diagnosis not present

## 2018-03-09 DIAGNOSIS — N183 Chronic kidney disease, stage 3 (moderate): Secondary | ICD-10-CM | POA: Diagnosis not present

## 2018-03-09 DIAGNOSIS — I509 Heart failure, unspecified: Secondary | ICD-10-CM | POA: Diagnosis not present

## 2018-04-12 DIAGNOSIS — H43813 Vitreous degeneration, bilateral: Secondary | ICD-10-CM | POA: Diagnosis not present

## 2018-04-12 DIAGNOSIS — Z961 Presence of intraocular lens: Secondary | ICD-10-CM | POA: Diagnosis not present

## 2018-04-12 DIAGNOSIS — H16223 Keratoconjunctivitis sicca, not specified as Sjogren's, bilateral: Secondary | ICD-10-CM | POA: Diagnosis not present

## 2018-04-12 DIAGNOSIS — E119 Type 2 diabetes mellitus without complications: Secondary | ICD-10-CM | POA: Diagnosis not present

## 2018-05-06 DIAGNOSIS — E039 Hypothyroidism, unspecified: Secondary | ICD-10-CM | POA: Diagnosis not present

## 2018-05-06 DIAGNOSIS — I10 Essential (primary) hypertension: Secondary | ICD-10-CM | POA: Diagnosis not present

## 2018-05-06 DIAGNOSIS — D638 Anemia in other chronic diseases classified elsewhere: Secondary | ICD-10-CM | POA: Diagnosis not present

## 2018-05-06 DIAGNOSIS — E782 Mixed hyperlipidemia: Secondary | ICD-10-CM | POA: Diagnosis not present

## 2018-05-06 DIAGNOSIS — N184 Chronic kidney disease, stage 4 (severe): Secondary | ICD-10-CM | POA: Diagnosis not present

## 2018-05-12 ENCOUNTER — Other Ambulatory Visit (HOSPITAL_COMMUNITY): Payer: Self-pay | Admitting: Internal Medicine

## 2018-05-12 DIAGNOSIS — N184 Chronic kidney disease, stage 4 (severe): Secondary | ICD-10-CM | POA: Diagnosis not present

## 2018-05-12 DIAGNOSIS — E782 Mixed hyperlipidemia: Secondary | ICD-10-CM | POA: Diagnosis not present

## 2018-05-12 DIAGNOSIS — R160 Hepatomegaly, not elsewhere classified: Secondary | ICD-10-CM

## 2018-05-12 DIAGNOSIS — I10 Essential (primary) hypertension: Secondary | ICD-10-CM | POA: Diagnosis not present

## 2018-05-12 DIAGNOSIS — I5032 Chronic diastolic (congestive) heart failure: Secondary | ICD-10-CM | POA: Diagnosis not present

## 2018-05-12 DIAGNOSIS — R9389 Abnormal findings on diagnostic imaging of other specified body structures: Secondary | ICD-10-CM

## 2018-05-12 DIAGNOSIS — R932 Abnormal findings on diagnostic imaging of liver and biliary tract: Secondary | ICD-10-CM

## 2018-05-12 DIAGNOSIS — R1011 Right upper quadrant pain: Secondary | ICD-10-CM

## 2018-05-26 DIAGNOSIS — J189 Pneumonia, unspecified organism: Secondary | ICD-10-CM | POA: Diagnosis not present

## 2018-05-26 DIAGNOSIS — R05 Cough: Secondary | ICD-10-CM | POA: Diagnosis not present

## 2018-05-28 ENCOUNTER — Other Ambulatory Visit (HOSPITAL_COMMUNITY): Payer: Self-pay | Admitting: Adult Health Nurse Practitioner

## 2018-05-28 ENCOUNTER — Ambulatory Visit (HOSPITAL_COMMUNITY)
Admission: RE | Admit: 2018-05-28 | Discharge: 2018-05-28 | Disposition: A | Discharge status: Home / Self Care | Payer: Medicare Other | Source: Ambulatory Visit | Attending: Adult Health Nurse Practitioner | Admitting: Adult Health Nurse Practitioner

## 2018-05-28 DIAGNOSIS — J189 Pneumonia, unspecified organism: Secondary | ICD-10-CM | POA: Insufficient documentation

## 2018-05-28 DIAGNOSIS — J810 Acute pulmonary edema: Secondary | ICD-10-CM | POA: Diagnosis not present

## 2018-05-28 DIAGNOSIS — R05 Cough: Secondary | ICD-10-CM | POA: Diagnosis not present

## 2018-05-28 DIAGNOSIS — R0602 Shortness of breath: Secondary | ICD-10-CM | POA: Diagnosis not present

## 2018-05-31 DIAGNOSIS — I5033 Acute on chronic diastolic (congestive) heart failure: Secondary | ICD-10-CM | POA: Diagnosis not present

## 2018-05-31 DIAGNOSIS — N184 Chronic kidney disease, stage 4 (severe): Secondary | ICD-10-CM | POA: Diagnosis not present

## 2018-05-31 DIAGNOSIS — R932 Abnormal findings on diagnostic imaging of liver and biliary tract: Secondary | ICD-10-CM | POA: Diagnosis not present

## 2018-05-31 DIAGNOSIS — E782 Mixed hyperlipidemia: Secondary | ICD-10-CM | POA: Diagnosis not present

## 2018-05-31 DIAGNOSIS — M1000 Idiopathic gout, unspecified site: Secondary | ICD-10-CM | POA: Diagnosis not present

## 2018-05-31 DIAGNOSIS — E039 Hypothyroidism, unspecified: Secondary | ICD-10-CM | POA: Diagnosis not present

## 2018-07-03 ENCOUNTER — Emergency Department (HOSPITAL_COMMUNITY): Payer: Medicare Other

## 2018-07-03 ENCOUNTER — Encounter (HOSPITAL_COMMUNITY): Payer: Self-pay | Admitting: Emergency Medicine

## 2018-07-03 ENCOUNTER — Emergency Department (HOSPITAL_COMMUNITY)
Admission: EM | Admit: 2018-07-03 | Discharge: 2018-07-03 | Disposition: A | Discharge status: Home / Self Care | Payer: Medicare Other | Attending: Emergency Medicine | Admitting: Emergency Medicine

## 2018-07-03 ENCOUNTER — Other Ambulatory Visit: Payer: Self-pay

## 2018-07-03 DIAGNOSIS — N200 Calculus of kidney: Secondary | ICD-10-CM | POA: Insufficient documentation

## 2018-07-03 DIAGNOSIS — M545 Low back pain, unspecified: Secondary | ICD-10-CM

## 2018-07-03 DIAGNOSIS — Z79899 Other long term (current) drug therapy: Secondary | ICD-10-CM | POA: Diagnosis not present

## 2018-07-03 DIAGNOSIS — I10 Essential (primary) hypertension: Secondary | ICD-10-CM | POA: Diagnosis not present

## 2018-07-03 DIAGNOSIS — E039 Hypothyroidism, unspecified: Secondary | ICD-10-CM | POA: Diagnosis not present

## 2018-07-03 DIAGNOSIS — K5790 Diverticulosis of intestine, part unspecified, without perforation or abscess without bleeding: Secondary | ICD-10-CM | POA: Diagnosis not present

## 2018-07-03 DIAGNOSIS — R1032 Left lower quadrant pain: Secondary | ICD-10-CM | POA: Diagnosis present

## 2018-07-03 DIAGNOSIS — N201 Calculus of ureter: Secondary | ICD-10-CM | POA: Diagnosis not present

## 2018-07-03 DIAGNOSIS — R109 Unspecified abdominal pain: Secondary | ICD-10-CM | POA: Diagnosis not present

## 2018-07-03 HISTORY — DX: Unspecified kidney failure: N19

## 2018-07-03 LAB — URINALYSIS, ROUTINE W REFLEX MICROSCOPIC
Bacteria, UA: NONE SEEN
Bilirubin Urine: NEGATIVE
GLUCOSE, UA: NEGATIVE mg/dL
HGB URINE DIPSTICK: NEGATIVE
Ketones, ur: NEGATIVE mg/dL
LEUKOCYTE UA: NEGATIVE
NITRITE: NEGATIVE
Protein, ur: 100 mg/dL — AB
Specific Gravity, Urine: 1.013 (ref 1.005–1.030)
pH: 5 (ref 5.0–8.0)

## 2018-07-03 LAB — CBC WITH DIFFERENTIAL/PLATELET
ABS IMMATURE GRANULOCYTES: 0.06 10*3/uL (ref 0.00–0.07)
BASOS PCT: 0 %
Basophils Absolute: 0 10*3/uL (ref 0.0–0.1)
Eosinophils Absolute: 0.3 10*3/uL (ref 0.0–0.5)
Eosinophils Relative: 3 %
HCT: 37.8 % (ref 36.0–46.0)
Hemoglobin: 11.5 g/dL — ABNORMAL LOW (ref 12.0–15.0)
Immature Granulocytes: 1 %
Lymphocytes Relative: 16 %
Lymphs Abs: 1.6 10*3/uL (ref 0.7–4.0)
MCH: 29.6 pg (ref 26.0–34.0)
MCHC: 30.4 g/dL (ref 30.0–36.0)
MCV: 97.2 fL (ref 80.0–100.0)
Monocytes Absolute: 0.7 10*3/uL (ref 0.1–1.0)
Monocytes Relative: 7 %
NEUTROS ABS: 7.3 10*3/uL (ref 1.7–7.7)
NEUTROS PCT: 73 %
PLATELETS: 270 10*3/uL (ref 150–400)
RBC: 3.89 MIL/uL (ref 3.87–5.11)
RDW: 15.3 % (ref 11.5–15.5)
WBC: 10.1 10*3/uL (ref 4.0–10.5)
nRBC: 0 % (ref 0.0–0.2)

## 2018-07-03 LAB — COMPREHENSIVE METABOLIC PANEL
ALT: 13 U/L (ref 0–44)
AST: 15 U/L (ref 15–41)
Albumin: 3.7 g/dL (ref 3.5–5.0)
Alkaline Phosphatase: 53 U/L (ref 38–126)
Anion gap: 9 (ref 5–15)
BUN: 30 mg/dL — AB (ref 8–23)
CHLORIDE: 105 mmol/L (ref 98–111)
CO2: 25 mmol/L (ref 22–32)
Calcium: 8.8 mg/dL — ABNORMAL LOW (ref 8.9–10.3)
Creatinine, Ser: 1.59 mg/dL — ABNORMAL HIGH (ref 0.44–1.00)
GFR calc Af Amer: 38 mL/min — ABNORMAL LOW (ref >60)
GFR, EST NON AFRICAN AMERICAN: 33 mL/min — AB (ref >60)
Glucose, Bld: 100 mg/dL — ABNORMAL HIGH (ref 70–99)
Potassium: 4 mmol/L (ref 3.5–5.1)
Sodium: 139 mmol/L (ref 135–145)
Total Bilirubin: 0.5 mg/dL (ref 0.3–1.2)
Total Protein: 7.1 g/dL (ref 6.5–8.1)

## 2018-07-03 LAB — LIPASE, BLOOD: Lipase: 72 U/L — ABNORMAL HIGH (ref 11–51)

## 2018-07-03 LAB — TROPONIN I: Troponin I: <0.03 ng/mL (ref <0.03)

## 2018-07-03 MED ORDER — HYDROMORPHONE HCL 1 MG/ML IJ SOLN
0.5000 mg | Freq: Once | INTRAMUSCULAR | Status: AC
  Administered 2018-07-03: 0.5 mg via INTRAVENOUS
  Filled 2018-07-03: qty 1

## 2018-07-03 MED ORDER — OXYCODONE-ACETAMINOPHEN 5-325 MG PO TABS: 1.0000 | ORAL_TABLET | Freq: Four times a day (QID) | ORAL | 0 refills | Status: DC | PRN

## 2018-07-03 MED ORDER — OXYCODONE-ACETAMINOPHEN 5-325 MG PO TABS
1.0000 | ORAL_TABLET | Freq: Once | ORAL | Status: AC
  Administered 2018-07-03: 1 via ORAL
  Filled 2018-07-03: qty 1

## 2018-07-03 MED ORDER — HYDRALAZINE HCL 25 MG PO TABS
50.0000 mg | ORAL_TABLET | Freq: Once | ORAL | Status: AC
  Administered 2018-07-03: 50 mg via ORAL
  Filled 2018-07-03: qty 2

## 2018-07-03 MED ORDER — HYDROMORPHONE HCL 1 MG/ML IJ SOLN
1.0000 mg | Freq: Once | INTRAMUSCULAR | Status: AC
  Administered 2018-07-03: 1 mg via INTRAVENOUS
  Filled 2018-07-03: qty 1

## 2018-07-03 MED ORDER — LOSARTAN POTASSIUM 25 MG PO TABS
25.0000 mg | ORAL_TABLET | Freq: Once | ORAL | Status: AC
  Administered 2018-07-03: 25 mg via ORAL
  Filled 2018-07-03: qty 1

## 2018-07-03 NOTE — ED Triage Notes (Signed)
Patient c/o left flank pain that started last night but is progressively getting worse. Per patient pain is starting to radiate into abd. Denies any nausea, vomiting, diarrhea, fevers, or urinary symptoms.

## 2018-07-03 NOTE — ED Provider Notes (Signed)
[facility] EMERGENCY DEPARTMENT Provider Note   CSN: [id number] Arrival date & time: 07/03/18  1502    History   Chief Complaint Chief Complaint  Patient presents with  . Flank Pain    HPI Kathleen Vance is a 68 y.o. female.     HPI Patient presents with acute onset left flank pain that radiates around her abdomen.  This started last night.  Describes the pain as shooting in nature.  She denies hematuria, dysuria, frequency or urgency.  No fever or chills.  No nausea or vomiting.  No difficulty breathing or chest pain.  Patient states she did not take her blood pressure medication last night and only took several of her blood pressure medications this morning.  She is been taking clonidine at home with minimal relief of her symptoms.  States she has never had similar pain. Past Medical History:  Diagnosis Date  . Aortic stenosis   . Arthritis   . Celiac artery stenosis ([place])   . Gout   . Hyperlipidemia   . Hypertension   . Hypothyroidism   . Kidney failure   . Prediabetes   . Renal artery stenosis [facility])     Patient Active Problem List   Diagnosis Date Noted  . Acute renal failure superimposed on stage 3 chronic kidney disease ([place]) 08/12/2017  . Lobar pneumonia ([place]) 08/12/2017  . Acidosis   . Acute on chronic respiratory failure with hypercapnia ([place])   . HCAP (healthcare-associated pneumonia) 08/05/2017  . Liver lesion 08/05/2017  . Acute renal failure superimposed on stage 2 chronic kidney disease ([place]) 08/05/2017  . Gastroesophageal reflux disease   . Class 2 obesity due to excess calories with body mass index (BMI) of 38.0 to 38.9 in adult   . Chest pain 07/22/2017  . Hypertension 07/22/2017  . Hypothyroidism 07/22/2017  . Hyperlipidemia 07/22/2017  . Gout 07/22/2017  . Prediabetes 07/22/2017  . Hypomagnesemia 07/22/2017  . Acute lower UTI 07/22/2017    Past Surgical History:  Procedure Laterality Date  . ANKLE SURGERY Bilateral   . CATARACT  EXTRACTION    . CESAREAN SECTION    . CHOLECYSTECTOMY    . PARTIAL HYSTERECTOMY    . REPAIR KNEE LIGAMENT       OB History   No obstetric history on file.      Home Medications    Prior to Admission medications   Medication Sig Start Date End Date Taking? Authorizing Provider  albuterol (PROVENTIL) (2.5 MG/3ML) 0.083% nebulizer solution Take 2.5 mg by nebulization every 4 (four) hours as needed for wheezing or shortness of breath.  05/26/18  Yes [provider]  allopurinol (ZYLOPRIM) 100 MG tablet Take 100 mg by mouth every morning.   Yes [provider]  furosemide (LASIX) 40 MG tablet Take 1 tablet (40 mg total) by mouth daily. 08/16/17  Yes Tat, Shanon Brow, MD  gabapentin (NEURONTIN) 300 MG capsule Take 300 mg by mouth 3 (three) times daily.    Yes [provider]  hydrALAZINE (APRESOLINE) 50 MG tablet Take 50 mg by mouth 3 (three) times daily.   Yes [provider]  HYDROcodone-acetaminophen (NORCO) 7.5-325 MG tablet Take 1 tablet by mouth 3 (three) times daily as needed for moderate pain.   Yes [provider]  labetalol (NORMODYNE) 200 MG tablet Take 1 tablet (200 mg total) by mouth 2 (two) times daily. 08/14/17  Yes Tat, Shanon Brow, MD  levothyroxine (SYNTHROID, LEVOTHROID) 100 MCG tablet Take 100 mcg by mouth daily  before breakfast.   Yes [provider]  losartan (COZAAR) 25 MG tablet Take 25 mg by mouth daily.   Yes [provider]  Vitamin D, Ergocalciferol, (DRISDOL) 50000 units CAPS capsule Take 50,000 Units by mouth every Monday.    Yes [provider]  ondansetron (ZOFRAN ODT) 4 MG disintegrating tablet Take 1 tablet (4 mg total) by mouth every 8 (eight) hours as needed. 07/04/18   Fredia Sorrow, MD  oxyCODONE-acetaminophen (PERCOCET) 5-325 MG tablet Take 1-2 tablets by mouth every 6 (six) hours as needed for severe pain. 07/03/18   Julianne Rice, MD    Family History Family History  Problem Relation Age of  Onset  . CAD Mother   . Heart attack Mother   . CAD Father   . Pneumonia Sister   . Other Brother        killed at age 31  . CAD Maternal Grandmother   . Heart attack Maternal Grandmother   . Diabetes Maternal Grandfather   . Other Paternal Grandmother        died after child birth  . Heart attack Paternal Grandfather     Social History Social History   Tobacco Use  . Smoking status: Never Smoker  . Smokeless tobacco: Never Used  Substance Use Topics  . Alcohol use: Yes    Comment: rarely  . Drug use: Never     Allergies   Bactrim [sulfamethoxazole-trimethoprim]; Ciprofloxacin; Nitrofurantoin; Metronidazole; Amoxicillin; and Erythromycin   Review of Systems Review of Systems  Constitutional: Negative for chills and fever.  Respiratory: Negative for cough and shortness of breath.   Cardiovascular: Negative for chest pain.  Gastrointestinal: Positive for abdominal pain. Negative for constipation, diarrhea, nausea and vomiting.  Genitourinary: Positive for flank pain. Negative for dysuria, frequency, hematuria and pelvic pain.  Musculoskeletal: Positive for back pain. Negative for neck pain and neck stiffness.  Skin: Negative for rash and wound.  Neurological: Negative for dizziness, weakness, light-headedness, numbness and headaches.  All other systems reviewed and are negative.    Physical Exam Updated Vital Signs BP (!) 174/100   Pulse (!) 56   Temp 98.3 F (36.8 C) (Oral)   Resp 14   Ht 5\' 4"  (1.626 m)   Wt 94.8 kg   SpO2 91%   BMI 35.87 kg/m   Physical Exam Vitals signs and nursing note reviewed.  Constitutional:      General: She is in acute distress.     Appearance: Normal appearance. She is well-developed. She is not ill-appearing.  HENT:     Head: Normocephalic and atraumatic.     Nose: Nose normal.     Mouth/Throat:     Mouth: Mucous membranes are moist.  Eyes:     Extraocular Movements: Extraocular movements intact.     Pupils: Pupils are  equal, round, and reactive to light.  Neck:     Musculoskeletal: Normal range of motion and neck supple. No neck rigidity or muscular tenderness.  Cardiovascular:     Rate and Rhythm: Normal rate and regular rhythm.     Heart sounds: No murmur. No friction rub. No gallop.   Pulmonary:     Effort: Pulmonary effort is normal. No respiratory distress.     Breath sounds: Normal breath sounds. No stridor. No wheezing, rhonchi or rales.  Abdominal:     General: Bowel sounds are normal. There is no distension.     Palpations: Abdomen is soft. There is no mass.     Tenderness:  There is no abdominal tenderness. There is left CVA tenderness. There is no guarding or rebound.     Comments: Patient has mild left CVA tenderness to palpation.  No midline thoracic or lumbar tenderness.  Musculoskeletal: Normal range of motion.        General: No swelling, tenderness, deformity or signs of injury.     Right lower leg: No edema.     Left lower leg: No edema.     Comments: Posterior tibial and dorsalis pedis pulses are 2+ and bilateral lower extremities.  Lymphadenopathy:     Cervical: No cervical adenopathy.  Skin:    General: Skin is warm and dry.     Capillary Refill: Capillary refill takes less than 2 seconds.     Findings: No erythema or rash.  Neurological:     General: No focal deficit present.     Mental Status: She is alert and oriented to person, place, and time.     Comments: Moves all extremities without focal deficit.  Sensation intact.  Psychiatric:        Behavior: Behavior normal.      ED Treatments / Results  Labs (all labs ordered are listed, but only abnormal results are displayed) Labs Reviewed  CBC WITH DIFFERENTIAL/PLATELET - Abnormal; Notable for the following components:      Result Value   Hemoglobin 11.5 (*)    All other components within normal limits  COMPREHENSIVE METABOLIC PANEL - Abnormal; Notable for the following components:   Glucose, Bld 100 (*)    BUN 30  (*)    Creatinine, Ser 1.59 (*)    Calcium 8.8 (*)    GFR calc non Af Amer 33 (*)    GFR calc Af Amer 38 (*)    All other components within normal limits  LIPASE, BLOOD - Abnormal; Notable for the following components:   Lipase 72 (*)    All other components within normal limits  URINALYSIS, ROUTINE W REFLEX MICROSCOPIC - Abnormal; Notable for the following components:   Protein, ur 100 (*)    All other components within normal limits  TROPONIN I    EKG EKG Interpretation  Date/Time:  Saturday July 03 2018 17:11:16 EST Ventricular Rate:  55 PR Interval:    QRS Duration: 97 QT Interval:  465 QTC Calculation: 445 R Axis:   5 Text Interpretation:  Sinus rhythm Short PR interval LVH with secondary repolarization abnormality When compared to prior, slower rate.  No STEMI Confirmed by Antony Blackbird [phone]) on 07/04/2018 1:29:38 PM   Radiology Ct Renal Stone Study  Result Date: 07/04/2018 CLINICAL DATA:  Per ED notes: C/o of right sided flank pain x 2 days. Pt was seen yesterday ans was told she has a kidney stone. States the pain meds prescribed yesterday has not helped MD aware pt was scanned <24hrs ago- still wants scan EXAM: CT ABDOMEN AND PELVIS WITHOUT CONTRAST TECHNIQUE: Multidetector CT imaging of the abdomen and pelvis was performed following the standard protocol without IV contrast. COMPARISON:  07/03/2018, 08/10/2004 FINDINGS: Lower chest: Cardiomegaly. No pleural or pericardial effusion. Hepatobiliary: Segment 7 hepatic cyst. Cholecystectomy clips. Distended CBD. No new liver lesion. Pancreas: Unremarkable. No pancreatic ductal dilatation or surrounding inflammatory changes. Spleen: Normal in size without focal abnormality. Adrenals/Urinary Tract: Bilateral nephrolithiasis, 8 mm calculus at the right UPJ without hydronephrosis. largest stone on the left 7 mm in the mid renal collecting system. Ureters decompressed distally. Urinary bladder incompletely distended. Stomach/Bowel:  Stomach is nondistended. Small bowel  decompressed. Appendix not discretely identified. Scattered diverticula predominantly in the descending and sigmoid portions, without significant adjacent inflammatory/edematous change or abscess. Vascular/Lymphatic: Scattered aortoiliac arterial plaque without aneurysm. No abdominal or pelvic adenopathy. Reproductive: Status post hysterectomy. No adnexal masses. Other: No ascites. No free air. Musculoskeletal: Small umbilical hernia containing mesenteric fat. Facet DJD in the lower lumbar spine. Negative for fracture or worrisome bone lesion. IMPRESSION: 1. Bilateral nephrolithiasis with 8 mm calculus at the right UPJ without hydronephrosis. 2. Descending and sigmoid diverticulosis. Electronically Signed   By: Lucrezia Europe M.D.   On: 07/04/2018 13:45   Ct Renal Stone Study  Result Date: 07/03/2018 CLINICAL DATA:  LEFT flank pain that started last night but is progressively getting worse. Per patient pain is starting to radiate into abd. Denies any nausea, vomiting, diarrhea, fevers, or urinary symptoms. Hypertension, renal artery stenosis, kidney failure. Celiac axis stenosis, aortic stenosis. Previous cholecystectomy, hysterectomy, C-section. EXAM: CT ABDOMEN AND PELVIS WITHOUT CONTRAST TECHNIQUE: Multidetector CT imaging of the abdomen and pelvis was performed following the standard protocol without IV contrast. COMPARISON:  CT of the abdomen and pelvis 08/10/2004 FINDINGS: Lower chest: No pulmonary nodules, pleural effusions, or infiltrates. Heart is mildly enlarged. There is coronary artery calcification, loop partially imaged. Hepatobiliary: Interval development of an 11 millimeter low-attenuation lesion within the posterior aspect of the RIGHT hepatic lobe, likely representing a cyst. Cholecystectomy. Pancreas: Unremarkable. No pancreatic ductal dilatation or surrounding inflammatory changes. Spleen: Normal in size without focal abnormality. Adrenals/Urinary Tract:  Adrenal glands are normal in appearance. There is renal parenchymal thinning bilaterally. Intrarenal stones are identified within both kidneys, largest 7 millimeters in the midpole region of the LEFT kidney. Punctate calcifications seen within the midpole region of the RIGHT kidney. There is a 6 millimeter calculus within the RIGHT ureteropelvic junction. The remainder of the RIGHT ureter and the LEFT ureter are unremarkable. The bladder and visualized portion of the urethra are normal. Stomach/Bowel: The stomach and small bowel loops are unremarkable. There are numerous colonic diverticula but no acute diverticulitis. Appendix is not seen. Vascular/Lymphatic: There is dense atherosclerotic calcification of the abdominal aorta. No aneurysm. No retroperitoneal or mesenteric adenopathy. Reproductive: Uterus is absent. No adnexal mass. Other: No free pelvic fluid. Fat containing paraumbilical hernia. Musculoskeletal: Degenerative changes are seen in the LOWER thoracic and lumbosacral spine. No suspicious lytic or blastic lesions are identified. IMPRESSION: 1. 6 millimeter calculus at the RIGHT ureteropelvic junction. 2. Bilateral intrarenal stones. Bilateral renal parenchymal thinning. 3. Colonic diverticulosis. 4. Coronary artery disease. 5. Status post hysterectomy. 6. Fat containing paraumbilical hernia. Aortic Atherosclerosis (ICD10-I70.0). Electronically Signed   By: Nolon Nations M.D.   On: 07/03/2018 17:42    Procedures Procedures (including critical care time)  Medications Ordered in ED Medications  HYDROmorphone (DILAUDID) injection 0.5 mg (0.5 mg Intravenous Given 07/03/18 1655)  HYDROmorphone (DILAUDID) injection 0.5 mg (0.5 mg Intravenous Given 07/03/18 1827)  hydrALAZINE (APRESOLINE) tablet 50 mg (50 mg Oral Given 07/03/18 1934)  losartan (COZAAR) tablet 25 mg (25 mg Oral Given 07/03/18 1934)  oxyCODONE-acetaminophen (PERCOCET/ROXICET) 5-325 MG per tablet 1 tablet (1 tablet Oral Given 07/03/18  1934)  HYDROmorphone (DILAUDID) injection 1 mg (1 mg Intravenous Given 07/03/18 2110)     Initial Impression / Assessment and Plan / ED Course  I have reviewed the triage vital signs and the nursing notes.  Pertinent labs & imaging results that were available during my care of the patient were reviewed by me and considered in my medical decision making (see  chart for details).       Review of CT angio chest which patient had performed last year.  No evidence of aneurysmal dilation.  Patient does have stone in the left kidney on previous imaging studies.    CT with right distal ureteral stone.  Further questioning patient states she developed right-sided back pain and then developed left-sided.  She states she also has chronic back pain for which she takes hydrocodone.  Clinical picture is likely confused with her chronic pain.  Patient also admits to not taking her blood pressure medication today.  Required multiple doses of pain medication and she has been given her blood pressure medication.  Blood pressures improved.  Patient's pain has also significantly improved.  No evidence of underlying infection.  Advised to follow-up with urology and strict return precautions given.  Final Clinical Impressions(s) / ED Diagnoses   Final diagnoses:  Acute left-sided low back pain without sciatica  Renal stone  Hypertension, unspecified type    ED Discharge Orders         Ordered    oxyCODONE-acetaminophen (PERCOCET) 5-325 MG tablet  Every 6 hours PRN     07/03/18 2126           Julianne Rice, MD 07/04/18 [phone]

## 2018-07-04 ENCOUNTER — Encounter (HOSPITAL_COMMUNITY): Payer: Self-pay | Admitting: Emergency Medicine

## 2018-07-04 ENCOUNTER — Emergency Department (HOSPITAL_COMMUNITY): Payer: Medicare Other

## 2018-07-04 ENCOUNTER — Emergency Department (HOSPITAL_COMMUNITY)
Admission: EM | Admit: 2018-07-04 | Discharge: 2018-07-04 | Disposition: A | Discharge status: Home / Self Care | Payer: Medicare Other | Attending: Emergency Medicine | Admitting: Emergency Medicine

## 2018-07-04 ENCOUNTER — Other Ambulatory Visit: Payer: Self-pay

## 2018-07-04 DIAGNOSIS — N201 Calculus of ureter: Secondary | ICD-10-CM | POA: Diagnosis not present

## 2018-07-04 DIAGNOSIS — Z79899 Other long term (current) drug therapy: Secondary | ICD-10-CM | POA: Insufficient documentation

## 2018-07-04 DIAGNOSIS — N132 Hydronephrosis with renal and ureteral calculous obstruction: Secondary | ICD-10-CM | POA: Diagnosis not present

## 2018-07-04 DIAGNOSIS — N183 Chronic kidney disease, stage 3 (moderate): Secondary | ICD-10-CM | POA: Diagnosis not present

## 2018-07-04 DIAGNOSIS — R112 Nausea with vomiting, unspecified: Secondary | ICD-10-CM | POA: Diagnosis not present

## 2018-07-04 DIAGNOSIS — I129 Hypertensive chronic kidney disease with stage 1 through stage 4 chronic kidney disease, or unspecified chronic kidney disease: Secondary | ICD-10-CM | POA: Insufficient documentation

## 2018-07-04 DIAGNOSIS — K5730 Diverticulosis of large intestine without perforation or abscess without bleeding: Secondary | ICD-10-CM | POA: Diagnosis not present

## 2018-07-04 DIAGNOSIS — E039 Hypothyroidism, unspecified: Secondary | ICD-10-CM | POA: Insufficient documentation

## 2018-07-04 DIAGNOSIS — R109 Unspecified abdominal pain: Secondary | ICD-10-CM | POA: Diagnosis present

## 2018-07-04 LAB — BASIC METABOLIC PANEL
Anion gap: 10 (ref 5–15)
BUN: 31 mg/dL — ABNORMAL HIGH (ref 8–23)
CHLORIDE: 101 mmol/L (ref 98–111)
CO2: 26 mmol/L (ref 22–32)
Calcium: 8.8 mg/dL — ABNORMAL LOW (ref 8.9–10.3)
Creatinine, Ser: 1.59 mg/dL — ABNORMAL HIGH (ref 0.44–1.00)
GFR calc Af Amer: 38 mL/min — ABNORMAL LOW (ref >60)
GFR calc non Af Amer: 33 mL/min — ABNORMAL LOW (ref >60)
Glucose, Bld: 121 mg/dL — ABNORMAL HIGH (ref 70–99)
Potassium: 3.7 mmol/L (ref 3.5–5.1)
Sodium: 137 mmol/L (ref 135–145)

## 2018-07-04 LAB — CBC
HCT: 39.8 % (ref 36.0–46.0)
Hemoglobin: 12.3 g/dL (ref 12.0–15.0)
MCH: 29.1 pg (ref 26.0–34.0)
MCHC: 30.9 g/dL (ref 30.0–36.0)
MCV: 94.3 fL (ref 80.0–100.0)
Platelets: 281 10*3/uL (ref 150–400)
RBC: 4.22 MIL/uL (ref 3.87–5.11)
RDW: 15.3 % (ref 11.5–15.5)
WBC: 8.3 10*3/uL (ref 4.0–10.5)
nRBC: 0 % (ref 0.0–0.2)

## 2018-07-04 MED ORDER — LORAZEPAM 2 MG/ML IJ SOLN
1.0000 mg | Freq: Once | INTRAMUSCULAR | Status: AC
  Administered 2018-07-04: 1 mg via INTRAVENOUS
  Filled 2018-07-04: qty 1

## 2018-07-04 MED ORDER — SODIUM CHLORIDE 0.9 % IV BOLUS
500.0000 mL | Freq: Once | INTRAVENOUS | Status: AC
  Administered 2018-07-04: 500 mL via INTRAVENOUS

## 2018-07-04 MED ORDER — SODIUM CHLORIDE 0.9 % IV SOLN
INTRAVENOUS | Status: DC
  Administered 2018-07-04: 13:00:00 via INTRAVENOUS

## 2018-07-04 MED ORDER — ONDANSETRON 4 MG PO TBDP: 4.0000 mg | ORAL_TABLET | Freq: Three times a day (TID) | ORAL | 1 refills | Status: DC | PRN

## 2018-07-04 MED ORDER — ONDANSETRON HCL 4 MG/2ML IJ SOLN
4.0000 mg | Freq: Once | INTRAMUSCULAR | Status: AC
  Administered 2018-07-04: 4 mg via INTRAVENOUS
  Filled 2018-07-04: qty 2

## 2018-07-04 MED ORDER — HYDROMORPHONE HCL 1 MG/ML IJ SOLN
1.0000 mg | Freq: Once | INTRAMUSCULAR | Status: AC
  Administered 2018-07-04: 1 mg via INTRAVENOUS
  Filled 2018-07-04: qty 1

## 2018-07-04 NOTE — ED Triage Notes (Addendum)
C/o of right sided flank pain x 2 days.  Pt was seen yesterday ans was told she has a kidney stone. States the pain meds prescribed yesterday has not helped

## 2018-07-04 NOTE — ED Provider Notes (Addendum)
[facility] EMERGENCY DEPARTMENT Provider Note   CSN: [id number] Arrival date & time: 07/04/18  1117    History   Chief Complaint Chief Complaint  Patient presents with  . Flank Pain    HPI Kathleen Vance is a 68 y.o. female.     Patient returns with persistent flank pain.  She states that it is bilateral flank pain.  Patient was seen yesterday with identification of a 6 mm right UPJ stone.  Without any hydronephrosis.  Renal function was baseline she does have known chronic renal disease.  Patient was discharged home with prescription for Percocet.  Patient was not able to get that filled because her pharmacy does not open till 1:00 today.  Patient back with pain on both sides.  Reading through the noted yesterday they had difficulty controlling her pain.  Patient states she took some of her hydrocodone that she normally has at home for gout during the night because she did not have any pain medication that and she vomited at least once.  Patient denies taking the hydrocodone on a regular basis.  States she just takes it when her gout flares and she has not had any in 6 months.  Patient without any history of kidney stones in the past.  Symptoms actually started 2 days ago.  They started the evening prior to yesterday's visit.     Past Medical History:  Diagnosis Date  . Aortic stenosis   . Arthritis   . Celiac artery stenosis ([place])   . Gout   . Hyperlipidemia   . Hypertension   . Hypothyroidism   . Kidney failure   . Prediabetes   . Renal artery stenosis [facility])     Patient Active Problem List   Diagnosis Date Noted  . Acute renal failure superimposed on stage 3 chronic kidney disease ([place]) 08/12/2017  . Lobar pneumonia ([place]) 08/12/2017  . Acidosis   . Acute on chronic respiratory failure with hypercapnia ([place])   . HCAP (healthcare-associated pneumonia) 08/05/2017  . Liver lesion 08/05/2017  . Acute renal failure superimposed on stage 2 chronic kidney disease ([place])  08/05/2017  . Gastroesophageal reflux disease   . Class 2 obesity due to excess calories with body mass index (BMI) of 38.0 to 38.9 in adult   . Chest pain 07/22/2017  . Hypertension 07/22/2017  . Hypothyroidism 07/22/2017  . Hyperlipidemia 07/22/2017  . Gout 07/22/2017  . Prediabetes 07/22/2017  . Hypomagnesemia 07/22/2017  . Acute lower UTI 07/22/2017    Past Surgical History:  Procedure Laterality Date  . ANKLE SURGERY Bilateral   . CATARACT EXTRACTION    . CESAREAN SECTION    . CHOLECYSTECTOMY    . PARTIAL HYSTERECTOMY    . REPAIR KNEE LIGAMENT       OB History   No obstetric history on file.      Home Medications    Prior to Admission medications   Medication Sig Start Date End Date Taking? Authorizing Provider  albuterol (PROVENTIL) (2.5 MG/3ML) 0.083% nebulizer solution Take 2.5 mg by nebulization every 4 (four) hours as needed for wheezing or shortness of breath.  05/26/18   [provider]  allopurinol (ZYLOPRIM) 100 MG tablet Take 100 mg by mouth every morning.    [provider]  furosemide (LASIX) 40 MG tablet Take 1 tablet (40 mg total) by mouth daily. 08/16/17   Orson Eva, MD  gabapentin (NEURONTIN) 300 MG capsule Take 300 mg by mouth 3 (three) times daily.  [provider]  hydrALAZINE (APRESOLINE) 50 MG tablet Take 50 mg by mouth 3 (three) times daily.    [provider]  HYDROcodone-acetaminophen (NORCO) 7.5-325 MG tablet Take 1 tablet by mouth 3 (three) times daily as needed for moderate pain.    [provider]  labetalol (NORMODYNE) 200 MG tablet Take 1 tablet (200 mg total) by mouth 2 (two) times daily. 08/14/17   Orson Eva, MD  levothyroxine (SYNTHROID, LEVOTHROID) 100 MCG tablet Take 100 mcg by mouth daily before breakfast.    [provider]  losartan (COZAAR) 25 MG tablet Take 25 mg by mouth daily.    [provider]  oxyCODONE-acetaminophen (PERCOCET) 5-325 MG tablet Take 1-2 tablets  by mouth every 6 (six) hours as needed for severe pain. 07/03/18   Julianne Rice, MD  Vitamin D, Ergocalciferol, (DRISDOL) 50000 units CAPS capsule Take 50,000 Units by mouth every Monday.     [provider]    Family History Family History  Problem Relation Age of Onset  . CAD Mother   . Heart attack Mother   . CAD Father   . Pneumonia Sister   . Other Brother        killed at age 36  . CAD Maternal Grandmother   . Heart attack Maternal Grandmother   . Diabetes Maternal Grandfather   . Other Paternal Grandmother        died after child birth  . Heart attack Paternal Grandfather     Social History Social History   Tobacco Use  . Smoking status: Never Smoker  . Smokeless tobacco: Never Used  Substance Use Topics  . Alcohol use: Yes    Comment: rarely  . Drug use: Never     Allergies   Bactrim [sulfamethoxazole-trimethoprim]; Ciprofloxacin; Nitrofurantoin; Metronidazole; Amoxicillin; and Erythromycin   Review of Systems Review of Systems  Constitutional: Negative for chills and fever.  HENT: Negative for rhinorrhea and sore throat.   Eyes: Negative for visual disturbance.  Respiratory: Negative for cough and shortness of breath.   Cardiovascular: Negative for chest pain and leg swelling.  Gastrointestinal: Positive for nausea and vomiting. Negative for abdominal pain and diarrhea.  Genitourinary: Positive for flank pain. Negative for dysuria.  Musculoskeletal: Positive for back pain. Negative for neck pain.  Skin: Negative for rash.  Neurological: Negative for dizziness, light-headedness and headaches.  Hematological: Does not bruise/bleed easily.  Psychiatric/Behavioral: Negative for confusion.     Physical Exam Updated Vital Signs BP (!) 191/105 (BP Location: Right Arm)   Pulse 66   Temp 97.6 F (36.4 C) (Oral)   Resp 19   Ht 1.626 m (5\' 4" )   Wt 94.8 kg   SpO2 93%   BMI 35.87 kg/m   Physical Exam Vitals signs and nursing note reviewed.   Constitutional:      General: She is not in acute distress.    Appearance: She is well-developed.  HENT:     Head: Normocephalic and atraumatic.  Eyes:     Extraocular Movements: Extraocular movements intact.     Conjunctiva/sclera: Conjunctivae normal.     Pupils: Pupils are equal, round, and reactive to light.  Neck:     Musculoskeletal: Neck supple.  Cardiovascular:     Rate and Rhythm: Normal rate and regular rhythm.     Heart sounds: Normal heart sounds. No murmur.  Pulmonary:     Effort: Pulmonary effort is normal. No respiratory distress.     Breath sounds: Normal breath sounds.  Abdominal:     General: Bowel sounds are normal.     Palpations: Abdomen is soft.     Tenderness: There is no abdominal tenderness.  Musculoskeletal: Normal range of motion.  Skin:    General: Skin is warm and dry.  Neurological:     General: No focal deficit present.     Mental Status: She is alert and oriented to person, place, and time.      ED Treatments / Results  Labs (all labs ordered are listed, but only abnormal results are displayed) Labs Reviewed  BASIC METABOLIC PANEL - Abnormal; Notable for the following components:      Result Value   Glucose, Bld 121 (*)    BUN 31 (*)    Creatinine, Ser 1.59 (*)    Calcium 8.8 (*)    GFR calc non Af Amer 33 (*)    GFR calc Af Amer 38 (*)    All other components within normal limits  CBC    EKG None  Radiology Ct Renal Stone Study  Result Date: 07/04/2018 CLINICAL DATA:  Per ED notes: C/o of right sided flank pain x 2 days. Pt was seen yesterday ans was told she has a kidney stone. States the pain meds prescribed yesterday has not helped MD aware pt was scanned <24hrs ago- still wants scan EXAM: CT ABDOMEN AND PELVIS WITHOUT CONTRAST TECHNIQUE: Multidetector CT imaging of the abdomen and pelvis was performed following the standard protocol without IV contrast. COMPARISON:  07/03/2018, 08/10/2004 FINDINGS: Lower chest: Cardiomegaly.  No pleural or pericardial effusion. Hepatobiliary: Segment 7 hepatic cyst. Cholecystectomy clips. Distended CBD. No new liver lesion. Pancreas: Unremarkable. No pancreatic ductal dilatation or surrounding inflammatory changes. Spleen: Normal in size without focal abnormality. Adrenals/Urinary Tract: Bilateral nephrolithiasis, 8 mm calculus at the right UPJ without hydronephrosis. largest stone on the left 7 mm in the mid renal collecting system. Ureters decompressed distally. Urinary bladder incompletely distended. Stomach/Bowel: Stomach is nondistended. Small bowel decompressed. Appendix not discretely identified. Scattered diverticula predominantly in the descending and sigmoid portions, without significant adjacent inflammatory/edematous change or abscess. Vascular/Lymphatic: Scattered aortoiliac arterial plaque without aneurysm. No abdominal or pelvic adenopathy. Reproductive: Status post hysterectomy. No adnexal masses. Other: No ascites. No free air. Musculoskeletal: Small umbilical hernia containing mesenteric fat. Facet DJD in the lower lumbar spine. Negative for fracture or worrisome bone lesion. IMPRESSION: 1. Bilateral nephrolithiasis with 8 mm calculus at the right UPJ without hydronephrosis. 2. Descending and sigmoid diverticulosis. Electronically Signed   By: Lucrezia Europe M.D.   On: 07/04/2018 13:45   Ct Renal Stone Study  Result Date: 07/03/2018 CLINICAL DATA:  LEFT flank pain that started last night but is progressively getting worse. Per patient pain is starting to radiate into abd. Denies any nausea, vomiting, diarrhea, fevers, or urinary symptoms. Hypertension, renal artery stenosis, kidney failure. Celiac axis stenosis, aortic stenosis. Previous cholecystectomy, hysterectomy, C-section. EXAM: CT ABDOMEN AND PELVIS WITHOUT CONTRAST TECHNIQUE: Multidetector CT imaging of the abdomen and pelvis was performed following the standard protocol without IV contrast. COMPARISON:  CT of the abdomen and  pelvis 08/10/2004 FINDINGS: Lower chest: No pulmonary nodules, pleural effusions, or infiltrates. Heart is mildly enlarged. There is coronary artery calcification, loop partially imaged. Hepatobiliary: Interval development of an 11 millimeter low-attenuation lesion within the posterior aspect of the RIGHT hepatic lobe, likely representing a cyst. Cholecystectomy. Pancreas: Unremarkable. No pancreatic ductal dilatation or surrounding inflammatory changes. Spleen: Normal in size without focal abnormality. Adrenals/Urinary Tract: Adrenal glands are normal in  appearance. There is renal parenchymal thinning bilaterally. Intrarenal stones are identified within both kidneys, largest 7 millimeters in the midpole region of the LEFT kidney. Punctate calcifications seen within the midpole region of the RIGHT kidney. There is a 6 millimeter calculus within the RIGHT ureteropelvic junction. The remainder of the RIGHT ureter and the LEFT ureter are unremarkable. The bladder and visualized portion of the urethra are normal. Stomach/Bowel: The stomach and small bowel loops are unremarkable. There are numerous colonic diverticula but no acute diverticulitis. Appendix is not seen. Vascular/Lymphatic: There is dense atherosclerotic calcification of the abdominal aorta. No aneurysm. No retroperitoneal or mesenteric adenopathy. Reproductive: Uterus is absent. No adnexal mass. Other: No free pelvic fluid. Fat containing paraumbilical hernia. Musculoskeletal: Degenerative changes are seen in the LOWER thoracic and lumbosacral spine. No suspicious lytic or blastic lesions are identified. IMPRESSION: 1. 6 millimeter calculus at the RIGHT ureteropelvic junction. 2. Bilateral intrarenal stones. Bilateral renal parenchymal thinning. 3. Colonic diverticulosis. 4. Coronary artery disease. 5. Status post hysterectomy. 6. Fat containing paraumbilical hernia. Aortic Atherosclerosis (ICD10-I70.0). Electronically Signed   By: Nolon Nations M.D.    On: 07/03/2018 17:42    Procedures Procedures (including critical care time)  Medications Ordered in ED Medications  0.9 %  sodium chloride infusion ( Intravenous New Bag/Given 07/04/18 1309)  ondansetron (ZOFRAN) injection 4 mg (4 mg Intravenous Given 07/04/18 1251)  HYDROmorphone (DILAUDID) injection 1 mg (1 mg Intravenous Given 07/04/18 1251)  sodium chloride 0.9 % bolus 500 mL (0 mLs Intravenous Stopped 07/04/18 1415)  LORazepam (ATIVAN) injection 1 mg (1 mg Intravenous Given 07/04/18 1251)     Initial Impression / Assessment and Plan / ED Course  I have reviewed the triage vital signs and the nursing notes.  Pertinent labs & imaging results that were available during my care of the patient were reviewed by me and considered in my medical decision making (see chart for details).        Discussed with Dr. Lovena Neighbours on-call for urology.  Recommends calling tomorrow for follow-up in the [facility] urology office.  Repeat CT scan shows probably the same persistent right UPJ ureter stone now being measured at 8 mm.  Also does have numerous bilateral stones.  Left side without any ureteral stones.  No signs of any hydronephrosis.  Patient's renal function is at baseline here today.  No fever no leukocytosis.  Urine was not repeated.  Patient received 1 dose of hydromorphone and some Ativan.  And patient became very somnolent.  Will require some further observation.  Patient did tell us that she had taken some of her hydrocodone at home but she seemed to be in extreme pain when she arrived here.  She had not gotten her Percocet prescription filled.  Will make sure patient has antinausea medicine at home as well.  Patient does have a Percocet prescription available to pick up.  Patient's blood pressure was elevated here but in the face of the flank pain difficult to make formal assessment may require follow-up with her primary care doctor for that.  Final Clinical Impressions(s) / ED  Diagnoses   Final diagnoses:  Right ureteral stone    ED Discharge Orders    None       Fredia Sorrow, MD 07/04/18 1429  Patient observed for period of time.  Now alert.  Oxygen saturation is low 90s off of any oxygen.  Patient able to ambulate to the bathroom.  Will add Zofran onto her prescriptions that were provided yesterday.  She will call [facility] urology for follow-up tomorrow.  Discussed with her husband.    Fredia Sorrow, MD 07/04/18 [phone]

## 2018-07-04 NOTE — Discharge Instructions (Signed)
Continue the Percocet that you were prescribed yesterday.  Call urology for follow-up tomorrow morning.  To make an appointment.  They will not necessarily see you tomorrow.  Return for fevers return for any persistent vomiting.  Today's evaluation showed an 8 mm persistent stone at the very beginning of the right ureter.

## 2018-07-05 DIAGNOSIS — R1030 Lower abdominal pain, unspecified: Secondary | ICD-10-CM | POA: Diagnosis not present

## 2018-07-05 DIAGNOSIS — N200 Calculus of kidney: Secondary | ICD-10-CM | POA: Diagnosis not present

## 2018-07-05 DIAGNOSIS — M545 Low back pain: Secondary | ICD-10-CM | POA: Diagnosis not present

## 2018-07-06 ENCOUNTER — Other Ambulatory Visit: Payer: Self-pay | Admitting: Urology

## 2018-07-06 ENCOUNTER — Encounter (HOSPITAL_COMMUNITY): Payer: Self-pay | Admitting: General Practice

## 2018-07-06 DIAGNOSIS — N183 Chronic kidney disease, stage 3 (moderate): Secondary | ICD-10-CM | POA: Diagnosis not present

## 2018-07-06 DIAGNOSIS — N202 Calculus of kidney with calculus of ureter: Secondary | ICD-10-CM | POA: Diagnosis not present

## 2018-07-06 NOTE — H&P (Signed)
ormed consent has been obtained.

## 2018-07-07 ENCOUNTER — Other Ambulatory Visit: Payer: Self-pay | Admitting: Urology

## 2018-07-08 ENCOUNTER — Other Ambulatory Visit: Payer: Self-pay | Admitting: Urology

## 2018-07-08 ENCOUNTER — Ambulatory Visit (HOSPITAL_COMMUNITY): Admission: RE | Admit: 2018-07-08 | Payer: Medicare Other | Source: Home / Self Care | Admitting: Urology

## 2018-07-08 DIAGNOSIS — N200 Calculus of kidney: Secondary | ICD-10-CM | POA: Diagnosis not present

## 2018-07-08 DIAGNOSIS — N390 Urinary tract infection, site not specified: Secondary | ICD-10-CM | POA: Diagnosis not present

## 2018-07-08 DIAGNOSIS — E039 Hypothyroidism, unspecified: Secondary | ICD-10-CM | POA: Diagnosis not present

## 2018-07-08 DIAGNOSIS — E782 Mixed hyperlipidemia: Secondary | ICD-10-CM | POA: Diagnosis not present

## 2018-07-08 DIAGNOSIS — D72829 Elevated white blood cell count, unspecified: Secondary | ICD-10-CM | POA: Diagnosis not present

## 2018-07-08 DIAGNOSIS — R1012 Left upper quadrant pain: Secondary | ICD-10-CM | POA: Diagnosis not present

## 2018-07-08 DIAGNOSIS — D638 Anemia in other chronic diseases classified elsewhere: Secondary | ICD-10-CM | POA: Diagnosis not present

## 2018-07-08 DIAGNOSIS — D631 Anemia in chronic kidney disease: Secondary | ICD-10-CM | POA: Diagnosis not present

## 2018-07-08 DIAGNOSIS — B029 Zoster without complications: Secondary | ICD-10-CM | POA: Diagnosis not present

## 2018-07-08 DIAGNOSIS — R1030 Lower abdominal pain, unspecified: Secondary | ICD-10-CM | POA: Diagnosis not present

## 2018-07-08 DIAGNOSIS — R300 Dysuria: Secondary | ICD-10-CM | POA: Diagnosis not present

## 2018-07-08 HISTORY — DX: Personal history of urinary calculi: Z87.442

## 2018-07-08 SURGERY — LITHOTRIPSY, ESWL
Anesthesia: LOCAL | Laterality: Right

## 2018-07-12 ENCOUNTER — Other Ambulatory Visit: Payer: Self-pay | Admitting: Urology

## 2018-07-15 ENCOUNTER — Ambulatory Visit: Admit: 2018-07-15 | Payer: Medicare Other | Admitting: Urology

## 2018-07-15 SURGERY — LITHOTRIPSY, ESWL
Anesthesia: LOCAL | Laterality: Right

## 2018-07-21 DIAGNOSIS — B0223 Postherpetic polyneuropathy: Secondary | ICD-10-CM | POA: Diagnosis not present

## 2018-07-21 DIAGNOSIS — R140 Abdominal distension (gaseous): Secondary | ICD-10-CM | POA: Diagnosis not present

## 2018-07-21 DIAGNOSIS — B028 Zoster with other complications: Secondary | ICD-10-CM | POA: Diagnosis not present

## 2018-07-29 ENCOUNTER — Emergency Department (HOSPITAL_COMMUNITY): Payer: Medicare Other

## 2018-07-29 ENCOUNTER — Encounter (HOSPITAL_COMMUNITY): Payer: Self-pay

## 2018-07-29 ENCOUNTER — Other Ambulatory Visit: Payer: Self-pay

## 2018-07-29 ENCOUNTER — Emergency Department (HOSPITAL_COMMUNITY)
Admission: EM | Admit: 2018-07-29 | Discharge: 2018-07-29 | Disposition: A | Discharge status: Home / Self Care | Payer: Medicare Other | Attending: Emergency Medicine | Admitting: Emergency Medicine

## 2018-07-29 DIAGNOSIS — I129 Hypertensive chronic kidney disease with stage 1 through stage 4 chronic kidney disease, or unspecified chronic kidney disease: Secondary | ICD-10-CM | POA: Diagnosis not present

## 2018-07-29 DIAGNOSIS — Z79899 Other long term (current) drug therapy: Secondary | ICD-10-CM | POA: Insufficient documentation

## 2018-07-29 DIAGNOSIS — E039 Hypothyroidism, unspecified: Secondary | ICD-10-CM | POA: Diagnosis not present

## 2018-07-29 DIAGNOSIS — K5730 Diverticulosis of large intestine without perforation or abscess without bleeding: Secondary | ICD-10-CM | POA: Diagnosis not present

## 2018-07-29 DIAGNOSIS — R109 Unspecified abdominal pain: Secondary | ICD-10-CM | POA: Insufficient documentation

## 2018-07-29 DIAGNOSIS — N182 Chronic kidney disease, stage 2 (mild): Secondary | ICD-10-CM | POA: Diagnosis not present

## 2018-07-29 DIAGNOSIS — N200 Calculus of kidney: Secondary | ICD-10-CM | POA: Diagnosis not present

## 2018-07-29 DIAGNOSIS — B028 Zoster with other complications: Secondary | ICD-10-CM | POA: Diagnosis not present

## 2018-07-29 DIAGNOSIS — E860 Dehydration: Secondary | ICD-10-CM | POA: Insufficient documentation

## 2018-07-29 DIAGNOSIS — B029 Zoster without complications: Secondary | ICD-10-CM | POA: Diagnosis not present

## 2018-07-29 DIAGNOSIS — K5903 Drug induced constipation: Secondary | ICD-10-CM | POA: Diagnosis not present

## 2018-07-29 DIAGNOSIS — I2510 Atherosclerotic heart disease of native coronary artery without angina pectoris: Secondary | ICD-10-CM | POA: Diagnosis not present

## 2018-07-29 DIAGNOSIS — K598 Other specified functional intestinal disorders: Secondary | ICD-10-CM | POA: Diagnosis not present

## 2018-07-29 DIAGNOSIS — N202 Calculus of kidney with calculus of ureter: Secondary | ICD-10-CM | POA: Diagnosis not present

## 2018-07-29 DIAGNOSIS — T472X5A Adverse effect of stimulant laxatives, initial encounter: Secondary | ICD-10-CM | POA: Diagnosis not present

## 2018-07-29 HISTORY — DX: Zoster without complications: B02.9

## 2018-07-29 LAB — CBC WITH DIFFERENTIAL/PLATELET
ABS IMMATURE GRANULOCYTES: 0.03 10*3/uL (ref 0.00–0.07)
Basophils Absolute: 0.1 10*3/uL (ref 0.0–0.1)
Basophils Relative: 1 %
Eosinophils Absolute: 0.4 10*3/uL (ref 0.0–0.5)
Eosinophils Relative: 4 %
HCT: 41.5 % (ref 36.0–46.0)
Hemoglobin: 13 g/dL (ref 12.0–15.0)
Immature Granulocytes: 0 %
Lymphocytes Relative: 20 %
Lymphs Abs: 1.7 10*3/uL (ref 0.7–4.0)
MCH: 30 pg (ref 26.0–34.0)
MCHC: 31.3 g/dL (ref 30.0–36.0)
MCV: 95.6 fL (ref 80.0–100.0)
Monocytes Absolute: 0.7 10*3/uL (ref 0.1–1.0)
Monocytes Relative: 8 %
Neutro Abs: 5.9 10*3/uL (ref 1.7–7.7)
Neutrophils Relative %: 67 %
Platelets: 251 10*3/uL (ref 150–400)
RBC: 4.34 MIL/uL (ref 3.87–5.11)
RDW: 16.6 % — ABNORMAL HIGH (ref 11.5–15.5)
WBC: 8.8 10*3/uL (ref 4.0–10.5)
nRBC: 0 % (ref 0.0–0.2)

## 2018-07-29 LAB — COMPREHENSIVE METABOLIC PANEL
ALK PHOS: 57 U/L (ref 38–126)
ALT: 22 U/L (ref 0–44)
AST: 25 U/L (ref 15–41)
Albumin: 3.9 g/dL (ref 3.5–5.0)
Anion gap: 9 (ref 5–15)
BUN: 29 mg/dL — AB (ref 8–23)
CO2: 25 mmol/L (ref 22–32)
Calcium: 9.6 mg/dL (ref 8.9–10.3)
Chloride: 105 mmol/L (ref 98–111)
Creatinine, Ser: 1.89 mg/dL — ABNORMAL HIGH (ref 0.44–1.00)
GFR calc Af Amer: 31 mL/min — ABNORMAL LOW (ref >60)
GFR, EST NON AFRICAN AMERICAN: 27 mL/min — AB (ref >60)
Glucose, Bld: 145 mg/dL — ABNORMAL HIGH (ref 70–99)
Potassium: 4.1 mmol/L (ref 3.5–5.1)
Sodium: 139 mmol/L (ref 135–145)
Total Bilirubin: 0.7 mg/dL (ref 0.3–1.2)
Total Protein: 7.8 g/dL (ref 6.5–8.1)

## 2018-07-29 MED ORDER — FENTANYL CITRATE (PF) 100 MCG/2ML IJ SOLN
50.0000 ug | Freq: Once | INTRAMUSCULAR | Status: AC
  Administered 2018-07-29: 50 ug via INTRAVENOUS
  Filled 2018-07-29: qty 2

## 2018-07-29 MED ORDER — GABAPENTIN 300 MG PO CAPS
300.0000 mg | ORAL_CAPSULE | Freq: Once | ORAL | Status: AC
  Administered 2018-07-29: 300 mg via ORAL
  Filled 2018-07-29: qty 1

## 2018-07-29 MED ORDER — ONDANSETRON HCL 4 MG/2ML IJ SOLN
4.0000 mg | Freq: Once | INTRAMUSCULAR | Status: AC
  Administered 2018-07-29: 4 mg via INTRAVENOUS
  Filled 2018-07-29: qty 2

## 2018-07-29 MED ORDER — FLEET ENEMA 7-19 GM/118ML RE ENEM
1.0000 | ENEMA | Freq: Once | RECTAL | Status: AC
  Administered 2018-07-29: 1 via RECTAL

## 2018-07-29 MED ORDER — SODIUM CHLORIDE 0.9 % IV BOLUS
1000.0000 mL | Freq: Once | INTRAVENOUS | Status: AC
  Administered 2018-07-29: 1000 mL via INTRAVENOUS

## 2018-07-29 NOTE — ED Notes (Signed)
Patient transported to X-ray 

## 2018-07-29 NOTE — ED Notes (Signed)
ED Provider at bedside. 

## 2018-07-29 NOTE — ED Notes (Signed)
Pt reports difficulty sleeping due to shingles pain and abdominal pain the past few days and pain has been progressively getting worse. Pt also reports increased redness on buttocks lately where she has been sitting the past few days and unable to lay down.

## 2018-07-29 NOTE — ED Provider Notes (Signed)
[facility] EMERGENCY DEPARTMENT Provider Note   CSN: [id number] Arrival date & time: 07/29/18  8250  Time seen 4:45 AM  History   Chief Complaint Chief Complaint  Patient presents with   Abdominal Pain    HPI Kathleen Vance is a 68 y.o. female.     HPI patient states she started having shingles on her left abdomen about 3 to 4 weeks ago.  She states she is having worsening abdominal pain.  She also notes she was found to have a right UPJ stone on a CT scan done on March 3 when she came in for abdominal pain.  She states she has not had any pain on the right side.  She states she normally has a bowel movement every 2 to 3 days however she has not had a bowel movement for at least a week.  She states she has had loss of appetite since she was diagnosed with the shingles.  She has had nausea without vomiting that started yesterday morning.  She denies diarrhea.  She states she has had low-grade fever for the past week of 99.4-99.6.  She denies any clots cough.  She states her husband bought her a laxative couple days ago and states it is a small orange pill that she is taking every day without relief.  She also states she started stool softeners.  Patient is status post cholecystectomy and C-section.  She states laying on her right side makes the pain a lot worse, deep breathing and movement makes it worse.  She states she has been sleeping in a recliner the past few weeks and feels like she is getting a decubitus ulcer on her buttock.  She states she feels like her abdomen has been swelling and she only has 1 pair of pants that have a elastic waistband that she is able to wear.  PCP Celene Squibb, MD   Past Medical History:  Diagnosis Date   Aortic stenosis    Arthritis    Celiac artery stenosis (HCC)    Gout    History of kidney stones    Hyperlipidemia    Hypertension    Hypothyroidism    Kidney failure    Prediabetes    Renal artery stenosis (HCC)    Shingles  rash     Patient Active Problem List   Diagnosis Date Noted   Acute renal failure superimposed on stage 3 chronic kidney disease ([place]) 08/12/2017   Lobar pneumonia ([place]) 08/12/2017   Acidosis    Acute on chronic respiratory failure with hypercapnia ([place])    HCAP (healthcare-associated pneumonia) 08/05/2017   Liver lesion 08/05/2017   Acute renal failure superimposed on stage 2 chronic kidney disease ([place]) 08/05/2017   Gastroesophageal reflux disease    Class 2 obesity due to excess calories with body mass index (BMI) of 38.0 to 38.9 in adult    Chest pain 07/22/2017   Hypertension 07/22/2017   Hypothyroidism 07/22/2017   Hyperlipidemia 07/22/2017   Gout 07/22/2017   Prediabetes 07/22/2017   Hypomagnesemia 07/22/2017   Acute lower UTI 07/22/2017    Past Surgical History:  Procedure Laterality Date   ANKLE SURGERY Right    CATARACT EXTRACTION Bilateral    CESAREAN SECTION     CHOLECYSTECTOMY     DILATION AND CURETTAGE OF UTERUS     PARTIAL HYSTERECTOMY     REPAIR KNEE LIGAMENT Right    TUBAL LIGATION       OB History   No obstetric  history on file.      Home Medications    Prior to Admission medications   Medication Sig Start Date End Date Taking? Authorizing Provider  albuterol (PROVENTIL) (2.5 MG/3ML) 0.083% nebulizer solution Take 2.5 mg by nebulization every 4 (four) hours as needed for wheezing or shortness of breath.  05/26/18   [provider]  allopurinol (ZYLOPRIM) 100 MG tablet Take 100 mg by mouth every morning.    [provider]  furosemide (LASIX) 40 MG tablet Take 1 tablet (40 mg total) by mouth daily. 08/16/17   Orson Eva, MD  gabapentin (NEURONTIN) 300 MG capsule Take 300 mg by mouth 3 (three) times daily.     [provider]  hydrALAZINE (APRESOLINE) 50 MG tablet Take 50 mg by mouth 3 (three) times daily.    [provider]  HYDROcodone-acetaminophen (NORCO) 7.5-325 MG tablet Take 1 tablet  by mouth 3 (three) times daily as needed for moderate pain.    [provider]  HYDROmorphone (DILAUDID) 2 MG tablet Take 2 mg by mouth every 4 (four) hours as needed for severe pain.    [provider]  labetalol (NORMODYNE) 200 MG tablet Take 1 tablet (200 mg total) by mouth 2 (two) times daily. 08/14/17   Orson Eva, MD  levothyroxine (SYNTHROID, LEVOTHROID) 100 MCG tablet Take 100 mcg by mouth daily before breakfast.    [provider]  losartan (COZAAR) 25 MG tablet Take 25 mg by mouth daily.    [provider]  ondansetron (ZOFRAN ODT) 4 MG disintegrating tablet Take 1 tablet (4 mg total) by mouth every 8 (eight) hours as needed. 07/04/18   Fredia Sorrow, MD  oxyCODONE-acetaminophen (PERCOCET) 5-325 MG tablet Take 1-2 tablets by mouth every 6 (six) hours as needed for severe pain. 07/03/18   Julianne Rice, MD  tiZANidine (ZANAFLEX) 4 MG tablet Take 4 mg by mouth every 6 (six) hours as needed for muscle spasms.    [provider]  Vitamin D, Ergocalciferol, (DRISDOL) 50000 units CAPS capsule Take 50,000 Units by mouth every Monday.     [provider]    Family History Family History  Problem Relation Age of Onset   CAD Mother    Heart attack Mother    CAD Father    Pneumonia Sister    Other Brother        killed at age 47   CAD Maternal Grandmother    Heart attack Maternal Grandmother    Diabetes Maternal Grandfather    Other Paternal Grandmother        died after child birth   Heart attack Paternal Grandfather     Social History Social History   Tobacco Use   Smoking status: Never Smoker   Smokeless tobacco: Never Used  Substance Use Topics   Alcohol use: Yes    Comment: rarely   Drug use: Never  lives at home Lives with spouse   Allergies   Bactrim [sulfamethoxazole-trimethoprim]; Ciprofloxacin; Nitrofurantoin; Metronidazole; Amoxicillin; and Erythromycin   Review of Systems Review of Systems   All other systems reviewed and are negative.    Physical Exam Updated Vital Signs BP (!) 203/106 (BP Location: Left Arm)    Pulse 90    Temp 99.2 F (37.3 C) (Oral)    Resp 20    Ht 5\' 4"  (1.626 m)    Wt 86.2 kg    SpO2 95%    BMI 32.61 kg/m   Vital signs normal except for hypertension  Physical Exam Vitals signs and nursing note reviewed.  Constitutional:      General: She is in acute distress.     Appearance: Normal appearance. She is well-developed. She is not ill-appearing or toxic-appearing.  HENT:     Head: Normocephalic and atraumatic.     Right Ear: External ear normal.     Left Ear: External ear normal.     Nose: Nose normal. No mucosal edema or rhinorrhea.     Mouth/Throat:     Mouth: Mucous membranes are dry.     Dentition: No dental abscesses.     Pharynx: No uvula swelling.     Comments: Lips are dry Eyes:     Conjunctiva/sclera: Conjunctivae normal.     Pupils: Pupils are equal, round, and reactive to light.  Neck:     Musculoskeletal: Full passive range of motion without pain, normal range of motion and neck supple.  Cardiovascular:     Rate and Rhythm: Normal rate and regular rhythm.     Heart sounds: Normal heart sounds. No murmur. No friction rub. No gallop.   Pulmonary:     Effort: Pulmonary effort is normal. No respiratory distress.     Breath sounds: Normal breath sounds. No wheezing, rhonchi or rales.  Chest:     Chest wall: No tenderness or crepitus.  Abdominal:     General: There is distension.     Palpations: Abdomen is soft.     Tenderness: There is no abdominal tenderness. There is no guarding or rebound.       Comments: Old dried up rash from shingles is noted  Musculoskeletal: Normal range of motion.        General: No tenderness.     Comments: Moves all extremities well.   Skin:    General: Skin is warm and dry.     Coloration: Skin is not pale.     Findings: No erythema or rash.          Comments: Patient has some redness on  either side of her gluteal crease in the mid area, there is an area on her right cheek that always has an excoriated type appearance with a small pinpoint breakdown of skin.  There is no sign of secondary infection.  Neurological:     Mental Status: She is alert and oriented to person, place, and time.     Cranial Nerves: No cranial nerve deficit.  Psychiatric:        Mood and Affect: Mood is anxious.        Speech: Speech is rapid and pressured.        Behavior: Behavior is agitated.      ED Treatments / Results  Labs (all labs ordered are listed, but only abnormal results are displayed) Results for orders placed or performed during the hospital encounter of 07/29/18  Comprehensive metabolic panel  Result Value Ref Range   Sodium 139 135 - 145 mmol/L   Potassium 4.1 3.5 - 5.1 mmol/L   Chloride 105 98 - 111 mmol/L   CO2 25 22 - 32 mmol/L   Glucose, Bld 145 (H) 70 - 99 mg/dL   BUN 29 (H) 8 - 23 mg/dL   Creatinine, Ser 1.89 (H) 0.44 - 1.00 mg/dL   Calcium 9.6 8.9 - 10.3 mg/dL   Total Protein 7.8 6.5 - 8.1 g/dL   Albumin 3.9 3.5 - 5.0 g/dL   AST 25 15 - 41 U/L   ALT 22 0 - 44 U/L  Alkaline Phosphatase 57 38 - 126 U/L   Total Bilirubin 0.7 0.3 - 1.2 mg/dL   GFR calc non Af Amer 27 (L) >60 mL/min   GFR calc Af Amer 31 (L) >60 mL/min   Anion gap 9 5 - 15  CBC with Differential  Result Value Ref Range   WBC 8.8 4.0 - 10.5 K/uL   RBC 4.34 3.87 - 5.11 MIL/uL   Hemoglobin 13.0 12.0 - 15.0 g/dL   HCT 41.5 36.0 - 46.0 %   MCV 95.6 80.0 - 100.0 fL   MCH 30.0 26.0 - 34.0 pg   MCHC 31.3 30.0 - 36.0 g/dL   RDW 16.6 (H) 11.5 - 15.5 %   Platelets 251 150 - 400 K/uL   nRBC 0.0 0.0 - 0.2 %   Neutrophils Relative % 67 %   Neutro Abs 5.9 1.7 - 7.7 K/uL   Lymphocytes Relative 20 %   Lymphs Abs 1.7 0.7 - 4.0 K/uL   Monocytes Relative 8 %   Monocytes Absolute 0.7 0.1 - 1.0 K/uL   Eosinophils Relative 4 %   Eosinophils Absolute 0.4 0.0 - 0.5 K/uL   Basophils Relative 1 %   Basophils  Absolute 0.1 0.0 - 0.1 K/uL   Immature Granulocytes 0 %   Abs Immature Granulocytes 0.03 0.00 - 0.07 K/uL   Laboratory interpretation all normal except stable renal insufficiency, hyperglycemia    EKG None  Dg Abd 2 Views  Result Date: 07/29/2018 CLINICAL DATA:  Left-sided abdominal pain with no bowel movement for 1 week EXAM: ABDOMEN - 2 VIEW COMPARISON:  07/06/2018 FINDINGS: There are a few gas distended small bowel loops with fluid levels in the central abdomen. Gas or stool is seen throughout the colon. The stomach is moderately distended with a fluid level. No concerning mass effect or gas collection. Bilateral nephrolithiasis, including a 8 mm stone at the right UPJ based on prior abdominal CT. Clear lung bases IMPRESSION: 1. There are a few dilated small bowel loops with fluid levels in the central abdomen. Enteritis or early/partial small bowel obstruction is possible. 2. History of constipation. There is mild to moderate stool volume mainly in the left colon. 3. Bilateral nephrolithiasis including an 29mm stone at the right UPJ as seen on recent abdominal CT Electronically Signed   By: Monte Fantasia M.D.   On: 07/29/2018 06:12   Ct Renal Stone Study  Result Date: 07/04/2018 CLINICAL DATA:  Per ED notes: C/o of right sided flank pain x 2 days. Pt was seen yesterday ans was told she has a kidney stone. States the pain meds prescribed yesterday has not helped MD aware pt was scanned <24hrs ago- still wants scan EXAM: CT ABDOMEN AND PELVIS WITHOUT CONTRAST TECHNIQUE: Multidetector CT imaging of the abdomen and pelvis was performed following the standard protocol without IV contrast. COMPARISON:  07/03/2018, 08/10/2004 FINDINGS: Lower chest: Cardiomegaly. No pleural or pericardial effusion. Hepatobiliary: Segment 7 hepatic cyst. Cholecystectomy clips. Distended CBD. No new liver lesion. Pancreas: Unremarkable. No pancreatic ductal dilatation or surrounding inflammatory changes. Spleen: Normal in  size without focal abnormality. Adrenals/Urinary Tract: Bilateral nephrolithiasis, 8 mm calculus at the right UPJ without hydronephrosis. largest stone on the left 7 mm in the mid renal collecting system. Ureters decompressed distally. Urinary bladder incompletely distended. Stomach/Bowel: Stomach is nondistended. Small bowel decompressed. Appendix not discretely identified. Scattered diverticula predominantly in the descending and sigmoid portions, without significant adjacent inflammatory/edematous change or abscess. Vascular/Lymphatic: Scattered aortoiliac arterial plaque without aneurysm. No abdominal  or pelvic adenopathy. Reproductive: Status post hysterectomy. No adnexal masses. Other: No ascites. No free air. Musculoskeletal: Small umbilical hernia containing mesenteric fat. Facet DJD in the lower lumbar spine. Negative for fracture or worrisome bone lesion. IMPRESSION: 1. Bilateral nephrolithiasis with 8 mm calculus at the right UPJ without hydronephrosis. 2. Descending and sigmoid diverticulosis. Electronically Signed   By: Lucrezia Europe M.D.   On: 07/04/2018 13:45   Ct Renal Stone Study  Result Date: 07/03/2018 CLINICAL DATA:  LEFT flank pain that started last night but is progressively getting worse. Per patient pain is starting to radiate into abd. Denies any nausea, vomiting, diarrhea, fevers, or urinary symptoms. Hypertension, renal artery stenosis, kidney failure. Celiac axis stenosis, aortic stenosis. Previous cholecystectomy, hysterectomy, C-section. EXAM: CT ABDOMEN AND PELVIS WITHOUT CONTRAST TECHNIQUE: Multidetector CT imaging of the abdomen and pelvis was performed following the standard protocol without IV contrast. COMPARISON:  CT of the abdomen and pelvis 08/10/2004 FINDINGS: Lower chest: No pulmonary nodules, pleural effusions, or infiltrates. Heart is mildly enlarged. There is coronary artery calcification, loop partially imaged. Hepatobiliary: Interval development of an 11 millimeter  low-attenuation lesion within the posterior aspect of the RIGHT hepatic lobe, likely representing a cyst. Cholecystectomy. Pancreas: Unremarkable. No pancreatic ductal dilatation or surrounding inflammatory changes. Spleen: Normal in size without focal abnormality. Adrenals/Urinary Tract: Adrenal glands are normal in appearance. There is renal parenchymal thinning bilaterally. Intrarenal stones are identified within both kidneys, largest 7 millimeters in the midpole region of the LEFT kidney. Punctate calcifications seen within the midpole region of the RIGHT kidney. There is a 6 millimeter calculus within the RIGHT ureteropelvic junction. The remainder of the RIGHT ureter and the LEFT ureter are unremarkable. The bladder and visualized portion of the urethra are normal. Stomach/Bowel: The stomach and small bowel loops are unremarkable. There are numerous colonic diverticula but no acute diverticulitis. Appendix is not seen. Vascular/Lymphatic: There is dense atherosclerotic calcification of the abdominal aorta. No aneurysm. No retroperitoneal or mesenteric adenopathy. Reproductive: Uterus is absent. No adnexal mass. Other: No free pelvic fluid. Fat containing paraumbilical hernia. Musculoskeletal: Degenerative changes are seen in the LOWER thoracic and lumbosacral spine. No suspicious lytic or blastic lesions are identified. IMPRESSION: 1. 6 millimeter calculus at the RIGHT ureteropelvic junction. 2. Bilateral intrarenal stones. Bilateral renal parenchymal thinning. 3. Colonic diverticulosis. 4. Coronary artery disease. 5. Status post hysterectomy. 6. Fat containing paraumbilical hernia. Aortic Atherosclerosis (ICD10-I70.0). Electronically Signed   By: Nolon Nations M.D.   On: 07/03/2018 17:42        CT Renal Stone Study July 03, 2018 IMPRESSION: 1. 6 millimeter calculus at the RIGHT ureteropelvic junction. 2. Bilateral intrarenal stones. Bilateral renal parenchymal thinning. 3. Colonic  diverticulosis. 4. Coronary artery disease. 5. Status post hysterectomy. 6. Fat containing paraumbilical hernia.  Aortic Atherosclerosis (ICD10-I70.0).   Electronically Signed   By: Nolon Nations M.D.   On: 07/03/2018 17:42   CT Renal Stone Study July 04, 2018 IMPRESSION: 1. Bilateral nephrolithiasis with 8 mm calculus at the right UPJ without hydronephrosis. 2. Descending and sigmoid diverticulosis.   Electronically Signed   By: Lucrezia Europe M.D.   On: 07/04/2018 13:45    Procedures Procedures (including critical care time)  Medications Ordered in ED Medications  sodium chloride 0.9 % bolus 1,000 mL (0 mLs Intravenous Stopped 07/29/18 0615)  sodium phosphate (FLEET) 7-19 GM/118ML enema 1 enema (1 enema Rectal Given 07/29/18 0535)  gabapentin (NEURONTIN) capsule 300 mg (300 mg Oral Given 07/29/18 0614)  fentaNYL (SUBLIMAZE) injection  50 mcg (50 mcg Intravenous Given 07/29/18 0635)  ondansetron (ZOFRAN) injection 4 mg (4 mg Intravenous Given 07/29/18 2330)     Initial Impression / Assessment and Plan / ED Course  I have reviewed the triage vital signs and the nursing notes.  Pertinent labs & imaging results that were available during my care of the patient were reviewed by me and considered in my medical decision making (see chart for details).   Patient appears to be dehydrated she was given IV fluids and laboratory testing was done.  Start with the abdomen to try to get an idea of how constipated she is.  I looked at her x-ray she did have a few loops of small bowel in the mid abdomen but she also had a lot of stool in her descending colon.  She was given a fleets enema while waiting for the radiologist to read her x-ray study.  Pt had no results with the enema  I reviewed her radiologist reading and concern is that she may have a SBO.  She does have a history of prior abdominal surgeries.  I do not think enteritis is a concern because she has no vomiting and no  diarrhea.  I am also concerned that she could have diverticulitis.  When I went back to talk to her to try to get straight about her pains, my concern was that the pain she is tell me about was her neuropathic shingles pain.  But she states there is a new pain that seems to be in her left lower quadrant.  I realize that she has had 2 CT scans done within the past month.  I tried to speak to the radiologist to see if MRI could be substituted however I was on hold for 5 minutes with nobody answering the phone.  I therefore proceeded with another CT scan to look for SBO, diverticulitis.  Patient was given gabapentin in case she is actually just having postherpetic neuropathy.  07:20 AM Dr Lita Mains will follow up on her CT scan results and disposition.     Review the [place] shows patient got #120 oxycodone 5/325 on October 21, 2016, #120 oxycodone 5/325 mg tablets on April 09, 2017 and then she got #90 hydrocodone 7.5/325 mg tablets on January 8, #10 oxycodone 5/325 on February 29, #10 oxycodone 5 mg tablets on March 2, #20 Dilaudid 2 mg tablets on March 3, #20 Dilaudid 2 mg tablets on March 13, and #30 Dilaudid 2 mg tablets on March 18.  Final Clinical Impressions(s) / ED Diagnoses   Final diagnoses:  Drug-induced constipation  Abdominal pain, left lateral  Herpes zoster with complication    Disposition pending  Rolland Porter, MD, Barbette Or, MD 07/29/18 [phone]

## 2018-07-29 NOTE — ED Triage Notes (Signed)
Pt presents from home c/o left side abdominal pain X3 weeks. Pt recovering from Shingles and rash currently crusted over. Pt reports last BM 1 week ago.

## 2018-08-03 DIAGNOSIS — N200 Calculus of kidney: Secondary | ICD-10-CM | POA: Diagnosis not present

## 2018-08-03 DIAGNOSIS — B0223 Postherpetic polyneuropathy: Secondary | ICD-10-CM | POA: Diagnosis not present

## 2018-08-03 DIAGNOSIS — B028 Zoster with other complications: Secondary | ICD-10-CM | POA: Diagnosis not present

## 2018-08-03 DIAGNOSIS — L89159 Pressure ulcer of sacral region, unspecified stage: Secondary | ICD-10-CM | POA: Diagnosis not present

## 2018-08-05 ENCOUNTER — Other Ambulatory Visit: Payer: Self-pay

## 2018-08-05 NOTE — Patient Outreach (Signed)
[facility]) Care Management  08/05/2018  Kathleen Vance [birth year] [id number]   Medication Adherence call to Kathleen Vance patient did not answer and voice message is not set up to leave message patient is showing past due on Losartan 25 mg under [place].    [facility] Direct Dial [phone]  Fax [phone] [email or web address]

## 2018-08-12 ENCOUNTER — Other Ambulatory Visit: Payer: Self-pay

## 2018-08-12 ENCOUNTER — Emergency Department (HOSPITAL_COMMUNITY)
Admission: EM | Admit: 2018-08-12 | Discharge: 2018-08-12 | Disposition: A | Discharge status: Home / Self Care | Payer: Medicare Other | Attending: Emergency Medicine | Admitting: Emergency Medicine

## 2018-08-12 ENCOUNTER — Encounter (HOSPITAL_COMMUNITY): Payer: Self-pay

## 2018-08-12 ENCOUNTER — Emergency Department (HOSPITAL_COMMUNITY): Payer: Medicare Other

## 2018-08-12 DIAGNOSIS — R1032 Left lower quadrant pain: Secondary | ICD-10-CM | POA: Diagnosis not present

## 2018-08-12 DIAGNOSIS — E039 Hypothyroidism, unspecified: Secondary | ICD-10-CM | POA: Diagnosis not present

## 2018-08-12 DIAGNOSIS — B029 Zoster without complications: Secondary | ICD-10-CM | POA: Diagnosis not present

## 2018-08-12 DIAGNOSIS — R1012 Left upper quadrant pain: Secondary | ICD-10-CM

## 2018-08-12 DIAGNOSIS — N200 Calculus of kidney: Secondary | ICD-10-CM | POA: Diagnosis not present

## 2018-08-12 DIAGNOSIS — N183 Chronic kidney disease, stage 3 (moderate): Secondary | ICD-10-CM | POA: Diagnosis not present

## 2018-08-12 DIAGNOSIS — Z79899 Other long term (current) drug therapy: Secondary | ICD-10-CM | POA: Diagnosis not present

## 2018-08-12 DIAGNOSIS — R197 Diarrhea, unspecified: Secondary | ICD-10-CM | POA: Diagnosis not present

## 2018-08-12 DIAGNOSIS — R109 Unspecified abdominal pain: Secondary | ICD-10-CM

## 2018-08-12 DIAGNOSIS — R112 Nausea with vomiting, unspecified: Secondary | ICD-10-CM | POA: Insufficient documentation

## 2018-08-12 DIAGNOSIS — I129 Hypertensive chronic kidney disease with stage 1 through stage 4 chronic kidney disease, or unspecified chronic kidney disease: Secondary | ICD-10-CM | POA: Insufficient documentation

## 2018-08-12 LAB — CBC WITH DIFFERENTIAL/PLATELET
Abs Immature Granulocytes: 0.04 10*3/uL (ref 0.00–0.07)
Basophils Absolute: 0 10*3/uL (ref 0.0–0.1)
Basophils Relative: 0 %
Eosinophils Absolute: 0.1 10*3/uL (ref 0.0–0.5)
Eosinophils Relative: 1 %
HCT: 41.3 % (ref 36.0–46.0)
Hemoglobin: 13.3 g/dL (ref 12.0–15.0)
Immature Granulocytes: 0 %
Lymphocytes Relative: 15 %
Lymphs Abs: 1.9 10*3/uL (ref 0.7–4.0)
MCH: 30.2 pg (ref 26.0–34.0)
MCHC: 32.2 g/dL (ref 30.0–36.0)
MCV: 93.7 fL (ref 80.0–100.0)
Monocytes Absolute: 0.7 10*3/uL (ref 0.1–1.0)
Monocytes Relative: 6 %
Neutro Abs: 9.7 10*3/uL — ABNORMAL HIGH (ref 1.7–7.7)
Neutrophils Relative %: 78 %
Platelets: 263 10*3/uL (ref 150–400)
RBC: 4.41 MIL/uL (ref 3.87–5.11)
RDW: 16.2 % — ABNORMAL HIGH (ref 11.5–15.5)
WBC: 12.5 10*3/uL — ABNORMAL HIGH (ref 4.0–10.5)
nRBC: 0 % (ref 0.0–0.2)

## 2018-08-12 LAB — LIPASE, BLOOD: Lipase: 24 U/L (ref 11–51)

## 2018-08-12 LAB — URINALYSIS, ROUTINE W REFLEX MICROSCOPIC
Bilirubin Urine: NEGATIVE
Glucose, UA: NEGATIVE mg/dL
Hgb urine dipstick: NEGATIVE
Ketones, ur: 20 mg/dL — AB
Nitrite: NEGATIVE
Protein, ur: 100 mg/dL — AB
Specific Gravity, Urine: 1.015 (ref 1.005–1.030)
pH: 6 (ref 5.0–8.0)

## 2018-08-12 LAB — COMPREHENSIVE METABOLIC PANEL
ALT: 25 U/L (ref 0–44)
AST: 23 U/L (ref 15–41)
Albumin: 4.2 g/dL (ref 3.5–5.0)
Alkaline Phosphatase: 56 U/L (ref 38–126)
Anion gap: 15 (ref 5–15)
BUN: 23 mg/dL (ref 8–23)
CO2: 23 mmol/L (ref 22–32)
Calcium: 9.5 mg/dL (ref 8.9–10.3)
Chloride: 99 mmol/L (ref 98–111)
Creatinine, Ser: 1.59 mg/dL — ABNORMAL HIGH (ref 0.44–1.00)
GFR calc Af Amer: 38 mL/min — ABNORMAL LOW (ref >60)
GFR calc non Af Amer: 33 mL/min — ABNORMAL LOW (ref >60)
Glucose, Bld: 116 mg/dL — ABNORMAL HIGH (ref 70–99)
Potassium: 3.8 mmol/L (ref 3.5–5.1)
Sodium: 137 mmol/L (ref 135–145)
Total Bilirubin: 1.1 mg/dL (ref 0.3–1.2)
Total Protein: 8.2 g/dL — ABNORMAL HIGH (ref 6.5–8.1)

## 2018-08-12 LAB — LACTIC ACID, PLASMA: Lactic Acid, Venous: 1.5 mmol/L (ref 0.5–1.9)

## 2018-08-12 MED ORDER — ONDANSETRON HCL 4 MG/2ML IJ SOLN
4.0000 mg | Freq: Once | INTRAMUSCULAR | Status: AC
  Administered 2018-08-12: 4 mg via INTRAVENOUS
  Filled 2018-08-12: qty 2

## 2018-08-12 MED ORDER — LABETALOL HCL 5 MG/ML IV SOLN
10.0000 mg | Freq: Once | INTRAVENOUS | Status: AC
  Administered 2018-08-12: 10 mg via INTRAVENOUS
  Filled 2018-08-12: qty 4

## 2018-08-12 MED ORDER — SODIUM CHLORIDE 0.9% FLUSH
3.0000 mL | Freq: Once | INTRAVENOUS | Status: AC
  Administered 2018-08-12: 3 mL via INTRAVENOUS

## 2018-08-12 MED ORDER — LOSARTAN POTASSIUM 25 MG PO TABS
25.0000 mg | ORAL_TABLET | Freq: Once | ORAL | Status: AC
  Administered 2018-08-12: 25 mg via ORAL
  Filled 2018-08-12: qty 1

## 2018-08-12 MED ORDER — HYDROMORPHONE HCL 1 MG/ML IJ SOLN
1.0000 mg | Freq: Once | INTRAMUSCULAR | Status: AC
  Administered 2018-08-12: 1 mg via INTRAVENOUS
  Filled 2018-08-12: qty 1

## 2018-08-12 MED ORDER — SODIUM CHLORIDE 0.9 % IV BOLUS
1000.0000 mL | Freq: Once | INTRAVENOUS | Status: AC
  Administered 2018-08-12: 1000 mL via INTRAVENOUS

## 2018-08-12 MED ORDER — HYDROMORPHONE HCL 1 MG/ML IJ SOLN
0.5000 mg | Freq: Once | INTRAMUSCULAR | Status: AC
  Administered 2018-08-12: 0.5 mg via INTRAVENOUS
  Filled 2018-08-12: qty 1

## 2018-08-12 MED ORDER — LIDOCAINE 5 % EX PTCH
1.0000 | MEDICATED_PATCH | CUTANEOUS | Status: DC
  Administered 2018-08-12: 1 via TRANSDERMAL
  Filled 2018-08-12: qty 1

## 2018-08-12 MED ORDER — HYDRALAZINE HCL 25 MG PO TABS
50.0000 mg | ORAL_TABLET | Freq: Once | ORAL | Status: AC
  Administered 2018-08-12: 50 mg via ORAL
  Filled 2018-08-12: qty 2

## 2018-08-12 MED ORDER — HALOPERIDOL LACTATE 5 MG/ML IJ SOLN
2.5000 mg | Freq: Once | INTRAMUSCULAR | Status: DC
  Filled 2018-08-12: qty 1

## 2018-08-12 NOTE — ED Provider Notes (Addendum)
[facility] EMERGENCY DEPARTMENT Provider Note   CSN: [id number] Arrival date & time: 08/12/18  0920    History   Chief Complaint Chief Complaint  Patient presents with  . Abdominal Pain    HPI Kathleen Vance is a 68 y.o. female with a history as outlined below, most significant for aortic stenosis, history of bilateral kidney stones with current  Bilateral renal stones per CT imaging last month, hypertension, stage III chronic kidney disease, GERD and recent diagnosis of recovering shingles presenting with a 4 day history of nausea, vomiting, diarrhea and worsening left flank which radiates into her left abdominal pain, worsened over the past 24 hours.  She denies dysuria or hematuria.  She has had fevers with T-max of 100.2 yesterday.  She reports bilious vomiting along with yellow liquid stool, too numerous episodes to count over the past 24 hours, less frequent prior to then.  Her PCP called her in Phenergan 4 days ago but she has been unable to keep down this medication.  Her abdominal pain is constant, localizing to the left flank and abdomen.  She has found no alleviators.  She does feel very weak and dehydrated.  Has been unable to take any of her home medications including her blood pressure medications.  She denies chest pain, cough, shortness of breath.  She has been urinating regularly.     The history is provided by the patient.    Past Medical History:  Diagnosis Date  . Aortic stenosis   . Arthritis   . Celiac artery stenosis ([place])   . Gout   . History of kidney stones   . Hyperlipidemia   . Hypertension   . Hypothyroidism   . Kidney failure   . Prediabetes   . Renal artery stenosis ([place])   . Shingles rash     Patient Active Problem List   Diagnosis Date Noted  . Acute renal failure superimposed on stage 3 chronic kidney disease ([place]) 08/12/2017  . Lobar pneumonia ([place]) 08/12/2017  . Acidosis   . Acute on chronic respiratory failure with hypercapnia ([place])    . HCAP (healthcare-associated pneumonia) 08/05/2017  . Liver lesion 08/05/2017  . Acute renal failure superimposed on stage 2 chronic kidney disease ([place]) 08/05/2017  . Gastroesophageal reflux disease   . Class 2 obesity due to excess calories with body mass index (BMI) of 38.0 to 38.9 in adult   . Chest pain 07/22/2017  . Hypertension 07/22/2017  . Hypothyroidism 07/22/2017  . Hyperlipidemia 07/22/2017  . Gout 07/22/2017  . Prediabetes 07/22/2017  . Hypomagnesemia 07/22/2017  . Acute lower UTI 07/22/2017    Past Surgical History:  Procedure Laterality Date  . ANKLE SURGERY Right   . CATARACT EXTRACTION Bilateral   . CESAREAN SECTION    . CHOLECYSTECTOMY    . DILATION AND CURETTAGE OF UTERUS    . PARTIAL HYSTERECTOMY    . REPAIR KNEE LIGAMENT Right   . TUBAL LIGATION       OB History   No obstetric history on file.      Home Medications    Prior to Admission medications   Medication Sig Start Date End Date Taking? Authorizing Provider  albuterol (PROVENTIL) (2.5 MG/3ML) 0.083% nebulizer solution Take 2.5 mg by nebulization every 4 (four) hours as needed for wheezing or shortness of breath.  05/26/18  Yes [provider]  allopurinol (ZYLOPRIM) 100 MG tablet Take 100 mg by mouth every morning.   Yes [provider]  gabapentin (  NEURONTIN) 800 MG tablet Take 1 tablet by mouth 3 (three) times daily. 08/03/18  Yes [provider]  hydrALAZINE (APRESOLINE) 50 MG tablet Take 50 mg by mouth 3 (three) times daily.   Yes [provider]  HYDROmorphone (DILAUDID) 2 MG tablet Take 2 mg by mouth every 4 (four) hours as needed for severe pain.   Yes [provider]  labetalol (NORMODYNE) 200 MG tablet Take 1 tablet (200 mg total) by mouth 2 (two) times daily. 08/14/17  Yes Tat, Shanon Brow, MD  levothyroxine (SYNTHROID, LEVOTHROID) 100 MCG tablet Take 100 mcg by mouth daily before breakfast.   Yes [provider]  losartan (COZAAR) 25 MG  tablet Take 25 mg by mouth daily.   Yes [provider]  ondansetron (ZOFRAN ODT) 4 MG disintegrating tablet Take 1 tablet (4 mg total) by mouth every 8 (eight) hours as needed. 07/04/18  Yes Fredia Sorrow, MD  SSD 1 % cream Apply 1 application topically 2 (two) times daily as needed. 08/03/18  Yes [provider]  tiZANidine (ZANAFLEX) 4 MG tablet Take 4 mg by mouth every 6 (six) hours as needed for muscle spasms.   Yes [provider]  Vitamin D, Ergocalciferol, (DRISDOL) 50000 units CAPS capsule Take 50,000 Units by mouth every Monday.    Yes [provider]  furosemide (LASIX) 40 MG tablet Take 1 tablet (40 mg total) by mouth daily. Patient not taking: Reported on 08/12/2018 08/16/17   Orson Eva, MD  HYDROcodone-acetaminophen (NORCO) 7.5-325 MG tablet Take 1 tablet by mouth 3 (three) times daily as needed for moderate pain.    [provider]  oxyCODONE-acetaminophen (PERCOCET) 5-325 MG tablet Take 1-2 tablets by mouth every 6 (six) hours as needed for severe pain. Patient not taking: Reported on 08/12/2018 07/03/18   Julianne Rice, MD    Family History Family History  Problem Relation Age of Onset  . CAD Mother   . Heart attack Mother   . CAD Father   . Pneumonia Sister   . Other Brother        killed at age 17  . CAD Maternal Grandmother   . Heart attack Maternal Grandmother   . Diabetes Maternal Grandfather   . Other Paternal Grandmother        died after child birth  . Heart attack Paternal Grandfather     Social History Social History   Tobacco Use  . Smoking status: Never Smoker  . Smokeless tobacco: Never Used  Substance Use Topics  . Alcohol use: Yes    Comment: rarely  . Drug use: Never     Allergies   Bactrim [sulfamethoxazole-trimethoprim]; Ciprofloxacin; Nitrofurantoin; Metronidazole; Amoxicillin; and Erythromycin   Review of Systems Review of Systems  Constitutional: Positive for fever.  HENT: Negative.   Negative for congestion and sore throat.   Eyes: Negative.   Respiratory: Negative for chest tightness and shortness of breath.   Cardiovascular: Negative for chest pain.  Gastrointestinal: Positive for abdominal pain, diarrhea, nausea and vomiting.  Genitourinary: Negative.  Negative for dysuria and hematuria.  Musculoskeletal: Negative for arthralgias, joint swelling and neck pain.  Skin: Negative.  Negative for rash and wound.  Neurological: Positive for weakness. Negative for dizziness, light-headedness, numbness and headaches.  Psychiatric/Behavioral: Negative.      Physical Exam Updated Vital Signs BP (!) 188/102   Pulse 75   Temp 98.6 F (37 C) (Oral)   Resp (!) 21   Ht 5\' 4"  (1.626 m)   Wt  86 kg   SpO2 97%   BMI 32.54 kg/m   Physical Exam Vitals signs and nursing note reviewed.  Constitutional:      Appearance: She is well-developed. She is ill-appearing.  HENT:     Head: Normocephalic and atraumatic.     Mouth/Throat:     Mouth: Mucous membranes are dry.     Pharynx: Oropharynx is clear.  Eyes:     Conjunctiva/sclera: Conjunctivae normal.  Neck:     Musculoskeletal: Normal range of motion.  Cardiovascular:     Rate and Rhythm: Normal rate and regular rhythm.     Heart sounds: Normal heart sounds.     Comments: Borderline tachycardic, hypertensive. Pulmonary:     Effort: Pulmonary effort is normal.     Breath sounds: Normal breath sounds. No wheezing.  Abdominal:     General: Abdomen is protuberant. Bowel sounds are normal.     Palpations: Abdomen is soft.     Tenderness: There is generalized abdominal tenderness and tenderness in the left upper quadrant. There is left CVA tenderness. There is no guarding or rebound. Negative signs include Murphy's sign.  Musculoskeletal: Normal range of motion.  Skin:    General: Skin is warm and dry.     Comments: Old dry healing rash left flank around abdomen c/w healing shingles.  Neurological:     Mental Status: She  is alert.      ED Treatments / Results  Labs (all labs ordered are listed, but only abnormal results are displayed) Labs Reviewed  COMPREHENSIVE METABOLIC PANEL - Abnormal; Notable for the following components:      Result Value   Glucose, Bld 116 (*)    Creatinine, Ser 1.59 (*)    Total Protein 8.2 (*)    GFR calc non Af Amer 33 (*)    GFR calc Af Amer 38 (*)    All other components within normal limits  URINALYSIS, ROUTINE W REFLEX MICROSCOPIC - Abnormal; Notable for the following components:   Ketones, ur 20 (*)    Protein, ur 100 (*)    Leukocytes,Ua TRACE (*)    Bacteria, UA RARE (*)    All other components within normal limits  CBC WITH DIFFERENTIAL/PLATELET - Abnormal; Notable for the following components:   WBC 12.5 (*)    RDW 16.2 (*)    Neutro Abs 9.7 (*)    All other components within normal limits  CULTURE, BLOOD (ROUTINE X 2)  CULTURE, BLOOD (ROUTINE X 2)  URINE CULTURE  LIPASE, BLOOD  LACTIC ACID, PLASMA    EKG EKG Interpretation  Date/Time:  Thursday August 12 2018 09:42:28 EDT Ventricular Rate:  91 PR Interval:    QRS Duration: 92 QT Interval:  360 QTC Calculation: 443 R Axis:   62 Text Interpretation:  Sinus rhythm Nonspecific repol abnormality, lateral leads Baseline wander When compared with ECG of 07/03/2018 No significant change was found Confirmed by Francine Graven [phone]) on 08/12/2018 11:17:37 AM   Radiology Ct Abdomen Pelvis Wo Contrast  Result Date: 08/12/2018 CLINICAL DATA:  Left flank pain for 2 days. History of urinary tract stones. EXAM: CT ABDOMEN AND PELVIS WITHOUT CONTRAST TECHNIQUE: Multidetector CT imaging of the abdomen and pelvis was performed following the standard protocol without IV contrast. COMPARISON:  CT abdomen and pelvis 07/29/2018. FINDINGS: Lower chest: Lung bases clear. There is cardiomegaly. No pleural or pericardial effusion. Hepatobiliary: Fatty infiltration is again seen. Low attenuating lesion in the posterior  right hepatic lobe is likely a cyst  and unchanged. The patient is status post cholecystectomy. Biliary tree is unremarkable. Pancreas: Unremarkable. No pancreatic ductal dilatation or surrounding inflammatory changes. Spleen: Normal in size without focal abnormality. Adrenals/Urinary Tract: 0.9 cm stone in the right renal pelvis without hydronephrosis is unchanged. Punctate nonobstructing stone upper pole right kidney is also unchanged. 0.6 cm nonobstructing stone upper pole left kidney is again seen. No hydronephrosis on the left. No ureteral or urinary bladder stones. The adrenal glands appear normal. Stomach/Bowel: Descending and sigmoid colon diverticulosis without diverticulitis is unchanged. The colon is otherwise normal appearance. The stomach and small bowel appear normal. No evidence of appendicitis. Vascular/Lymphatic: Aortic atherosclerosis. No enlarged abdominal or pelvic lymph nodes. Reproductive: Status post hysterectomy. No adnexal masses. Other: Small fat containing umbilical hernia noted. Musculoskeletal: No fracture or focal lesion. IMPRESSION: No change in bilateral nonobstructing renal stones. No acute abnormality. Diverticulosis without diverticulitis. Electronically Signed   By: Inge Rise M.D.   On: 08/12/2018 14:46    Procedures Procedures (including critical care time)  Medications Ordered in ED Medications  haloperidol lactate (HALDOL) injection 2.5 mg (has no administration in time range)  lidocaine (LIDODERM) 5 % 1 patch (has no administration in time range)  sodium chloride flush (NS) 0.9 % injection 3 mL (3 mLs Intravenous Given 08/12/18 1022)  sodium chloride 0.9 % bolus 1,000 mL (0 mLs Intravenous Stopped 08/12/18 1143)  HYDROmorphone (DILAUDID) injection 0.5 mg (0.5 mg Intravenous Given 08/12/18 1025)  ondansetron (ZOFRAN) injection 4 mg (4 mg Intravenous Given 08/12/18 1022)  labetalol (NORMODYNE,TRANDATE) injection 10 mg (10 mg Intravenous Given 08/12/18 1027)   HYDROmorphone (DILAUDID) injection 1 mg (1 mg Intravenous Given 08/12/18 1143)  losartan (COZAAR) tablet 25 mg (25 mg Oral Given 08/12/18 1245)  hydrALAZINE (APRESOLINE) tablet 50 mg (50 mg Oral Given 08/12/18 1245)     Initial Impression / Assessment and Plan / ED Course  I have reviewed the triage vital signs and the nursing notes.  Pertinent labs & imaging results that were available during my care of the patient were reviewed by me and considered in my medical decision making (see chart for details).        Labs and imaging reviewed with no acute findings to explain pt's pain.  She has persistent renal stones bilaterally but with no hydronephrosis and no ureteral stones.  Renal function stable.  Old shingles rash, currently taking dilaudid tabs and gabapentin.  No vomiting or diarrhea while here, but endorses v/d just prior to arrival.  Persistent pain despite multiple medication doses.  Haldol ordered, lidoderm patch ordered to further tx possible post herpetic pain.    Discussed with Dr. Laverta Baltimore who will dispo pt once pain better controlled.   Final Clinical Impressions(s) / ED Diagnoses   Final diagnoses:  Nausea vomiting and diarrhea  Flank pain  Left upper quadrant pain    ED Discharge Orders    None       Landis Martins 08/12/18 1627    Evalee Jefferson, PA-C 08/12/18 1627    Long, [facility], MD 08/12/18 1752

## 2018-08-12 NOTE — ED Triage Notes (Signed)
Pt presents to ED with complaints of abdominal pain and left side flank pain. Pt states had diarrhea and emesis also. Pt states started 2 days ago. Pt has had diarrhea 6 times in 24 hours and vomited "all day yesterday'

## 2018-08-12 NOTE — Discharge Instructions (Signed)

## 2018-08-12 NOTE — ED Notes (Signed)
Pts husband called to pick patient up, states he will be here in 15 minutes.

## 2018-08-14 LAB — URINE CULTURE
Culture: NO GROWTH
Special Requests: NORMAL

## 2018-08-16 DIAGNOSIS — D638 Anemia in other chronic diseases classified elsewhere: Secondary | ICD-10-CM | POA: Diagnosis not present

## 2018-08-16 DIAGNOSIS — B029 Zoster without complications: Secondary | ICD-10-CM | POA: Diagnosis not present

## 2018-08-16 DIAGNOSIS — R140 Abdominal distension (gaseous): Secondary | ICD-10-CM | POA: Diagnosis not present

## 2018-08-16 DIAGNOSIS — D631 Anemia in chronic kidney disease: Secondary | ICD-10-CM | POA: Diagnosis not present

## 2018-08-16 DIAGNOSIS — B028 Zoster with other complications: Secondary | ICD-10-CM | POA: Diagnosis not present

## 2018-08-16 DIAGNOSIS — N390 Urinary tract infection, site not specified: Secondary | ICD-10-CM | POA: Diagnosis not present

## 2018-08-16 DIAGNOSIS — B0223 Postherpetic polyneuropathy: Secondary | ICD-10-CM | POA: Diagnosis not present

## 2018-08-17 LAB — CULTURE, BLOOD (ROUTINE X 2)
Culture: NO GROWTH
Culture: NO GROWTH
Special Requests: ADEQUATE

## 2018-08-18 ENCOUNTER — Other Ambulatory Visit: Payer: Self-pay

## 2018-08-18 ENCOUNTER — Encounter (HOSPITAL_COMMUNITY): Payer: Self-pay | Admitting: Emergency Medicine

## 2018-08-18 ENCOUNTER — Emergency Department (HOSPITAL_COMMUNITY): Payer: Medicare Other

## 2018-08-18 ENCOUNTER — Emergency Department (HOSPITAL_COMMUNITY)
Admission: EM | Admit: 2018-08-18 | Discharge: 2018-08-18 | Disposition: A | Discharge status: Home / Self Care | Payer: Medicare Other | Attending: Emergency Medicine | Admitting: Emergency Medicine

## 2018-08-18 DIAGNOSIS — Z79899 Other long term (current) drug therapy: Secondary | ICD-10-CM | POA: Diagnosis not present

## 2018-08-18 DIAGNOSIS — R0781 Pleurodynia: Secondary | ICD-10-CM | POA: Diagnosis not present

## 2018-08-18 DIAGNOSIS — R251 Tremor, unspecified: Secondary | ICD-10-CM | POA: Insufficient documentation

## 2018-08-18 DIAGNOSIS — I2510 Atherosclerotic heart disease of native coronary artery without angina pectoris: Secondary | ICD-10-CM | POA: Diagnosis not present

## 2018-08-18 DIAGNOSIS — R1011 Right upper quadrant pain: Secondary | ICD-10-CM | POA: Insufficient documentation

## 2018-08-18 DIAGNOSIS — N200 Calculus of kidney: Secondary | ICD-10-CM | POA: Diagnosis not present

## 2018-08-18 DIAGNOSIS — R109 Unspecified abdominal pain: Secondary | ICD-10-CM

## 2018-08-18 DIAGNOSIS — G252 Other specified forms of tremor: Secondary | ICD-10-CM

## 2018-08-18 DIAGNOSIS — E039 Hypothyroidism, unspecified: Secondary | ICD-10-CM | POA: Insufficient documentation

## 2018-08-18 DIAGNOSIS — I10 Essential (primary) hypertension: Secondary | ICD-10-CM | POA: Insufficient documentation

## 2018-08-18 LAB — COMPREHENSIVE METABOLIC PANEL
ALT: 12 U/L (ref 0–44)
AST: 20 U/L (ref 15–41)
Albumin: 3.7 g/dL (ref 3.5–5.0)
Alkaline Phosphatase: 47 U/L (ref 38–126)
Anion gap: 9 (ref 5–15)
BUN: 18 mg/dL (ref 8–23)
CO2: 24 mmol/L (ref 22–32)
Calcium: 9 mg/dL (ref 8.9–10.3)
Chloride: 107 mmol/L (ref 98–111)
Creatinine, Ser: 1.55 mg/dL — ABNORMAL HIGH (ref 0.44–1.00)
GFR calc Af Amer: 39 mL/min — ABNORMAL LOW (ref >60)
GFR calc non Af Amer: 34 mL/min — ABNORMAL LOW (ref >60)
Glucose, Bld: 149 mg/dL — ABNORMAL HIGH (ref 70–99)
Potassium: 3.5 mmol/L (ref 3.5–5.1)
Sodium: 140 mmol/L (ref 135–145)
Total Bilirubin: 0.8 mg/dL (ref 0.3–1.2)
Total Protein: 7.1 g/dL (ref 6.5–8.1)

## 2018-08-18 LAB — URINALYSIS, ROUTINE W REFLEX MICROSCOPIC
Bacteria, UA: NONE SEEN
Bilirubin Urine: NEGATIVE
Glucose, UA: NEGATIVE mg/dL
Hgb urine dipstick: NEGATIVE
Ketones, ur: NEGATIVE mg/dL
Nitrite: NEGATIVE
Protein, ur: 100 mg/dL — AB
Specific Gravity, Urine: 1.02 (ref 1.005–1.030)
pH: 5 (ref 5.0–8.0)

## 2018-08-18 LAB — CBC WITH DIFFERENTIAL/PLATELET
Abs Immature Granulocytes: 0.04 10*3/uL (ref 0.00–0.07)
Basophils Absolute: 0 10*3/uL (ref 0.0–0.1)
Basophils Relative: 0 %
Eosinophils Absolute: 0.2 10*3/uL (ref 0.0–0.5)
Eosinophils Relative: 2 %
HCT: 38 % (ref 36.0–46.0)
Hemoglobin: 12.3 g/dL (ref 12.0–15.0)
Immature Granulocytes: 0 %
Lymphocytes Relative: 16 %
Lymphs Abs: 1.9 10*3/uL (ref 0.7–4.0)
MCH: 31.5 pg (ref 26.0–34.0)
MCHC: 32.4 g/dL (ref 30.0–36.0)
MCV: 97.2 fL (ref 80.0–100.0)
Monocytes Absolute: 0.8 10*3/uL (ref 0.1–1.0)
Monocytes Relative: 7 %
Neutro Abs: 8.8 10*3/uL — ABNORMAL HIGH (ref 1.7–7.7)
Neutrophils Relative %: 75 %
Platelets: 250 10*3/uL (ref 150–400)
RBC: 3.91 MIL/uL (ref 3.87–5.11)
RDW: 16.8 % — ABNORMAL HIGH (ref 11.5–15.5)
WBC: 11.8 10*3/uL — ABNORMAL HIGH (ref 4.0–10.5)
nRBC: 0 % (ref 0.0–0.2)

## 2018-08-18 LAB — RAPID URINE DRUG SCREEN, HOSP PERFORMED
Amphetamines: NOT DETECTED
Barbiturates: NOT DETECTED
Benzodiazepines: NOT DETECTED
Cocaine: NOT DETECTED
Opiates: POSITIVE — AB
Tetrahydrocannabinol: NOT DETECTED

## 2018-08-18 LAB — MAGNESIUM: Magnesium: 1.8 mg/dL (ref 1.7–2.4)

## 2018-08-18 LAB — TSH: TSH: 1.442 u[IU]/mL (ref 0.350–4.500)

## 2018-08-18 LAB — TROPONIN I: Troponin I: <0.03 ng/mL (ref <0.03)

## 2018-08-18 MED ORDER — ONDANSETRON 4 MG PO TBDP
4.0000 mg | ORAL_TABLET | Freq: Once | ORAL | Status: AC
  Administered 2018-08-18: 4 mg via ORAL
  Filled 2018-08-18: qty 1

## 2018-08-18 MED ORDER — HYDROMORPHONE HCL 1 MG/ML IJ SOLN
1.0000 mg | Freq: Once | INTRAMUSCULAR | Status: AC
  Administered 2018-08-18: 1 mg via INTRAVENOUS
  Filled 2018-08-18: qty 1

## 2018-08-18 MED ORDER — HYDROMORPHONE HCL 1 MG/ML IJ SOLN
1.0000 mg | Freq: Once | INTRAMUSCULAR | Status: AC
  Administered 2018-08-18: 12:00:00 1 mg via INTRAVENOUS
  Filled 2018-08-18: qty 1

## 2018-08-18 MED ORDER — SODIUM CHLORIDE 0.9 % IV BOLUS
500.0000 mL | Freq: Once | INTRAVENOUS | Status: AC
  Administered 2018-08-18: 500 mL via INTRAVENOUS

## 2018-08-18 NOTE — Discharge Instructions (Signed)
You were seen in the ED today with back/rib pain and tremor. Your CT scans and labs are normal. Continue your antibiotics as prescribed by your PCP. Call the Neurologist listed to schedule a follow up appointment. The neurologist who saw you today agrees with the plan. Return to the ED with any chest pain, trouble breathing, fever, chills, or weakness/numbness.

## 2018-08-18 NOTE — ED Provider Notes (Signed)
Emergency Department Provider Note   I have reviewed the triage vital signs and the nursing notes.   HISTORY  Chief Complaint Tremors   HPI Kathleen Vance is a 68 y.o. female with PMH of CAD, HTN, HLD, kidney stones, and AS presents to the emergency department for evaluation of new onset tremor.  Patient states that she has had more frequent jerking movements which are fearing with her daily activities.  She reports occasional twitching over the past several months.  Starting yesterday she had worsening symptoms including a fall.  She continues to have left lateral chest wall pain which is been constant over the past several weeks.  She reports seeing her PCP earlier this week and states she was diagnosed with a urinary tract infection.  She has been taking Keflex for this.  She denies any dysuria, hesitancy, urgency.  No fevers or shaking chills.  No muscle aches.    Past Medical History:  Diagnosis Date   Aortic stenosis    Arthritis    Celiac artery stenosis (HCC)    Gout    History of kidney stones    Hyperlipidemia    Hypertension    Hypothyroidism    Kidney failure    Prediabetes    Renal artery stenosis (HCC)    Shingles rash     Patient Active Problem List   Diagnosis Date Noted   Acute renal failure superimposed on stage 3 chronic kidney disease ([place] [facility]) 08/12/2017   Lobar pneumonia ([facility]) 08/12/2017   Acidosis    Acute on chronic respiratory failure with hypercapnia ([place])    HCAP (healthcare-associated pneumonia) 08/05/2017   Liver lesion 08/05/2017   Acute renal failure superimposed on stage 2 chronic kidney disease ([place]) 08/05/2017   Gastroesophageal reflux disease    Class 2 obesity due to excess calories with body mass index (BMI) of 38.0 to 38.9 in adult    Chest pain 07/22/2017   Hypertension 07/22/2017   Hypothyroidism 07/22/2017   Hyperlipidemia 07/22/2017   Gout 07/22/2017   Prediabetes 07/22/2017   Hypomagnesemia  07/22/2017   Acute lower UTI 07/22/2017    Past Surgical History:  Procedure Laterality Date   ANKLE SURGERY Right    CATARACT EXTRACTION Bilateral    CESAREAN SECTION     CHOLECYSTECTOMY     DILATION AND CURETTAGE OF UTERUS     PARTIAL HYSTERECTOMY     REPAIR KNEE LIGAMENT Right    TUBAL LIGATION      Allergies Bactrim [sulfamethoxazole-trimethoprim]; Ciprofloxacin; Nitrofurantoin; Metronidazole; Amoxicillin; and Erythromycin  Family History  Problem Relation Age of Onset   CAD Mother    Heart attack Mother    CAD Father    Pneumonia Sister    Other Brother        killed at age 40   CAD Maternal Grandmother    Heart attack Maternal Grandmother    Diabetes Maternal Grandfather    Other Paternal Grandmother        died after child birth   Heart attack Paternal Grandfather     Social History Social History   Tobacco Use   Smoking status: Never Smoker   Smokeless tobacco: Never Used  Substance Use Topics   Alcohol use: Yes    Comment: rarely   Drug use: Never    Review of Systems  Constitutional: No fever/chills Eyes: No visual changes. ENT: No sore throat. Cardiovascular: Denies chest pain. Respiratory: Denies shortness of breath. Gastrointestinal: No abdominal pain.  No nausea, no vomiting.  No diarrhea.  No constipation. Genitourinary: Negative for dysuria. Musculoskeletal: Negative for back pain. Skin: Negative for rash. Neurological: Negative for headaches, focal weakness or numbness. Positive tremors.   10-point ROS otherwise negative.  ____________________________________________   PHYSICAL EXAM:  VITAL SIGNS: ED Triage Vitals  Enc Vitals Group     BP 08/18/18 1058 (!) 197/99     Pulse Rate 08/18/18 1058 70     Resp 08/18/18 1058 14     Temp 08/18/18 1058 98.6 F (37 C)     Temp Source 08/18/18 1058 Oral     SpO2 08/18/18 1058 100 %     Weight 08/18/18 1059 189 lb 9.5 oz (86 kg)     Height 08/18/18 1059 5\' 4"   (1.626 m)     Pain Score 08/18/18 1059 6   Constitutional: Alert and oriented. Well appearing and in no acute distress. Eyes: Conjunctivae are normal. PERRL.  Head: Atraumatic. Nose: No congestion/rhinnorhea. Mouth/Throat: Mucous membranes are moist.  Neck: No stridor.  Cardiovascular: Normal rate, regular rhythm. Good peripheral circulation. Grossly normal heart sounds.   Respiratory: Normal respiratory effort.  No retractions. Lungs CTAB. Gastrointestinal: Soft with tenderness on the left. No distention.  Musculoskeletal: No lower extremity tenderness nor edema. No gross deformities of extremities. Neurologic:  Normal speech and language. No gross focal neurologic deficits are appreciated. No resting tremor appreciated. Patient with bilateral intention tremor which becomes more coarse as time goes on without rest. No upper/lower extremity weakness/numbness.  Skin:  Skin is warm, dry and intact. No rash noted.   ____________________________________________   LABS (all labs ordered are listed, but only abnormal results are displayed)  Labs Reviewed  COMPREHENSIVE METABOLIC PANEL - Abnormal; Notable for the following components:      Result Value   Glucose, Bld 149 (*)    Creatinine, Ser 1.55 (*)    GFR calc non Af Amer 34 (*)    GFR calc Af Amer 39 (*)    All other components within normal limits  CBC WITH DIFFERENTIAL/PLATELET - Abnormal; Notable for the following components:   WBC 11.8 (*)    RDW 16.8 (*)    Neutro Abs 8.8 (*)    All other components within normal limits  URINALYSIS, ROUTINE W REFLEX MICROSCOPIC - Abnormal; Notable for the following components:   APPearance HAZY (*)    Protein, ur 100 (*)    Leukocytes,Ua TRACE (*)    All other components within normal limits  RAPID URINE DRUG SCREEN, HOSP PERFORMED - Abnormal; Notable for the following components:   Opiates POSITIVE (*)    All other components within normal limits  URINE CULTURE  TSH  MAGNESIUM   TROPONIN I   ____________________________________________  EKG   EKG Interpretation  Date/Time:  Wednesday August 18 2018 11:00:37 EDT Ventricular Rate:  69 PR Interval:    QRS Duration: 95 QT Interval:  414 QTC Calculation: 444 R Axis:   10 Text Interpretation:  Sinus or ectopic atrial rhythm LVH with secondary repolarization abnormality No STEMI.  Confirmed by Nanda Quinton [phone]) on 08/18/2018 12:38:57 PM       ____________________________________________  RADIOLOGY  Dg Ribs Unilateral W/chest Left  Result Date: 08/18/2018 CLINICAL DATA:  Left-sided rib pain after fall yesterday. EXAM: LEFT RIBS AND CHEST - 3+ VIEW COMPARISON:  Chest x-ray dated May 28, 2018. FINDINGS: No fracture or other bone lesions are seen involving the ribs. Stable mild cardiomegaly. Normal mediastinal contours. Normal pulmonary vascularity. No focal consolidation, pleural effusion, or  pneumothorax. IMPRESSION: Negative. Electronically Signed   By: Titus Dubin M.D.   On: 08/18/2018 13:07   Ct Head Wo Contrast  Result Date: 08/18/2018 CLINICAL DATA:  Fall yesterday.  Recent UTI.  Tremors. EXAM: CT HEAD WITHOUT CONTRAST TECHNIQUE: Contiguous axial images were obtained from the base of the skull through the vertex without intravenous contrast. COMPARISON:  None. FINDINGS: Brain: Ventricles, cisterns and other CSF spaces are within normal. There is no mass, mass effect, shift of midline structures or acute hemorrhage. No evidence of acute infarction. Vascular: No hyperdense vessel or unexpected calcification. Skull: Normal. Negative for fracture or focal lesion. Sinuses/Orbits: Orbits are normal. Paranasal sinuses are well aerated with mild dependent opacification over the sphenoid sinus. Mastoid air cells are clear. Other: None. IMPRESSION: No acute findings. Minimal inflammatory change over the sphenoid sinus. Electronically Signed   By: Marin Olp M.D.   On: 08/18/2018 15:21   Ct Renal Stone  Study  Result Date: 08/18/2018 CLINICAL DATA:  Flank pain, recurrent stone disease EXAM: CT ABDOMEN AND PELVIS WITHOUT CONTRAST TECHNIQUE: Multidetector CT imaging of the abdomen and pelvis was performed following the standard protocol without IV contrast. COMPARISON:  08/12/2018 FINDINGS: Lower chest: No acute abnormality. Hepatobiliary: No focal liver abnormality is seen. Status post cholecystectomy. No biliary dilatation. Pancreas: Unremarkable. No pancreatic ductal dilatation or surrounding inflammatory changes. Spleen: Normal in size without focal abnormality. Adrenals/Urinary Tract: Normal adrenal glands. 8 mm right UPJ calculus. Nonobstructing left renal calculus measuring 6 mm. No obstructive uropathy. Normal bladder. Stomach/Bowel: Stomach is within normal limits. Appendix appears normal. No evidence of bowel wall thickening, distention, or inflammatory changes. Diverticulosis without evidence of diverticulitis. Vascular/Lymphatic: Normal caliber abdominal aorta with atherosclerosis. No lymphadenopathy. Reproductive: Status post hysterectomy. No adnexal masses. Other: No fluid collection or hematoma. Fat containing umbilical hernia. Musculoskeletal: No acute osseous abnormality. No aggressive osseous lesion. Degenerative disease with disc height loss at L5-S1 with bilateral facet arthropathy. Severe left foraminal stenosis. Mild osteoarthritis of bilateral hips. IMPRESSION: 1. Nonobstructing 8 mm right UPJ calculus. 2. Nonobstructing left renal calculus measuring 6 mm. 3.  Aortic Atherosclerosis (ICD10-I70.0). Electronically Signed   By: Kathreen Devoid   On: 08/18/2018 15:24    ____________________________________________   PROCEDURES  Procedure(s) performed:   Procedures  None  ____________________________________________   INITIAL IMPRESSION / ASSESSMENT AND PLAN / ED COURSE  Pertinent labs & imaging results that were available during my care of the patient were reviewed by me and  considered in my medical decision making (see chart for details).   Patient presents to the emergency department for evaluation of tremor.  She apparently had some altered mental status yesterday which has resolved.  Family describes this as drowsiness but patient reports feeling well today.  Afebrile.  Patient with an intention type tremor with movement.  No tremor at rest.  No weakness or numbness.  Plan for CT imaging of the head along with screening lab work.  CT imaging reviewed along with plain films with no acute injury or process.  Lab work unremarkable including urinalysis.  The patient had a tele-neurology evaluation for the tremor.  Very low suspicion for seizure or other movement disorder.  Plan made for outpatient neurology follow-up for continued evaluation.  No medication changes.  Discussed need for PCP and neurology follow-up as an outpatient.  Discussed ED return precautions. ____________________________________________  FINAL CLINICAL IMPRESSION(S) / ED DIAGNOSES  Final diagnoses:  Coarse tremors  Left flank pain     MEDICATIONS GIVEN DURING THIS  VISIT:  Medications  sodium chloride 0.9 % bolus 500 mL (0 mLs Intravenous Stopped 08/18/18 1248)  HYDROmorphone (DILAUDID) injection 1 mg (1 mg Intravenous Given 08/18/18 1218)  HYDROmorphone (DILAUDID) injection 1 mg (1 mg Intravenous Given 08/18/18 1412)  ondansetron (ZOFRAN-ODT) disintegrating tablet 4 mg (4 mg Oral Given 08/18/18 1605)     Note:  This document was prepared using Dragon voice recognition software and may include unintentional dictation errors.  Nanda Quinton, MD Emergency Medicine    Trisha Morandi, [facility], MD 08/19/18 [phone]

## 2018-08-18 NOTE — ED Triage Notes (Signed)
Patient reports trebling for months but began having "jerking" yesterday which is new. Patient reports falling yesterday. Patient has not been taking regular medications except for her pain meds. Family reports AMS yesterday. Patient alert and oriented x 4 today. CBG at home this am 122.

## 2018-08-18 NOTE — ED Triage Notes (Signed)
Patient states she was seen by her PCP on Monday and diagnosed with a UTI, prescribed Keflex.

## 2018-08-18 NOTE — Consult Note (Signed)
[facility] Tele[facility]urology Consult Services - stat consult  Impression:  Tremors - etiol unclear, may be related to left abdominal pain v metabolic cause, less likely seizures  Recommendations:  Agree w metabolic/abd pain workup/infectious Agree w CT head can consider seizure workup if no metabolic/infectious/pain related cause found  d/w ED MD  ---------------------------------------------------------------------  CC:  History of Present Illness:  68 yo F h/o HTN, GERD p/w tremors/single jerks of arms and legs for months, jerking since yesterday.  She c/o back pain and abdominal pain.  Patient had jerking in her leg yesterday all day and all night. Today she c/o trembling in arms, legs tremble when she lifts them.  She is unable to give detailed history.  Diagnostic Testing: CT head - pending  Vital Signs:    Exam:  Mental Status:  awake,alert, nad  Cranial Nerves:  Extraocular movements: Intact in all cardinal gaze Ptosis: Absent Facial movements: Intact and symmetric   Sensation intact  Motor Exam:  No drift   Tremor/Abnormal Movements:  Resting tremor: Absent Intention tremor: Absent Postural tremor: all 4 extremities fine tremor w posture, not seen at rest R>L  Sensory: intact light touch  Coordination: finger to nose intact bilat  Medical Decision Making:  - Extensive number of diagnosis or management options are considered above.   - Extensive amount of complex data reviewed.   - High risk of complication and/or morbidity or mortality are associated with differential diagnostic considerations above.  - There may be uncertain outcome and increased probability of prolonged functional impairment or high probability of severe prolonged functional impairment associated with some of these differential diagnosis.   Medical Data Reviewed:  1.Data reviewed include clinical labs, radiology,  Medical Tests;   2.Tests results discussed w/performing or interpreting  physician;   3.Obtaining/reviewing old medical records;  4.Obtaining case history from another source;  5.Independent review of image, tracing or specimen.    Patient was informed the Neurology Consult would happen via telehealth (remote video) and consented to receiving care in this manner.

## 2018-08-20 LAB — URINE CULTURE
Culture: NO GROWTH
Special Requests: NORMAL

## 2018-09-06 DIAGNOSIS — Z0000 Encounter for general adult medical examination without abnormal findings: Secondary | ICD-10-CM | POA: Diagnosis not present

## 2018-09-09 ENCOUNTER — Other Ambulatory Visit: Payer: Self-pay | Admitting: Cardiovascular Disease

## 2018-09-09 NOTE — Telephone Encounter (Signed)
Spoke with patient who confirms that she has been taking hydralizine 25 mg three times daily for more than one year. Patient also said that the request should have been sent to her family doctors office. Patient advised that pharmacy would be notified to send request to PCP.

## 2018-10-26 DIAGNOSIS — I10 Essential (primary) hypertension: Secondary | ICD-10-CM | POA: Diagnosis not present

## 2018-10-26 DIAGNOSIS — N184 Chronic kidney disease, stage 4 (severe): Secondary | ICD-10-CM | POA: Diagnosis not present

## 2018-10-26 DIAGNOSIS — D638 Anemia in other chronic diseases classified elsewhere: Secondary | ICD-10-CM | POA: Diagnosis not present

## 2018-10-26 DIAGNOSIS — E782 Mixed hyperlipidemia: Secondary | ICD-10-CM | POA: Diagnosis not present

## 2018-10-26 DIAGNOSIS — E039 Hypothyroidism, unspecified: Secondary | ICD-10-CM | POA: Diagnosis not present

## 2018-11-02 DIAGNOSIS — E039 Hypothyroidism, unspecified: Secondary | ICD-10-CM | POA: Diagnosis not present

## 2018-11-02 DIAGNOSIS — I129 Hypertensive chronic kidney disease with stage 1 through stage 4 chronic kidney disease, or unspecified chronic kidney disease: Secondary | ICD-10-CM | POA: Diagnosis not present

## 2018-11-02 DIAGNOSIS — N184 Chronic kidney disease, stage 4 (severe): Secondary | ICD-10-CM | POA: Diagnosis not present

## 2018-11-02 DIAGNOSIS — I5032 Chronic diastolic (congestive) heart failure: Secondary | ICD-10-CM | POA: Diagnosis not present

## 2018-11-02 DIAGNOSIS — D631 Anemia in chronic kidney disease: Secondary | ICD-10-CM | POA: Diagnosis not present

## 2018-11-03 ENCOUNTER — Other Ambulatory Visit: Payer: Self-pay | Admitting: Adult Health Nurse Practitioner

## 2018-11-03 DIAGNOSIS — R1084 Generalized abdominal pain: Secondary | ICD-10-CM

## 2018-11-03 DIAGNOSIS — Q446 Cystic disease of liver: Secondary | ICD-10-CM

## 2018-11-08 ENCOUNTER — Other Ambulatory Visit (HOSPITAL_COMMUNITY): Payer: Self-pay | Admitting: Adult Health Nurse Practitioner

## 2018-11-08 DIAGNOSIS — Q446 Cystic disease of liver: Secondary | ICD-10-CM

## 2018-11-08 DIAGNOSIS — R1084 Generalized abdominal pain: Secondary | ICD-10-CM

## 2018-11-17 ENCOUNTER — Other Ambulatory Visit: Payer: Self-pay

## 2018-11-17 ENCOUNTER — Ambulatory Visit (HOSPITAL_COMMUNITY)
Admission: RE | Admit: 2018-11-17 | Discharge: 2018-11-17 | Disposition: A | Discharge status: Home / Self Care | Payer: Medicare Other | Source: Ambulatory Visit | Attending: Adult Health Nurse Practitioner | Admitting: Adult Health Nurse Practitioner

## 2018-11-17 DIAGNOSIS — R1084 Generalized abdominal pain: Secondary | ICD-10-CM | POA: Diagnosis not present

## 2018-11-17 DIAGNOSIS — N19 Unspecified kidney failure: Secondary | ICD-10-CM | POA: Diagnosis not present

## 2018-11-17 DIAGNOSIS — K429 Umbilical hernia without obstruction or gangrene: Secondary | ICD-10-CM | POA: Diagnosis not present

## 2018-11-17 DIAGNOSIS — Q446 Cystic disease of liver: Secondary | ICD-10-CM

## 2018-11-17 DIAGNOSIS — K5730 Diverticulosis of large intestine without perforation or abscess without bleeding: Secondary | ICD-10-CM | POA: Diagnosis not present

## 2019-03-04 DIAGNOSIS — E039 Hypothyroidism, unspecified: Secondary | ICD-10-CM | POA: Diagnosis not present

## 2019-03-04 DIAGNOSIS — N184 Chronic kidney disease, stage 4 (severe): Secondary | ICD-10-CM | POA: Diagnosis not present

## 2019-03-04 DIAGNOSIS — E782 Mixed hyperlipidemia: Secondary | ICD-10-CM | POA: Diagnosis not present

## 2019-03-04 DIAGNOSIS — M1000 Idiopathic gout, unspecified site: Secondary | ICD-10-CM | POA: Diagnosis not present

## 2019-03-04 DIAGNOSIS — G629 Polyneuropathy, unspecified: Secondary | ICD-10-CM | POA: Diagnosis not present

## 2019-03-09 DIAGNOSIS — I129 Hypertensive chronic kidney disease with stage 1 through stage 4 chronic kidney disease, or unspecified chronic kidney disease: Secondary | ICD-10-CM | POA: Diagnosis not present

## 2019-03-09 DIAGNOSIS — E039 Hypothyroidism, unspecified: Secondary | ICD-10-CM | POA: Diagnosis not present

## 2019-03-09 DIAGNOSIS — D631 Anemia in chronic kidney disease: Secondary | ICD-10-CM | POA: Diagnosis not present

## 2019-03-09 DIAGNOSIS — N184 Chronic kidney disease, stage 4 (severe): Secondary | ICD-10-CM | POA: Diagnosis not present

## 2019-03-09 DIAGNOSIS — I5032 Chronic diastolic (congestive) heart failure: Secondary | ICD-10-CM | POA: Diagnosis not present

## 2019-04-20 IMAGING — CT CT HEAD WITHOUT CONTRAST
3 of 4 series · 16 of 47 positions shown, 19 images · non-contrast
Comparison: None.

CLINICAL DATA: Fall yesterday.  Recent UTI.  Tremors.

EXAM:
CT HEAD WITHOUT CONTRAST
TECHNIQUE: Contiguous axial images were obtained from the base of the skull
through the vertex without intravenous contrast.

[Series 2: head trauma wo · axial · 0.41mm/px · z∈[+27,+157]mm · 10 of 32 slices shown, 13 images]
[im 3/32  brain]
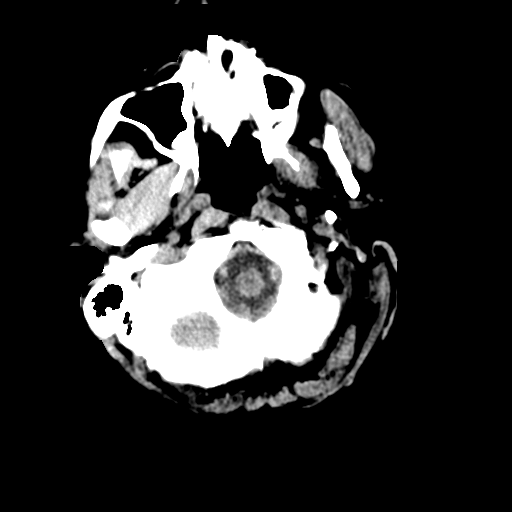
[im 3/32  bone]
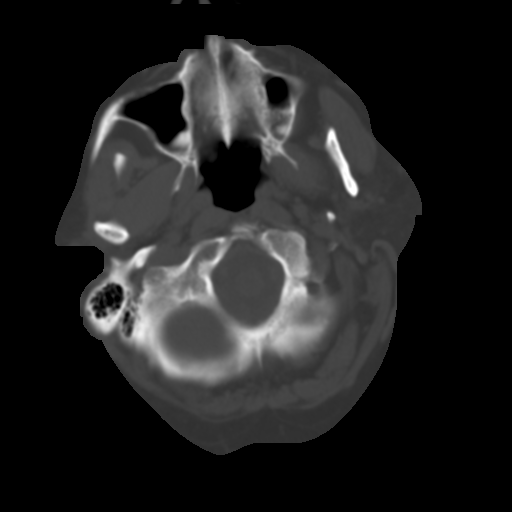
[im 5/32  brain]
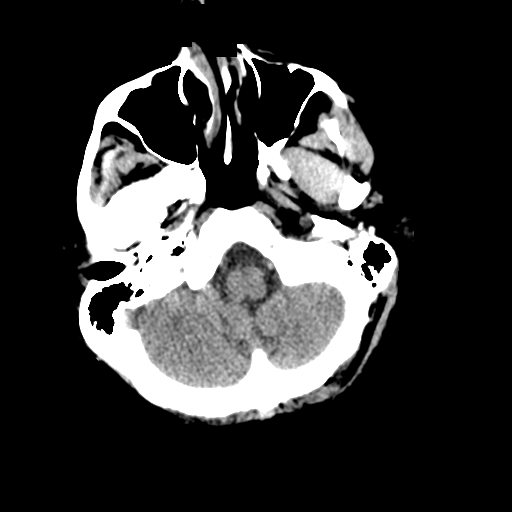
[im 9/32  brain]
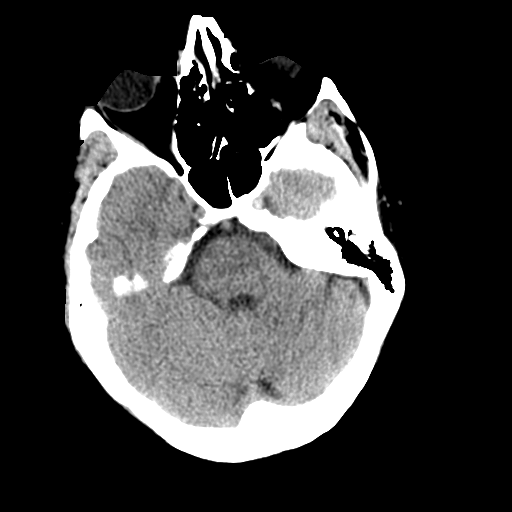
[im 12/32  brain]
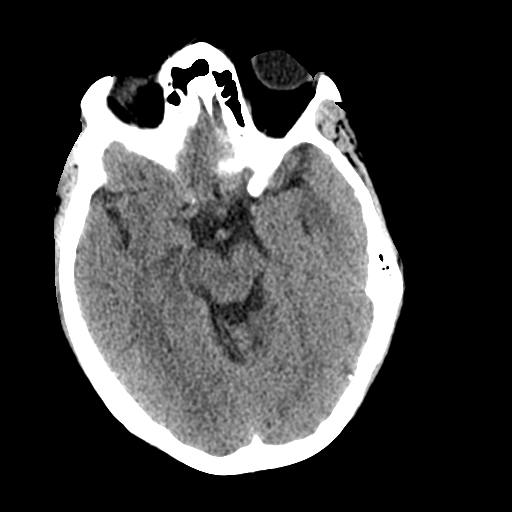
[im 14/32  brain]
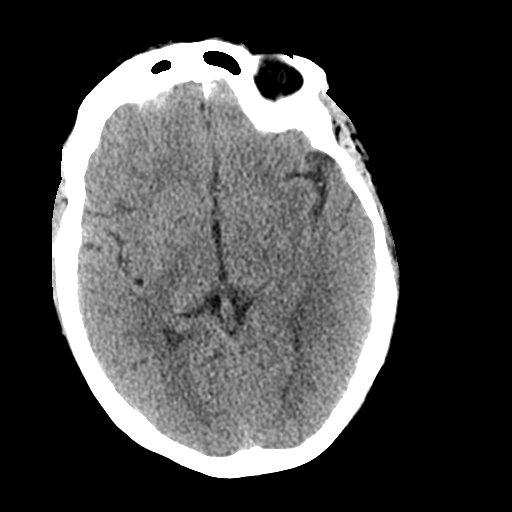
[im 14/32  bone]
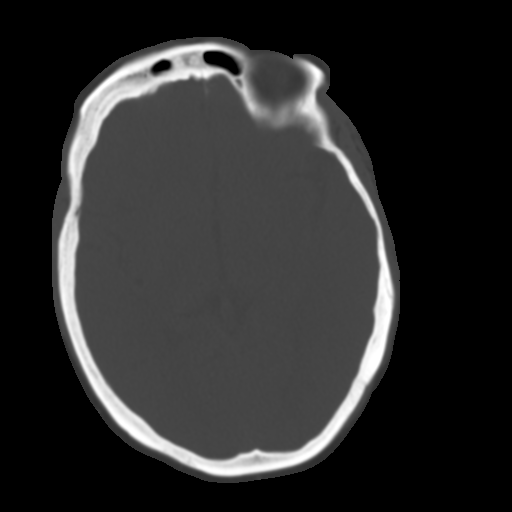
[im 18/32  brain]
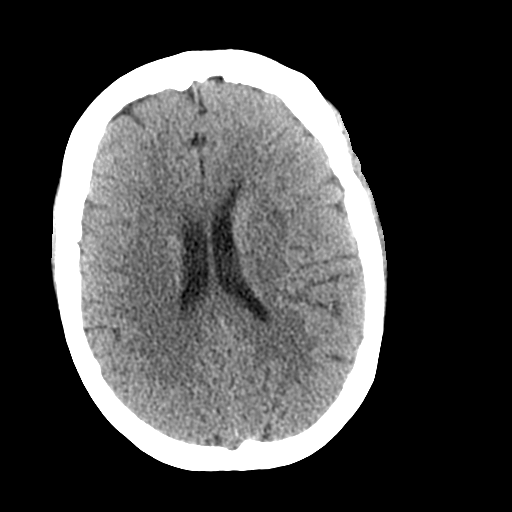
[im 20/32  brain]
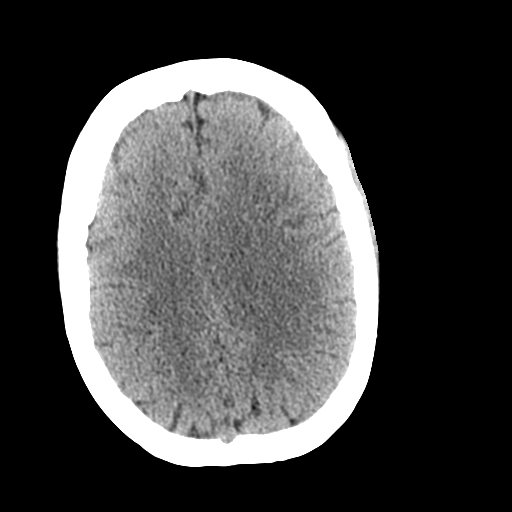
[im 23/32  brain]
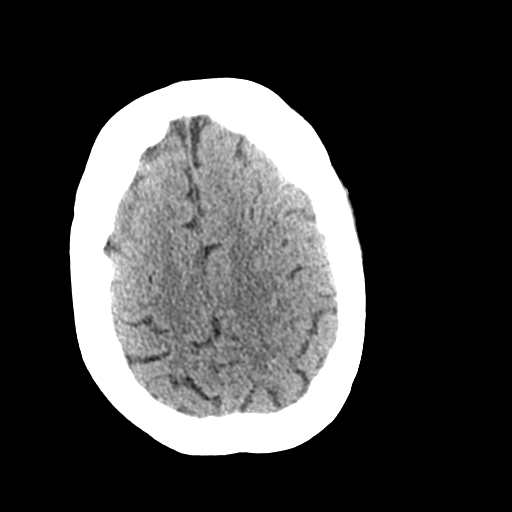
[im 27/32  brain]
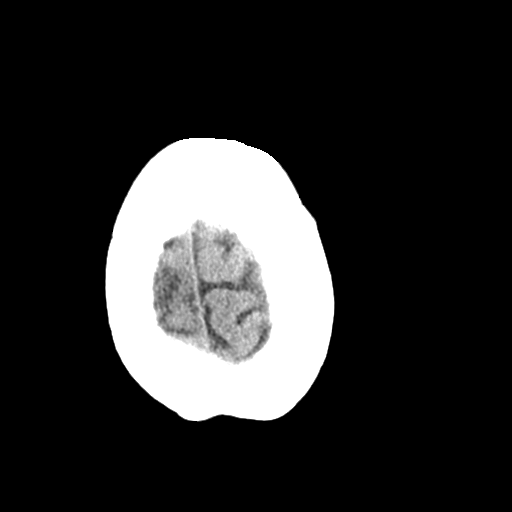
[im 27/32  bone]
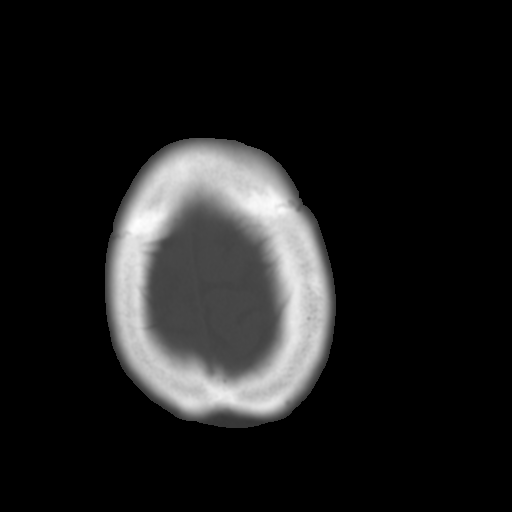
[im 29/32  brain]
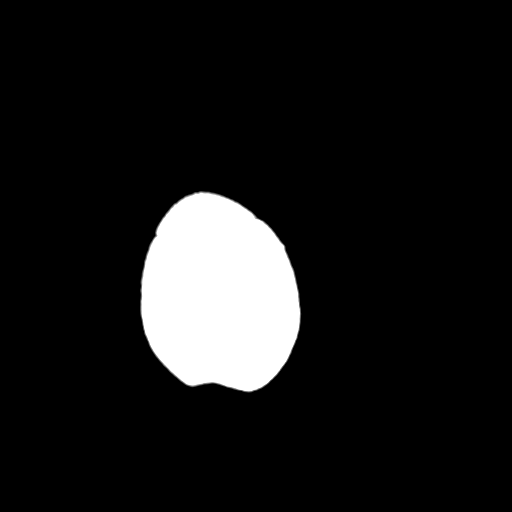

[Series 4: coronal soft tissue · coronal · 0.33mm/px · 3 of 80 slices shown]
[im 27/80  brain]
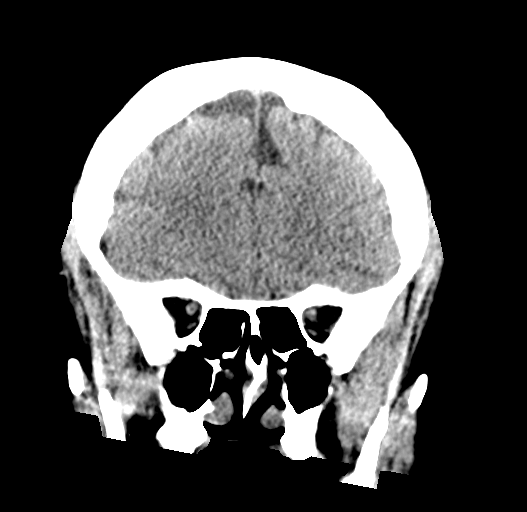
[im 36/80  brain]
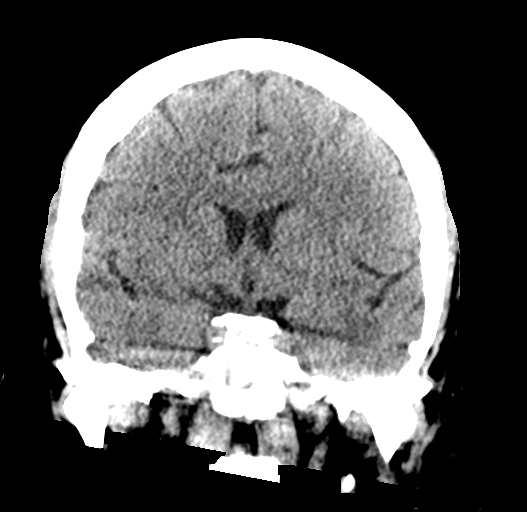
[im 44/80  brain]
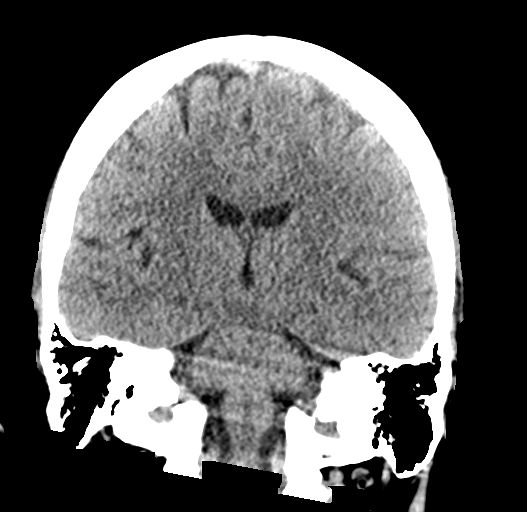

[Series 5: sagittal soft tissue · sagittal · 0.35mm/px · 3 of 56 slices shown]
[im 20/56  brain]
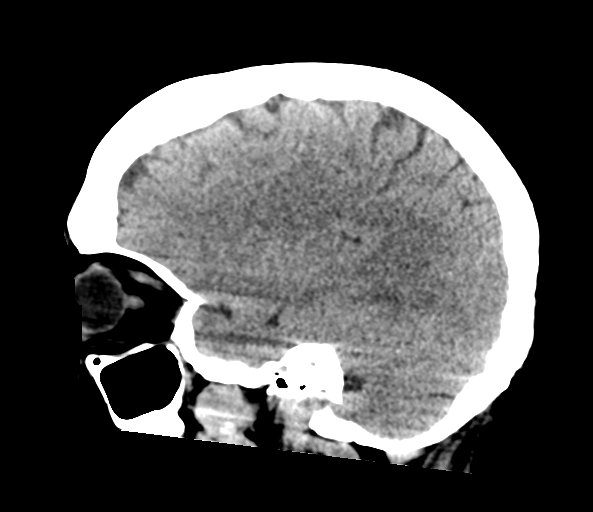
[im 28/56  brain]
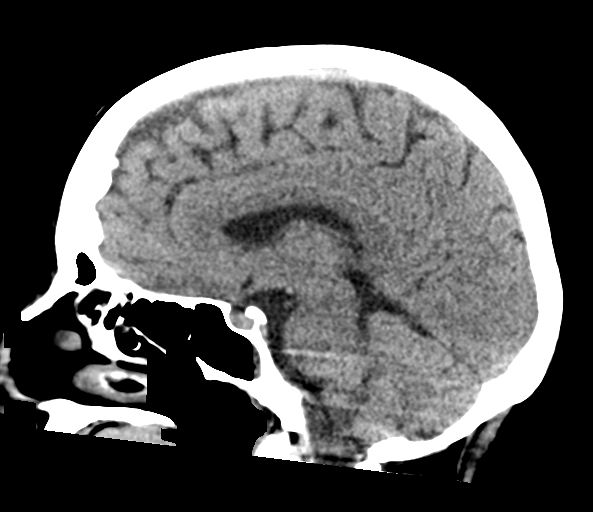
[im 36/56  brain]
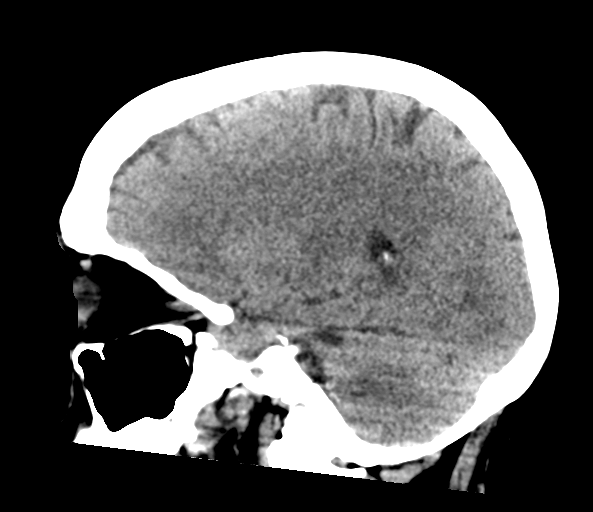

[16 of 47 positions shown; findings below may reference images not displayed]

FINDINGS: Brain: Ventricles, cisterns and other CSF spaces are within normal.
There is no mass, mass effect, shift of midline structures or acute
hemorrhage. No evidence of acute infarction.

Vascular: No hyperdense vessel or unexpected calcification.

Skull: Normal. Negative for fracture or focal lesion.

Sinuses/Orbits: Orbits are normal. Paranasal sinuses are well
aerated with mild dependent opacification over the sphenoid sinus.
Mastoid air cells are clear.

Other: None.
IMPRESSION: No acute findings.

Minimal inflammatory change over the sphenoid sinus.

## 2019-04-21 DIAGNOSIS — E039 Hypothyroidism, unspecified: Secondary | ICD-10-CM | POA: Diagnosis not present

## 2019-05-10 ENCOUNTER — Telehealth: Payer: Self-pay

## 2019-05-10 NOTE — Telephone Encounter (Signed)
Virtual Visit Pre-Appointment Phone Call  "(Name), I am calling you today to discuss your upcoming appointment. We are currently trying to limit exposure to the virus that causes COVID-19 by seeing patients at home rather than in the office."  "What is the BEST phone number to call the day of the visit?" - include this in appointment notes  "Do you have or have access to (through a family member/friend) a smartphone with video capability that we can use for your visit?" If yes - list this number in appt notes as "cell" (if different from BEST phone #) and list the appointment type as a VIDEO visit in appointment notes If no - list the appointment type as a PHONE visit in appointment notes  Confirm consent - "In the setting of the current Covid19 crisis, you are scheduled for a (phone or video) visit with your provider on (date) at (time).  Just as we do with many in-office visits, in order for you to participate in this visit, we must obtain consent.  If you'd like, I can send this to your mychart (if signed up) or email for you to review.  Otherwise, I can obtain your verbal consent now.  All virtual visits are billed to your insurance company just like a normal visit would be.  By agreeing to a virtual visit, we'd like you to understand that the technology does not allow for your provider to perform an examination, and thus may limit your provider's ability to fully assess your condition. If your provider identifies any concerns that need to be evaluated in person, we will make arrangements to do so.  Finally, though the technology is pretty good, we cannot assure that it will always work on either your or our end, and in the setting of a video visit, we may have to convert it to a phone-only visit.  In either situation, we cannot ensure that we have a secure connection.  Are you willing to proceed?" STAFF: Did the patient verbally acknowledge consent to telehealth visit? Document YES/NO here:    Advise patient to be prepared - "Two hours prior to your appointment, go ahead and check your blood pressure, pulse, oxygen saturation, and your weight (if you have the equipment to check those) and write them all down. When your visit starts, your provider will ask you for this information. If you have an Apple Watch or Kardia device, please plan to have heart rate information ready on the day of your appointment. Please have a pen and paper handy nearby the day of the visit as well."  Give patient instructions for MyChart download to smartphone OR [email or web address] as below if video visit (depending on what platform provider is using)  Inform patient they will receive a phone call 15 minutes prior to their appointment time (may be from unknown caller ID) so they should be prepared to answer    TELEPHONE CALL NOTE  Kathleen Vance has been deemed a candidate for a follow-up tele-health visit to limit community exposure during the Covid-19 pandemic. I spoke with the patient via phone to ensure availability of phone/video source, confirm preferred email & phone number, and discuss instructions and expectations.  I reminded Kathleen Vance to be prepared with any vital sign and/or heart rhythm information that could potentially be obtained via home monitoring, at the time of her visit. I reminded Kathleen Vance to expect a phone call prior to her visit.  Dorothey Baseman 05/10/2019 3:37 PM  INSTRUCTIONS FOR DOWNLOADING THE MYCHART APP TO SMARTPHONE  - The patient must first make sure to have activated MyChart and know their login information - If Apple, go to [employer] and type in MyChart in the search bar and download the app. If Android, ask patient to go to [employer] and type in [address] in the search bar and download the app. The app is free but as with any other app downloads, their phone may require them to verify saved payment information or Apple/Android password.  - The  patient will need to then log into the app with their MyChart username and password, and select [facility] as their healthcare provider to link the account. When it is time for your visit, go to the MyChart app, find appointments, and click Begin Video Visit. Be sure to Select Allow for your device to access the Microphone and Camera for your visit. You will then be connected, and your provider will be with you shortly.  **If they have any issues connecting, or need assistance please contact MyChart service desk [phone])**  **If using a computer, in order to ensure the best quality for their visit they will need to use either of the following Internet Browsers: [employer], or Google Chrome**  IF USING DOXIMITY or DOXY.ME - The patient will receive a link just prior to their visit by text.     FULL LENGTH CONSENT FOR TELE-HEALTH VISIT   I hereby voluntarily request, consent and authorize [facility] and its employed or contracted physicians, physician assistants, nurse practitioners or other licensed health care professionals (the Practitioner), to provide me with telemedicine health care services (the "Services") as deemed necessary by the treating Practitioner. I acknowledge and consent to receive the Services by the Practitioner via telemedicine. I understand that the telemedicine visit will involve communicating with the Practitioner through live audiovisual communication technology and the disclosure of certain medical information by electronic transmission. I acknowledge that I have been given the opportunity to request an in-person assessment or other available alternative prior to the telemedicine visit and am voluntarily participating in the telemedicine visit.  I understand that I have the right to withhold or withdraw my consent to the use of telemedicine in the course of my care at any time, without affecting my right to future care or treatment, and that the  Practitioner or I may terminate the telemedicine visit at any time. I understand that I have the right to inspect all information obtained and/or recorded in the course of the telemedicine visit and may receive copies of available information for a reasonable fee.  I understand that some of the potential risks of receiving the Services via telemedicine include:  Delay or interruption in medical evaluation due to technological equipment failure or disruption; Information transmitted may not be sufficient (e.g. poor resolution of images) to allow for appropriate medical decision making by the Practitioner; and/or  In rare instances, security protocols could fail, causing a breach of personal health information.  Furthermore, I acknowledge that it is my responsibility to provide information about my medical history, conditions and care that is complete and accurate to the best of my ability. I acknowledge that Practitioner's advice, recommendations, and/or decision may be based on factors not within their control, such as incomplete or inaccurate data provided by me or distortions of diagnostic images or specimens that may result from electronic transmissions. I understand that the practice of medicine is not an exact science and that Practitioner  makes no warranties or guarantees regarding treatment outcomes. I acknowledge that I will receive a copy of this consent concurrently upon execution via email to the email address I last provided but may also request a printed copy by calling the office of [facility].    I understand that my insurance will be billed for this visit.   I have read or had this consent read to me. I understand the contents of this consent, which adequately explains the benefits and risks of the Services being provided via telemedicine.  I have been provided ample opportunity to ask questions regarding this consent and the Services and have had my questions answered to my  satisfaction. I give my informed consent for the services to be provided through the use of telemedicine in my medical care  By participating in this telemedicine visit I agree to the above.

## 2019-05-13 ENCOUNTER — Encounter: Payer: Self-pay | Admitting: Cardiovascular Disease

## 2019-05-13 ENCOUNTER — Encounter: Payer: Medicare Other | Admitting: Cardiovascular Disease

## 2019-05-13 NOTE — Progress Notes (Signed)
Erroneous encounter

## 2019-07-15 DIAGNOSIS — N184 Chronic kidney disease, stage 4 (severe): Secondary | ICD-10-CM | POA: Diagnosis not present

## 2019-07-15 DIAGNOSIS — E782 Mixed hyperlipidemia: Secondary | ICD-10-CM | POA: Diagnosis not present

## 2019-07-15 DIAGNOSIS — E039 Hypothyroidism, unspecified: Secondary | ICD-10-CM | POA: Diagnosis not present

## 2019-07-15 DIAGNOSIS — M1000 Idiopathic gout, unspecified site: Secondary | ICD-10-CM | POA: Diagnosis not present

## 2019-07-15 DIAGNOSIS — G629 Polyneuropathy, unspecified: Secondary | ICD-10-CM | POA: Diagnosis not present

## 2019-07-20 DIAGNOSIS — N184 Chronic kidney disease, stage 4 (severe): Secondary | ICD-10-CM | POA: Diagnosis not present

## 2019-07-20 DIAGNOSIS — E039 Hypothyroidism, unspecified: Secondary | ICD-10-CM | POA: Diagnosis not present

## 2019-07-20 DIAGNOSIS — E782 Mixed hyperlipidemia: Secondary | ICD-10-CM | POA: Diagnosis not present

## 2019-07-20 DIAGNOSIS — I5032 Chronic diastolic (congestive) heart failure: Secondary | ICD-10-CM | POA: Diagnosis not present

## 2019-07-20 DIAGNOSIS — D631 Anemia in chronic kidney disease: Secondary | ICD-10-CM | POA: Diagnosis not present

## 2019-07-20 DIAGNOSIS — I129 Hypertensive chronic kidney disease with stage 1 through stage 4 chronic kidney disease, or unspecified chronic kidney disease: Secondary | ICD-10-CM | POA: Diagnosis not present

## 2019-07-20 DIAGNOSIS — Z0001 Encounter for general adult medical examination with abnormal findings: Secondary | ICD-10-CM | POA: Diagnosis not present

## 2019-07-20 DIAGNOSIS — G629 Polyneuropathy, unspecified: Secondary | ICD-10-CM | POA: Diagnosis not present

## 2019-07-20 DIAGNOSIS — M1000 Idiopathic gout, unspecified site: Secondary | ICD-10-CM | POA: Diagnosis not present

## 2019-08-03 DIAGNOSIS — M5137 Other intervertebral disc degeneration, lumbosacral region: Secondary | ICD-10-CM | POA: Diagnosis not present

## 2019-08-03 DIAGNOSIS — I5032 Chronic diastolic (congestive) heart failure: Secondary | ICD-10-CM | POA: Diagnosis not present

## 2019-08-03 DIAGNOSIS — D631 Anemia in chronic kidney disease: Secondary | ICD-10-CM | POA: Diagnosis not present

## 2019-08-03 DIAGNOSIS — I129 Hypertensive chronic kidney disease with stage 1 through stage 4 chronic kidney disease, or unspecified chronic kidney disease: Secondary | ICD-10-CM | POA: Diagnosis not present

## 2019-08-03 DIAGNOSIS — R0602 Shortness of breath: Secondary | ICD-10-CM | POA: Diagnosis not present

## 2019-08-12 ENCOUNTER — Ambulatory Visit: Payer: Medicare Other | Admitting: Cardiovascular Disease

## 2019-08-12 ENCOUNTER — Telehealth (INDEPENDENT_AMBULATORY_CARE_PROVIDER_SITE_OTHER): Payer: Medicare Other | Admitting: Cardiovascular Disease

## 2019-08-12 ENCOUNTER — Telehealth: Payer: Self-pay | Admitting: Cardiovascular Disease

## 2019-08-12 ENCOUNTER — Other Ambulatory Visit: Payer: Self-pay

## 2019-08-12 ENCOUNTER — Encounter: Payer: Self-pay | Admitting: Cardiovascular Disease

## 2019-08-12 ENCOUNTER — Encounter: Payer: Self-pay | Admitting: *Deleted

## 2019-08-12 VITALS — BP 142/102 | HR 64 | Ht 64.0 in | Wt 195.0 lb

## 2019-08-12 DIAGNOSIS — R7989 Other specified abnormal findings of blood chemistry: Secondary | ICD-10-CM

## 2019-08-12 DIAGNOSIS — N184 Chronic kidney disease, stage 4 (severe): Secondary | ICD-10-CM | POA: Diagnosis not present

## 2019-08-12 DIAGNOSIS — R0602 Shortness of breath: Secondary | ICD-10-CM

## 2019-08-12 DIAGNOSIS — I10 Essential (primary) hypertension: Secondary | ICD-10-CM | POA: Diagnosis not present

## 2019-08-12 DIAGNOSIS — I517 Cardiomegaly: Secondary | ICD-10-CM

## 2019-08-12 DIAGNOSIS — M7989 Other specified soft tissue disorders: Secondary | ICD-10-CM

## 2019-08-12 MED ORDER — HYDRALAZINE HCL 50 MG PO TABS: 50.0000 mg | ORAL_TABLET | Freq: Three times a day (TID) | ORAL | 3 refills | Status: DC

## 2019-08-12 NOTE — Patient Instructions (Addendum)
Medication Instructions:   Increase Hydralazine to 50mg  three times per day.  Continue all other medications.    Labwork:   Testing/Procedures:  Your physician has requested that you have an echocardiogram. Echocardiography is a painless test that uses sound waves to create images of your heart. It provides your doctor with information about the size and shape of your heart and how well your heart's chambers and valves are working. This procedure takes approximately one hour. There are no restrictions for this procedure.  Your physician has requested that you have a lexiscan myoview. For further information please visit [email or web address]. Please follow instruction sheet, as given.  Office will contact with results via phone or letter.    Follow-Up: 3 months   Any Other Special Instructions Will Be Listed Below (If Applicable).  If you need a refill on your cardiac medications before your next appointment, please call your pharmacy.

## 2019-08-12 NOTE — Telephone Encounter (Signed)
Pre-cert Verification for the following procedure     LEXISCAN & ECHO   DATE:    08/26/2019  LOCATION:  [facility]

## 2019-08-12 NOTE — Progress Notes (Signed)
SUBJECTIVE: The patient presents for the evaluation of shortness of breath.  I last saw her in March 2019.  I reviewed notes from her PCP.  I also reviewed labs dated 07/15/2019: White blood cell 6.9, hemoglobin 10.7, platelets 174, BUN 20, creatinine 1.7, total cholesterol 137, triglycerides 164, HDL 29, LDL 79, hemoglobin A1c 5.4%, TSH 6.98.  She was seen by her PCP on 07/20/2019.  At that visit she complained of difficulty breathing over the previous month and worsening over the previous week.  Her PCP checked a BNP and has placed her on Lasix 20 mg 3 times daily.  The patient is here with her husband.  She tells me that she has had increasing exertional dyspnea for the past year.  She also had some weakening of abdominal muscle and has diffuse abdominal discomfort and has had to sleep in a chair.  She also has a hernia.  She has early satiety and has been losing weight.  She denies exertional chest pain.  She has had bilateral feet swelling, right greater than left.  Ever since being on Lasix her shortness of breath has significantly improved as has her feet swelling.  She has had difficulty taking potassium chloride both in tablet and liquid form.  She has had increased cramping.  BNP was elevated at 551 on 07/20/2019.  The lowest her blood pressures get at home is in the 130/90 range.  She also follows with nephrology.  Impression reviewed ECG performed today which demonstrates sinus rhythm with LVH and repolarization abnormalities.     Review of Systems: As per "subjective", otherwise negative.  Allergies  Allergen Reactions  . Bactrim [Sulfamethoxazole-Trimethoprim]     Nausea, diarrhea  . Ciprofloxacin Anaphylaxis  . Nitrofurantoin Anaphylaxis  . Metronidazole     "sick"  . Amoxicillin Rash  . Erythromycin Rash    Current Outpatient Medications  Medication Sig Dispense Refill  . allopurinol (ZYLOPRIM) 100 MG tablet Take 100 mg by mouth every morning.    .  furosemide (LASIX) 20 MG tablet Take 20 mg by mouth in the morning, at noon, and at bedtime.    . hydrALAZINE (APRESOLINE) 25 MG tablet Take 25 mg by mouth 3 (three) times daily.    Marland Kitchen HYDROcodone-acetaminophen (NORCO) 7.5-325 MG tablet Take 1 tablet by mouth 3 (three) times daily as needed for moderate pain.    Marland Kitchen labetalol (NORMODYNE) 200 MG tablet Take 1 tablet (200 mg total) by mouth 2 (two) times daily. 60 tablet 0  . levothyroxine (SYNTHROID) 150 MCG tablet Take 150 mcg by mouth daily before breakfast.    . losartan (COZAAR) 100 MG tablet Take 100 mg by mouth daily.    . ondansetron (ZOFRAN ODT) 4 MG disintegrating tablet Take 1 tablet (4 mg total) by mouth every 8 (eight) hours as needed. 10 tablet 1  . SSD 1 % cream Apply 1 application topically 2 (two) times daily as needed.     No current facility-administered medications for this visit.    Past Medical History:  Diagnosis Date  . Aortic stenosis   . Arthritis   . Celiac artery stenosis ([place])   . Gout   . History of kidney stones   . Hyperlipidemia   . Hypertension   . Hypothyroidism   . Kidney failure   . Prediabetes   . Renal artery stenosis ([place])   . Shingles rash     Past Surgical History:  Procedure Laterality Date  . ANKLE SURGERY Right   .  CATARACT EXTRACTION Bilateral   . CESAREAN SECTION    . CHOLECYSTECTOMY    . DILATION AND CURETTAGE OF UTERUS    . PARTIAL HYSTERECTOMY    . REPAIR KNEE LIGAMENT Right   . TUBAL LIGATION      Social History   Socioeconomic History  . Marital status: Married    Spouse name: Not on file  . Number of children: Not on file  . Years of education: Not on file  . Highest education level: Not on file  Occupational History  . Not on file  Tobacco Use  . Smoking status: Never Smoker  . Smokeless tobacco: Never Used  Substance and Sexual Activity  . Alcohol use: Yes    Comment: rarely  . Drug use: Never  . Sexual activity: Not on file  Other Topics Concern  . Not on  file  Social History Narrative  . Not on file   Social Determinants of Health   Financial Resource Strain:   . Difficulty of Paying Living Expenses:   Food Insecurity:   . Worried About Charity fundraiser in the Last Year:   . Arboriculturist in the Last Year:   Transportation Needs:   . Film/video editor (Medical):   Marland Kitchen Lack of Transportation (Non-Medical):   Physical Activity:   . Days of Exercise per Week:   . Minutes of Exercise per Session:   Stress:   . Feeling of Stress :   Social Connections:   . Frequency of Communication with Friends and Family:   . Frequency of Social Gatherings with Friends and Family:   . Attends Religious Services:   . Active Member of Clubs or Organizations:   . Attends Archivist Meetings:   Marland Kitchen Marital Status:   Intimate Partner Violence:   . Fear of Current or Ex-Partner:   . Emotionally Abused:   Marland Kitchen Physically Abused:   . Sexually Abused:     Orson Slick, LPN was present throughout the entirety of the encounter.  Vitals:   08/12/19 1337  BP: (!) 142/102  Pulse: 64  SpO2: 95%  Weight: 195 lb (88.5 kg)  Height: 5\' 4"  (1.626 m)    Wt Readings from Last 3 Encounters:  08/12/19 195 lb (88.5 kg)  05/13/19 204 lb (92.5 kg)  08/18/18 189 lb 9.5 oz (86 kg)     PHYSICAL EXAM General: NAD HEENT: Normal. Neck: No JVD, no thyromegaly. Lungs: Clear to auscultation bilaterally with normal respiratory effort. CV: Regular rate and rhythm, normal S1/S2, no S3/S4, no murmur.  Trace bilateral periankle edema.  No carotid bruit.   Abdomen: Soft, nontender, no distention.  Neurologic: Alert and oriented.  Psych: Normal affect. Skin: Normal. Musculoskeletal: No gross deformities.      Labs: Lab Results  Component Value Date/Time   K 3.5 08/18/2018 12:08 PM   BUN 18 08/18/2018 12:08 PM   CREATININE 1.55 (H) 08/18/2018 12:08 PM   ALT 12 08/18/2018 12:08 PM   TSH 1.442 08/18/2018 12:08 PM   HGB 12.3 08/18/2018 12:08 PM      Lipids: No results found for: LDLCALC, LDLDIRECT, CHOL, TRIG, HDL    Echocardiogram 07/22/2017:  - Left ventricle: The cavity size was normal. Wall thickness was  increased in a pattern of mild LVH. Systolic function was normal.  The estimated ejection fraction was in the range of 60% to 65%.  Wall motion was normal; there were no regional wall motion  abnormalities. Left ventricular diastolic  function parameters  were normal.  - Left atrium: The atrium was mildly dilated.  - Atrial septum: No defect or patent foramen ovale was identified.  - Pericardium, extracardiac: Small mostly posterior pericardial  effusion.    Renal artery Dopplers 06/03/2016:  - Technically difficult study due to overlying bowel gas and body  habitus.  - Bilateral abnormal intrarenal resistive indices.  - No evidence of right renal artery stenosis.  - 1-59% left renal artery stenosis.  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     ASSESSMENT AND PLAN:  1.  Shortness of breath and bilateral feet swelling: BNP elevated at 551 on 07/20/2019, suggestive of diastolic heart failure.  Diastolic parameters were normal by echocardiogram in March 2019.  There is LVH on her ECG.  I will obtain a follow-up echocardiogram to assess for interval changes in cardiac structure and function.  She is currently taking Lasix 20 mg 3 times daily as prescribed by her PCP and symptoms have considerably improved.  She also has chronic kidney disease stage IV.  2.  Hypertension: Blood pressure is elevated.  The lowest BP gets at home is in the 130/90 range.  I will increase hydralazine to 50 mg 3 times daily.  She is already on losartan and labetalol.  3.  Chronic kidney disease stage IV: Currently on Lasix 20 mg 3 times daily as prescribed by her PCP.  Creatinine 1.7 on 07/15/2019.  She follows with nephrology.   Disposition: Follow up 3 months  Time spent: 40 minutes, of which greater than 50% was spent reviewing  symptoms, relevant blood tests and studies, and discussing management plan with the patient.    Kate Sable, M.D., F.A.C.C.

## 2019-08-12 NOTE — Telephone Encounter (Signed)
  Patient Consent for Virtual Visit         Kathleen Vance has provided verbal consent on 08/12/2019 for a virtual visit (video or telephone).   CONSENT FOR VIRTUAL VISIT FOR:  Kathleen Vance  By participating in this virtual visit I agree to the following:  I hereby voluntarily request, consent and authorize [facility] and its employed or contracted physicians, physician assistants, nurse practitioners or other licensed health care professionals (the Practitioner), to provide me with telemedicine health care services (the "Services") as deemed necessary by the treating Practitioner. I acknowledge and consent to receive the Services by the Practitioner via telemedicine. I understand that the telemedicine visit will involve communicating with the Practitioner through live audiovisual communication technology and the disclosure of certain medical information by electronic transmission. I acknowledge that I have been given the opportunity to request an in-person assessment or other available alternative prior to the telemedicine visit and am voluntarily participating in the telemedicine visit.  I understand that I have the right to withhold or withdraw my consent to the use of telemedicine in the course of my care at any time, without affecting my right to future care or treatment, and that the Practitioner or I may terminate the telemedicine visit at any time. I understand that I have the right to inspect all information obtained and/or recorded in the course of the telemedicine visit and may receive copies of available information for a reasonable fee.  I understand that some of the potential risks of receiving the Services via telemedicine include:  Marland Kitchen Delay or interruption in medical evaluation due to technological equipment failure or disruption; . Information transmitted may not be sufficient (e.g. poor resolution of images) to allow for appropriate medical decision making by the  Practitioner; and/or  . In rare instances, security protocols could fail, causing a breach of personal health information.  Furthermore, I acknowledge that it is my responsibility to provide information about my medical history, conditions and care that is complete and accurate to the best of my ability. I acknowledge that Practitioner's advice, recommendations, and/or decision may be based on factors not within their control, such as incomplete or inaccurate data provided by me or distortions of diagnostic images or specimens that may result from electronic transmissions. I understand that the practice of medicine is not an exact science and that Practitioner makes no warranties or guarantees regarding treatment outcomes. I acknowledge that a copy of this consent can be made available to me via my patient portal ([facility] [facility]), or I can request a printed copy by calling the office of [facility].    I understand that my insurance will be billed for this visit.   I have read or had this consent read to me. . I understand the contents of this consent, which adequately explains the benefits and risks of the Services being provided via telemedicine.  . I have been provided ample opportunity to ask questions regarding this consent and the Services and have had my questions answered to my satisfaction. . I give my informed consent for the services to be provided through the use of telemedicine in my medical care

## 2019-08-15 NOTE — Addendum Note (Signed)
Addended by: Merlene Laughter on: 08/15/2019 12:54 PM   Modules accepted: Orders

## 2019-08-26 ENCOUNTER — Encounter (HOSPITAL_COMMUNITY)
Admission: RE | Admit: 2019-08-26 | Discharge: 2019-08-26 | Disposition: A | Discharge status: Home / Self Care | Payer: Medicare Other | Source: Ambulatory Visit | Attending: Cardiovascular Disease | Admitting: Cardiovascular Disease

## 2019-08-26 ENCOUNTER — Encounter (HOSPITAL_BASED_OUTPATIENT_CLINIC_OR_DEPARTMENT_OTHER)
Admission: RE | Admit: 2019-08-26 | Discharge: 2019-08-26 | Disposition: A | Discharge status: Home / Self Care | Payer: Medicare Other | Source: Ambulatory Visit | Attending: Cardiovascular Disease | Admitting: Cardiovascular Disease

## 2019-08-26 ENCOUNTER — Ambulatory Visit (HOSPITAL_COMMUNITY)
Admission: RE | Admit: 2019-08-26 | Discharge: 2019-08-26 | Disposition: A | Discharge status: Home / Self Care | Payer: Medicare Other | Source: Ambulatory Visit | Attending: Cardiovascular Disease | Admitting: Cardiovascular Disease

## 2019-08-26 ENCOUNTER — Other Ambulatory Visit: Payer: Self-pay

## 2019-08-26 DIAGNOSIS — R0602 Shortness of breath: Secondary | ICD-10-CM | POA: Diagnosis not present

## 2019-08-26 LAB — NM MYOCAR MULTI W/SPECT W/WALL MOTION / EF
LV dias vol: 141 mL (ref 46–106)
LV sys vol: 78 mL
Peak HR: 65 {beats}/min
RATE: 0.38
Rest HR: 55 {beats}/min
SDS: 4
SRS: 6
SSS: 10
TID: 1.02

## 2019-08-26 MED ORDER — TECHNETIUM TC 99M SESTAMIBI - CARDIOLITE: 30.0000 | Freq: Once | INTRAVENOUS | Status: DC | PRN

## 2019-08-26 MED ORDER — SODIUM CHLORIDE FLUSH 0.9 % IV SOLN
INTRAVENOUS | Status: AC
  Administered 2019-08-26: 10 mL via INTRAVENOUS
  Filled 2019-08-26: qty 10

## 2019-08-26 MED ORDER — TECHNETIUM TC 99M TETROFOSMIN IV KIT
10.0000 | PACK | Freq: Once | INTRAVENOUS | Status: AC | PRN
  Administered 2019-08-26: 11 via INTRAVENOUS

## 2019-08-26 MED ORDER — REGADENOSON 0.4 MG/5ML IV SOLN
INTRAVENOUS | Status: AC
  Administered 2019-08-26: 0.4 mg via INTRAVENOUS
  Filled 2019-08-26: qty 5

## 2019-08-26 MED ORDER — TECHNETIUM TC 99M TETROFOSMIN IV KIT
30.0000 | PACK | Freq: Once | INTRAVENOUS | Status: AC | PRN
  Administered 2019-08-26: 30 via INTRAVENOUS

## 2019-08-26 NOTE — Progress Notes (Signed)
*  PRELIMINARY RESULTS* Echocardiogram 2D Echocardiogram has been performed.  Kathleen Vance 08/26/2019, 12:47 PM

## 2019-09-01 ENCOUNTER — Telehealth: Payer: Self-pay | Admitting: Cardiovascular Disease

## 2019-09-01 DIAGNOSIS — N1832 Chronic kidney disease, stage 3b: Secondary | ICD-10-CM | POA: Diagnosis not present

## 2019-09-01 DIAGNOSIS — R809 Proteinuria, unspecified: Secondary | ICD-10-CM | POA: Diagnosis not present

## 2019-09-01 DIAGNOSIS — I129 Hypertensive chronic kidney disease with stage 1 through stage 4 chronic kidney disease, or unspecified chronic kidney disease: Secondary | ICD-10-CM | POA: Diagnosis not present

## 2019-09-01 DIAGNOSIS — D631 Anemia in chronic kidney disease: Secondary | ICD-10-CM | POA: Diagnosis not present

## 2019-09-01 DIAGNOSIS — E559 Vitamin D deficiency, unspecified: Secondary | ICD-10-CM | POA: Diagnosis not present

## 2019-09-01 NOTE — Telephone Encounter (Signed)
Left message to return call 

## 2019-09-01 NOTE — Telephone Encounter (Addendum)
STRESS TEST -   Herminio Commons, MD  08/27/2019 11:31 AM EDT    Arrange for earlier follow up (virtual) so I can discuss next steps. There is evidence for blockage.    ECHO -   Herminio Commons, MD  08/27/2019 11:30 AM EDT    Cardiac function is mildly weak. Enlargement of upper chambers. Walls are thick due to HTN. Stiffness with heart relaxation, also causing leg swelling and shortness of breath.

## 2019-09-01 NOTE — Telephone Encounter (Signed)
Please call [phone]

## 2019-09-01 NOTE — Telephone Encounter (Signed)
Patient called requesting test results.

## 2019-09-08 ENCOUNTER — Telehealth: Payer: Self-pay | Admitting: *Deleted

## 2019-09-08 NOTE — Telephone Encounter (Signed)
Kathleen Vance, [place]  3/0/8569 4:37 PM EDT    Patient notified. Copy to pcp.   Kathleen Vance, [place]  0/09/2589 0:28 PM EDT    Left message to return call.

## 2019-09-08 NOTE — Telephone Encounter (Addendum)
STRESS TEST -   Laurine Blazer, LPN  09/10/4705 6:15 PM EDT    Patient notified. Copy to pcp. VV scheduled for Tuesday, 09/13/2019 at 2:00 - [place] schedule with Dr. Bronson Ing.    Laurine Blazer, [place]  1/83/4373 5:78 PM EDT    Left message to return call.   Herminio Commons, MD  08/27/2019 11:31 AM EDT    Arrange for earlier follow up (virtual) so I can discuss next steps. There is evidence for blockage.

## 2019-09-08 NOTE — Telephone Encounter (Signed)
-----   Message from Bernita Raisin, RN sent at 08/29/2019  7:39 AM EDT -----  ----- Message ----- From: Herminio Commons, MD Sent: 08/27/2019  11:30 AM EDT To: Bernita Raisin, RN  Cardiac function is mildly weak. Enlargement of upper chambers. Walls are thick due to HTN. Stiffness with heart relaxation, also causing leg swelling and shortness of breath.

## 2019-09-09 NOTE — Telephone Encounter (Signed)
Kathleen Vance, [place]  07/05/5329 7:40 PM EDT    Patient notified. Copy to pcp. VV scheduled for Tuesday, 09/13/2019 at 2:00 - [place] schedule with Dr. Bronson Ing

## 2019-09-13 ENCOUNTER — Encounter: Payer: Self-pay | Admitting: Cardiovascular Disease

## 2019-09-13 ENCOUNTER — Telehealth (INDEPENDENT_AMBULATORY_CARE_PROVIDER_SITE_OTHER): Payer: Medicare Other | Admitting: Cardiovascular Disease

## 2019-09-13 VITALS — BP 159/93 | HR 66 | Ht 64.0 in | Wt 194.0 lb

## 2019-09-13 DIAGNOSIS — I10 Essential (primary) hypertension: Secondary | ICD-10-CM | POA: Diagnosis not present

## 2019-09-13 DIAGNOSIS — N184 Chronic kidney disease, stage 4 (severe): Secondary | ICD-10-CM | POA: Diagnosis not present

## 2019-09-13 DIAGNOSIS — R9439 Abnormal result of other cardiovascular function study: Secondary | ICD-10-CM

## 2019-09-13 DIAGNOSIS — R0602 Shortness of breath: Secondary | ICD-10-CM

## 2019-09-13 DIAGNOSIS — I25118 Atherosclerotic heart disease of native coronary artery with other forms of angina pectoris: Secondary | ICD-10-CM

## 2019-09-13 DIAGNOSIS — M7989 Other specified soft tissue disorders: Secondary | ICD-10-CM

## 2019-09-13 DIAGNOSIS — I5042 Chronic combined systolic (congestive) and diastolic (congestive) heart failure: Secondary | ICD-10-CM | POA: Diagnosis not present

## 2019-09-13 MED ORDER — ASPIRIN EC 81 MG PO TBEC
81.0000 mg | DELAYED_RELEASE_TABLET | Freq: Every day | ORAL | 3 refills | Status: DC
Start: 2019-09-13 — End: 2022-08-20

## 2019-09-13 MED ORDER — ROSUVASTATIN CALCIUM 5 MG PO TABS
5.0000 mg | ORAL_TABLET | Freq: Every day | ORAL | 3 refills | Status: DC
Start: 2019-09-13 — End: 2022-08-20

## 2019-09-13 MED ORDER — ISOSORBIDE MONONITRATE ER 30 MG PO TB24
30.0000 mg | ORAL_TABLET | Freq: Every day | ORAL | 3 refills | Status: DC
Start: 2019-09-13 — End: 2022-08-20

## 2019-09-13 NOTE — Patient Instructions (Signed)
Medication Instructions: START Aspirin 81 mg daily  START Crestor 5 mg every OTHER day  START Imdur 30 mg daily   Labwork: None today  Procedures/Testing: None today  Follow-Up: 3 months Telephone visit with Dr.Koneswaran  Any Additional Special Instructions Will Be Listed Below (If Applicable).     If you need a refill on your cardiac medications before your next appointment, please call your pharmacy.     Thank you for choosing [facility] !

## 2019-09-13 NOTE — Addendum Note (Signed)
Addended by: Barbarann Ehlers A on: 09/13/2019 03:21 PM   Modules accepted: Orders

## 2019-09-13 NOTE — Progress Notes (Signed)
Virtual Visit via Telephone Note   This visit type was conducted due to national recommendations for restrictions regarding the COVID-19 Pandemic (e.g. social distancing) in an effort to limit this patient's exposure and mitigate transmission in our community.  Due to her co-morbid illnesses, this patient is at least at moderate risk for complications without adequate follow up.  This format is felt to be most appropriate for this patient at this time.  The patient did not have access to video technology/had technical difficulties with video requiring transitioning to audio format only (telephone).  All issues noted in this document were discussed and addressed.  No physical exam could be performed with this format.  Please refer to the patient's chart for her  consent to telehealth for [facility].   The patient was identified using 2 identifiers.  Date:  09/13/2019   ID:  Kathleen Vance, DOB 09-28-1950, MRN [id number]  Patient Location: Home Provider Location: Office  PCP:  Celene Squibb, MD  Cardiologist:  Kate Sable, MD  Electrophysiologist:  None   Evaluation Performed:  Follow-Up Visit  Chief Complaint: Follow-up of cardiac testing  History of Present Illness:    Kathleen Vance is a 69 y.o. female with shortness of breath, feet swelling, hypertension, and chronic kidney disease stage IV.  She is being evaluated today to go over the results of cardiac testing.  With respect to exertional dyspnea, she says she "has good days and bad days ".  She primarily gets short of breath if she overexerts herself.  She told me that the symptoms have been increasing in severity over the past year.  Her blood pressure has been elevated over the past 2 weeks.  Her nephrologist has her checking her blood pressure twice per week in order to make medication adjustments.  She has had 2 occasions where her blood pressure dropped to the 110/60 range and then she feels weak when this  occurs.  Feet swelling has resolved.  She has occasional exertional chest tightness but denies pain per se.  This also happens when she overexerts herself.    Past Medical History:  Diagnosis Date  . Aortic stenosis   . Arthritis   . 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 Gout   . History of kidney stones   . Hyperlipidemia   . Hypertension   . Hypothyroidism   . Kidney failure   . Prediabetes   . Renal artery stenosis ([place])   . Shingles rash    Past Surgical History:  Procedure Laterality Date  . ANKLE SURGERY Right   . CATARACT EXTRACTION Bilateral   . CESAREAN SECTION    . CHOLECYSTECTOMY    . DILATION AND CURETTAGE OF UTERUS    . PARTIAL HYSTERECTOMY    . REPAIR KNEE LIGAMENT Right   . TUBAL LIGATION       Current Meds  Medication Sig  . allopurinol (ZYLOPRIM) 100 MG tablet Take 100 mg by mouth every morning.  . ergocalciferol (VITAMIN D2) 1.25 MG (50000 UT) capsule Take by mouth.  . furosemide (LASIX) 20 MG tablet Take 20 mg by mouth in the morning and at bedtime.   . hydrALAZINE (APRESOLINE) 50 MG tablet Take 1 tablet (50 mg total) by mouth 3 (three) times daily.  Marland Kitchen HYDROcodone-acetaminophen (NORCO) 7.5-325 MG tablet Take 1 tablet by mouth 3 (three) times daily as needed for moderate pain.  Marland Kitchen labetalol (NORMODYNE) 200 MG tablet Take 1 tablet (200 mg total) by mouth 2 (  two) times daily.  Marland Kitchen levothyroxine (SYNTHROID) 150 MCG tablet Take 150 mcg by mouth daily before breakfast.  . losartan (COZAAR) 100 MG tablet Take 100 mg by mouth daily.  Marland Kitchen SSD 1 % cream Apply 1 application topically 2 (two) times daily as needed.  . [DISCONTINUED] ondansetron (ZOFRAN ODT) 4 MG disintegrating tablet Take 1 tablet (4 mg total) by mouth every 8 (eight) hours as needed.     Allergies:   Bactrim [sulfamethoxazole-trimethoprim], Ciprofloxacin, Nitrofurantoin, Metronidazole, Amoxicillin, and Erythromycin   Social History   Tobacco Use  . Smoking status: Never Smoker  . Smokeless  tobacco: Never Used  Substance Use Topics  . Alcohol use: Yes    Comment: rarely  . Drug use: Never     Family Hx: The patient's family history includes CAD in her father, maternal grandmother, and mother; Diabetes in her maternal grandfather; Heart attack in her maternal grandmother, mother, and paternal grandfather; Other in her brother and paternal grandmother; Pneumonia in her sister.  ROS:   Please see the history of present illness.     All other systems reviewed and are negative.   Prior CV studies:   The following studies were reviewed today:  Echocardiogram 08/26/2019:  1. Left ventricular ejection fraction, by estimation, is 45 to 50%. The  left ventricle has mildly decreased function. The left ventricle  demonstrates global hypokinesis. There is moderate left ventricular  hypertrophy. Left ventricular diastolic  parameters are consistent with Grade III diastolic dysfunction  (restrictive). Elevated left atrial pressure.  2. Right ventricular systolic function is normal. The right ventricular  size is normal. There is mildly elevated pulmonary artery systolic  pressure.  3. Left atrial size was severely dilated.  4. Right atrial size was moderately dilated.  5. The mitral valve is normal in structure. No evidence of mitral valve  regurgitation. No evidence of mitral stenosis.  6. The aortic valve is tricuspid. Aortic valve regurgitation is not  visualized. No aortic stenosis is present.  7. The inferior vena cava is normal in size with greater than 50%  respiratory variability, suggesting right atrial pressure of 3 mmHg.    Nuclear stress test 08/26/2019:   There was no ST segment deviation noted during stress.  Findings consistent with prior anterolateral myocardial infarction with moderate peri-infarct ischemia.  The left ventricular ejection fraction is moderately decreased (30-44%).  This is an intermediate risk study.    Renal artery Dopplers  06/03/2016:  - Technically difficult study due to overlying bowel gas and body  habitus.  - Bilateral abnormal intrarenal resistive indices.  - No evidence of right renal artery stenosis.  - 1-59% left renal artery stenosis.  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    Labs/Other Tests and Data Reviewed:    EKG:  No ECG reviewed.  Recent Labs: No results found for requested labs within last 8760 hours.   Recent Lipid Panel No results found for: CHOL, TRIG, HDL, CHOLHDL, LDLCALC, LDLDIRECT  Wt Readings from Last 3 Encounters:  09/13/19 194 lb (88 kg)  08/12/19 195 lb (88.5 kg)  05/13/19 204 lb (92.5 kg)     Objective:    Vital Signs:  BP (!) 159/93   Pulse 66   Ht 5\' 4"  (1.626 m)   Wt 194 lb (88 kg)   BMI 33.30 kg/m    VITAL SIGNS:  reviewed  ASSESSMENT & PLAN:    1.  Chronic combined heart failure:  Echocardiogram in March 2021 demonstrated mildly reduced LV systolic function,  LVEF 45 to 50%, with grade 3 diastolic dysfunction/restrictive physiology and global hypokinesis with moderate LVH.  Nuclear stress test was also abnormal suggestive of an ischemic etiology, although she also has hypertensive heart disease.  Currently on Lasix 20 mg twice daily as prescribed by her PCP.  She had been on 20 mg 3 times daily.  Currently on labetalol 200 mg twice daily, losartan 100 mg daily, and hydralazine 50 mg 3 times daily. I will start Imdur 30 mg daily.  2.  Abnormal nuclear stress test/presumed coronary artery disease: Findings seen on nuclear stress test in April 2021 consistent with prior anterolateral infarct with moderate peri-infarct ischemia.  Primary symptoms relate to exertional dyspnea.  LV systolic function is mildly reduced, 45 to 50%, by echocardiogram in March 2021. She also has chronic kidney disease stage IV and would be at high risk for contrast mediated nephropathy if she were to undergo cardiac catheterization.  She prefers to avoid coronary angiography. She is on a  beta-blocker.  I will start aspirin 81 mg daily. She has developed myalgias with statins in the past. I will try low dose rosuvastatin 5 mg every other day.  I will also start Imdur 30 mg daily.  3.  Hypertension: Blood pressure remains elevated. Currently on labetalol 200 mg twice daily, losartan 100 mg daily, and hydralazine 50 mg 3 times daily.  I will start Imdur 30 mg daily given CHF.  Her nephrologist is having the patient check her blood pressure twice daily so that he can make some medication adjustments.  4. Chronic kidney disease stage IV:  Currently on Lasix 20 mg twice daily.  Followed by nephrology (Dr. Theador Hawthorne). Creatinine 1.7 on 07/15/2019.      COVID-19 Education: The signs and symptoms of COVID-19 were discussed with the patient and how to seek care for testing (follow up with PCP or arrange E-visit).  The importance of social distancing was discussed today.  Time:   Today, I have spent 25 minutes with the patient with telehealth technology discussing the above problems.     Medication Adjustments/Labs and Tests Ordered: Current medicines are reviewed at length with the patient today.  Concerns regarding medicines are outlined above.   Tests Ordered: No orders of the defined types were placed in this encounter.   Medication Changes: No orders of the defined types were placed in this encounter.   Follow Up:  Virtual Visit  in 3 month(s)  Signed, Kate Sable, MD  09/13/2019 2:40 PM    [place]

## 2019-10-27 DIAGNOSIS — M792 Neuralgia and neuritis, unspecified: Secondary | ICD-10-CM | POA: Diagnosis not present

## 2019-10-27 DIAGNOSIS — B0223 Postherpetic polyneuropathy: Secondary | ICD-10-CM | POA: Diagnosis not present

## 2019-10-27 DIAGNOSIS — R140 Abdominal distension (gaseous): Secondary | ICD-10-CM | POA: Diagnosis not present

## 2019-10-27 DIAGNOSIS — N184 Chronic kidney disease, stage 4 (severe): Secondary | ICD-10-CM | POA: Diagnosis not present

## 2019-10-27 DIAGNOSIS — G4733 Obstructive sleep apnea (adult) (pediatric): Secondary | ICD-10-CM | POA: Diagnosis not present

## 2019-11-04 DIAGNOSIS — I129 Hypertensive chronic kidney disease with stage 1 through stage 4 chronic kidney disease, or unspecified chronic kidney disease: Secondary | ICD-10-CM | POA: Diagnosis not present

## 2019-11-04 DIAGNOSIS — I5042 Chronic combined systolic (congestive) and diastolic (congestive) heart failure: Secondary | ICD-10-CM | POA: Diagnosis not present

## 2019-11-04 DIAGNOSIS — E211 Secondary hyperparathyroidism, not elsewhere classified: Secondary | ICD-10-CM | POA: Diagnosis not present

## 2019-11-04 DIAGNOSIS — E559 Vitamin D deficiency, unspecified: Secondary | ICD-10-CM | POA: Diagnosis not present

## 2019-11-04 DIAGNOSIS — N1832 Chronic kidney disease, stage 3b: Secondary | ICD-10-CM | POA: Diagnosis not present

## 2019-11-08 ENCOUNTER — Telehealth: Payer: Self-pay

## 2019-11-08 NOTE — Telephone Encounter (Signed)
Patient called stating that she was seen Friday 7/2 by her Kidney doctor. States that he increased her BP medication. Patient is concerned about this. She also wants to go ahead and schedule her Cath.

## 2019-11-11 NOTE — Telephone Encounter (Signed)
Tried to reach pt several times and left messages - pt called back and said she wanted pcp to refer her to another cardiologist

## 2019-11-16 ENCOUNTER — Telehealth: Payer: Medicare Other | Admitting: Cardiovascular Disease

## 2019-11-16 DIAGNOSIS — I5032 Chronic diastolic (congestive) heart failure: Secondary | ICD-10-CM | POA: Diagnosis not present

## 2019-11-16 DIAGNOSIS — I129 Hypertensive chronic kidney disease with stage 1 through stage 4 chronic kidney disease, or unspecified chronic kidney disease: Secondary | ICD-10-CM | POA: Diagnosis not present

## 2019-11-16 DIAGNOSIS — N184 Chronic kidney disease, stage 4 (severe): Secondary | ICD-10-CM | POA: Diagnosis not present

## 2019-11-16 DIAGNOSIS — D631 Anemia in chronic kidney disease: Secondary | ICD-10-CM | POA: Diagnosis not present

## 2019-11-21 DIAGNOSIS — H26493 Other secondary cataract, bilateral: Secondary | ICD-10-CM | POA: Diagnosis not present

## 2019-11-21 DIAGNOSIS — H524 Presbyopia: Secondary | ICD-10-CM | POA: Diagnosis not present

## 2019-11-21 DIAGNOSIS — Z961 Presence of intraocular lens: Secondary | ICD-10-CM | POA: Diagnosis not present

## 2019-11-21 DIAGNOSIS — E119 Type 2 diabetes mellitus without complications: Secondary | ICD-10-CM | POA: Diagnosis not present

## 2019-12-12 ENCOUNTER — Telehealth: Payer: Medicare Other | Admitting: Cardiovascular Disease

## 2019-12-29 DIAGNOSIS — M1000 Idiopathic gout, unspecified site: Secondary | ICD-10-CM | POA: Diagnosis not present

## 2019-12-29 DIAGNOSIS — E039 Hypothyroidism, unspecified: Secondary | ICD-10-CM | POA: Diagnosis not present

## 2019-12-29 DIAGNOSIS — N184 Chronic kidney disease, stage 4 (severe): Secondary | ICD-10-CM | POA: Diagnosis not present

## 2019-12-29 DIAGNOSIS — G629 Polyneuropathy, unspecified: Secondary | ICD-10-CM | POA: Diagnosis not present

## 2019-12-29 DIAGNOSIS — E782 Mixed hyperlipidemia: Secondary | ICD-10-CM | POA: Diagnosis not present

## 2020-01-04 DIAGNOSIS — E039 Hypothyroidism, unspecified: Secondary | ICD-10-CM | POA: Diagnosis not present

## 2020-01-04 DIAGNOSIS — D631 Anemia in chronic kidney disease: Secondary | ICD-10-CM | POA: Diagnosis not present

## 2020-01-04 DIAGNOSIS — E782 Mixed hyperlipidemia: Secondary | ICD-10-CM | POA: Diagnosis not present

## 2020-01-04 DIAGNOSIS — I5032 Chronic diastolic (congestive) heart failure: Secondary | ICD-10-CM | POA: Diagnosis not present

## 2020-01-04 DIAGNOSIS — I129 Hypertensive chronic kidney disease with stage 1 through stage 4 chronic kidney disease, or unspecified chronic kidney disease: Secondary | ICD-10-CM | POA: Diagnosis not present

## 2020-01-11 DIAGNOSIS — R809 Proteinuria, unspecified: Secondary | ICD-10-CM | POA: Diagnosis not present

## 2020-01-11 DIAGNOSIS — E211 Secondary hyperparathyroidism, not elsewhere classified: Secondary | ICD-10-CM | POA: Diagnosis not present

## 2020-01-11 DIAGNOSIS — I5042 Chronic combined systolic (congestive) and diastolic (congestive) heart failure: Secondary | ICD-10-CM | POA: Diagnosis not present

## 2020-01-11 DIAGNOSIS — N1832 Chronic kidney disease, stage 3b: Secondary | ICD-10-CM | POA: Diagnosis not present

## 2020-01-11 DIAGNOSIS — I129 Hypertensive chronic kidney disease with stage 1 through stage 4 chronic kidney disease, or unspecified chronic kidney disease: Secondary | ICD-10-CM | POA: Diagnosis not present

## 2020-03-07 DIAGNOSIS — E039 Hypothyroidism, unspecified: Secondary | ICD-10-CM | POA: Diagnosis not present

## 2020-03-07 DIAGNOSIS — G629 Polyneuropathy, unspecified: Secondary | ICD-10-CM | POA: Diagnosis not present

## 2020-03-07 DIAGNOSIS — M1000 Idiopathic gout, unspecified site: Secondary | ICD-10-CM | POA: Diagnosis not present

## 2020-03-07 DIAGNOSIS — N184 Chronic kidney disease, stage 4 (severe): Secondary | ICD-10-CM | POA: Diagnosis not present

## 2020-03-07 DIAGNOSIS — E782 Mixed hyperlipidemia: Secondary | ICD-10-CM | POA: Diagnosis not present

## 2020-03-15 DIAGNOSIS — I5042 Chronic combined systolic (congestive) and diastolic (congestive) heart failure: Secondary | ICD-10-CM | POA: Diagnosis not present

## 2020-03-15 DIAGNOSIS — E211 Secondary hyperparathyroidism, not elsewhere classified: Secondary | ICD-10-CM | POA: Diagnosis not present

## 2020-03-15 DIAGNOSIS — I129 Hypertensive chronic kidney disease with stage 1 through stage 4 chronic kidney disease, or unspecified chronic kidney disease: Secondary | ICD-10-CM | POA: Diagnosis not present

## 2020-03-15 DIAGNOSIS — R809 Proteinuria, unspecified: Secondary | ICD-10-CM | POA: Diagnosis not present

## 2020-03-15 DIAGNOSIS — N1832 Chronic kidney disease, stage 3b: Secondary | ICD-10-CM | POA: Diagnosis not present

## 2020-05-17 DIAGNOSIS — E211 Secondary hyperparathyroidism, not elsewhere classified: Secondary | ICD-10-CM | POA: Diagnosis not present

## 2020-05-17 DIAGNOSIS — I129 Hypertensive chronic kidney disease with stage 1 through stage 4 chronic kidney disease, or unspecified chronic kidney disease: Secondary | ICD-10-CM | POA: Diagnosis not present

## 2020-05-17 DIAGNOSIS — R809 Proteinuria, unspecified: Secondary | ICD-10-CM | POA: Diagnosis not present

## 2020-05-17 DIAGNOSIS — N1832 Chronic kidney disease, stage 3b: Secondary | ICD-10-CM | POA: Diagnosis not present

## 2020-05-17 DIAGNOSIS — I5042 Chronic combined systolic (congestive) and diastolic (congestive) heart failure: Secondary | ICD-10-CM | POA: Diagnosis not present

## 2020-06-28 DIAGNOSIS — I5042 Chronic combined systolic (congestive) and diastolic (congestive) heart failure: Secondary | ICD-10-CM | POA: Diagnosis not present

## 2020-06-28 DIAGNOSIS — R809 Proteinuria, unspecified: Secondary | ICD-10-CM | POA: Diagnosis not present

## 2020-06-28 DIAGNOSIS — N184 Chronic kidney disease, stage 4 (severe): Secondary | ICD-10-CM | POA: Diagnosis not present

## 2020-06-28 DIAGNOSIS — D631 Anemia in chronic kidney disease: Secondary | ICD-10-CM | POA: Diagnosis not present

## 2020-06-28 DIAGNOSIS — N189 Chronic kidney disease, unspecified: Secondary | ICD-10-CM | POA: Diagnosis not present

## 2020-06-28 DIAGNOSIS — I129 Hypertensive chronic kidney disease with stage 1 through stage 4 chronic kidney disease, or unspecified chronic kidney disease: Secondary | ICD-10-CM | POA: Diagnosis not present

## 2020-07-05 DIAGNOSIS — I10 Essential (primary) hypertension: Secondary | ICD-10-CM | POA: Diagnosis not present

## 2020-07-05 DIAGNOSIS — E039 Hypothyroidism, unspecified: Secondary | ICD-10-CM | POA: Diagnosis not present

## 2020-07-05 DIAGNOSIS — D638 Anemia in other chronic diseases classified elsewhere: Secondary | ICD-10-CM | POA: Diagnosis not present

## 2020-07-05 DIAGNOSIS — E782 Mixed hyperlipidemia: Secondary | ICD-10-CM | POA: Diagnosis not present

## 2020-07-05 DIAGNOSIS — M1000 Idiopathic gout, unspecified site: Secondary | ICD-10-CM | POA: Diagnosis not present

## 2020-07-05 DIAGNOSIS — Z7689 Persons encountering health services in other specified circumstances: Secondary | ICD-10-CM | POA: Diagnosis not present

## 2020-07-05 DIAGNOSIS — G629 Polyneuropathy, unspecified: Secondary | ICD-10-CM | POA: Diagnosis not present

## 2020-07-05 DIAGNOSIS — M109 Gout, unspecified: Secondary | ICD-10-CM | POA: Diagnosis not present

## 2020-07-05 DIAGNOSIS — N184 Chronic kidney disease, stage 4 (severe): Secondary | ICD-10-CM | POA: Diagnosis not present

## 2020-07-05 DIAGNOSIS — Z0000 Encounter for general adult medical examination without abnormal findings: Secondary | ICD-10-CM | POA: Diagnosis not present

## 2020-07-10 ENCOUNTER — Other Ambulatory Visit (HOSPITAL_COMMUNITY): Payer: Self-pay | Admitting: Internal Medicine

## 2020-07-10 DIAGNOSIS — I5032 Chronic diastolic (congestive) heart failure: Secondary | ICD-10-CM | POA: Diagnosis not present

## 2020-07-10 DIAGNOSIS — M5137 Other intervertebral disc degeneration, lumbosacral region: Secondary | ICD-10-CM | POA: Diagnosis not present

## 2020-07-10 DIAGNOSIS — G4733 Obstructive sleep apnea (adult) (pediatric): Secondary | ICD-10-CM | POA: Diagnosis not present

## 2020-07-10 DIAGNOSIS — I129 Hypertensive chronic kidney disease with stage 1 through stage 4 chronic kidney disease, or unspecified chronic kidney disease: Secondary | ICD-10-CM | POA: Diagnosis not present

## 2020-07-10 DIAGNOSIS — B0223 Postherpetic polyneuropathy: Secondary | ICD-10-CM | POA: Diagnosis not present

## 2020-07-10 DIAGNOSIS — D631 Anemia in chronic kidney disease: Secondary | ICD-10-CM | POA: Diagnosis not present

## 2020-07-10 DIAGNOSIS — E039 Hypothyroidism, unspecified: Secondary | ICD-10-CM | POA: Diagnosis not present

## 2020-07-10 DIAGNOSIS — Z13820 Encounter for screening for osteoporosis: Secondary | ICD-10-CM

## 2020-07-10 DIAGNOSIS — E782 Mixed hyperlipidemia: Secondary | ICD-10-CM | POA: Diagnosis not present

## 2020-07-10 DIAGNOSIS — M109 Gout, unspecified: Secondary | ICD-10-CM | POA: Diagnosis not present

## 2020-07-10 DIAGNOSIS — M25552 Pain in left hip: Secondary | ICD-10-CM | POA: Diagnosis not present

## 2020-07-13 ENCOUNTER — Ambulatory Visit (HOSPITAL_COMMUNITY)
Admission: RE | Admit: 2020-07-13 | Discharge: 2020-07-13 | Disposition: A | Discharge status: Home / Self Care | Payer: Medicare Other | Source: Ambulatory Visit | Attending: Internal Medicine | Admitting: Internal Medicine

## 2020-07-13 DIAGNOSIS — Z13820 Encounter for screening for osteoporosis: Secondary | ICD-10-CM | POA: Diagnosis not present

## 2020-07-13 DIAGNOSIS — Z780 Asymptomatic menopausal state: Secondary | ICD-10-CM | POA: Diagnosis not present

## 2020-07-20 DIAGNOSIS — E039 Hypothyroidism, unspecified: Secondary | ICD-10-CM | POA: Diagnosis not present

## 2020-07-20 DIAGNOSIS — M1000 Idiopathic gout, unspecified site: Secondary | ICD-10-CM | POA: Diagnosis not present

## 2020-07-20 DIAGNOSIS — N184 Chronic kidney disease, stage 4 (severe): Secondary | ICD-10-CM | POA: Diagnosis not present

## 2020-07-20 DIAGNOSIS — G629 Polyneuropathy, unspecified: Secondary | ICD-10-CM | POA: Diagnosis not present

## 2020-07-20 DIAGNOSIS — E782 Mixed hyperlipidemia: Secondary | ICD-10-CM | POA: Diagnosis not present

## 2020-07-26 DIAGNOSIS — E211 Secondary hyperparathyroidism, not elsewhere classified: Secondary | ICD-10-CM | POA: Diagnosis not present

## 2020-07-26 DIAGNOSIS — N170 Acute kidney failure with tubular necrosis: Secondary | ICD-10-CM | POA: Diagnosis not present

## 2020-07-26 DIAGNOSIS — N184 Chronic kidney disease, stage 4 (severe): Secondary | ICD-10-CM | POA: Diagnosis not present

## 2020-07-26 DIAGNOSIS — I129 Hypertensive chronic kidney disease with stage 1 through stage 4 chronic kidney disease, or unspecified chronic kidney disease: Secondary | ICD-10-CM | POA: Diagnosis not present

## 2020-07-26 DIAGNOSIS — E872 Acidosis: Secondary | ICD-10-CM | POA: Diagnosis not present

## 2020-07-26 DIAGNOSIS — I5042 Chronic combined systolic (congestive) and diastolic (congestive) heart failure: Secondary | ICD-10-CM | POA: Diagnosis not present

## 2020-08-03 DIAGNOSIS — M1000 Idiopathic gout, unspecified site: Secondary | ICD-10-CM | POA: Diagnosis not present

## 2020-08-03 DIAGNOSIS — E782 Mixed hyperlipidemia: Secondary | ICD-10-CM | POA: Diagnosis not present

## 2020-08-03 DIAGNOSIS — G629 Polyneuropathy, unspecified: Secondary | ICD-10-CM | POA: Diagnosis not present

## 2020-08-03 DIAGNOSIS — N184 Chronic kidney disease, stage 4 (severe): Secondary | ICD-10-CM | POA: Diagnosis not present

## 2020-08-03 DIAGNOSIS — E039 Hypothyroidism, unspecified: Secondary | ICD-10-CM | POA: Diagnosis not present

## 2020-08-10 DIAGNOSIS — E211 Secondary hyperparathyroidism, not elsewhere classified: Secondary | ICD-10-CM | POA: Diagnosis not present

## 2020-08-10 DIAGNOSIS — I5042 Chronic combined systolic (congestive) and diastolic (congestive) heart failure: Secondary | ICD-10-CM | POA: Diagnosis not present

## 2020-08-10 DIAGNOSIS — D631 Anemia in chronic kidney disease: Secondary | ICD-10-CM | POA: Diagnosis not present

## 2020-08-10 DIAGNOSIS — N170 Acute kidney failure with tubular necrosis: Secondary | ICD-10-CM | POA: Diagnosis not present

## 2020-08-10 DIAGNOSIS — E872 Acidosis: Secondary | ICD-10-CM | POA: Diagnosis not present

## 2020-08-10 DIAGNOSIS — N184 Chronic kidney disease, stage 4 (severe): Secondary | ICD-10-CM | POA: Diagnosis not present

## 2020-08-10 DIAGNOSIS — I129 Hypertensive chronic kidney disease with stage 1 through stage 4 chronic kidney disease, or unspecified chronic kidney disease: Secondary | ICD-10-CM | POA: Diagnosis not present

## 2020-08-10 DIAGNOSIS — N189 Chronic kidney disease, unspecified: Secondary | ICD-10-CM | POA: Diagnosis not present

## 2020-08-28 DIAGNOSIS — K047 Periapical abscess without sinus: Secondary | ICD-10-CM | POA: Diagnosis not present

## 2020-09-19 DIAGNOSIS — N184 Chronic kidney disease, stage 4 (severe): Secondary | ICD-10-CM | POA: Diagnosis not present

## 2020-09-19 DIAGNOSIS — E872 Acidosis: Secondary | ICD-10-CM | POA: Diagnosis not present

## 2020-09-19 DIAGNOSIS — I129 Hypertensive chronic kidney disease with stage 1 through stage 4 chronic kidney disease, or unspecified chronic kidney disease: Secondary | ICD-10-CM | POA: Diagnosis not present

## 2020-09-19 DIAGNOSIS — I5042 Chronic combined systolic (congestive) and diastolic (congestive) heart failure: Secondary | ICD-10-CM | POA: Diagnosis not present

## 2020-09-19 DIAGNOSIS — N170 Acute kidney failure with tubular necrosis: Secondary | ICD-10-CM | POA: Diagnosis not present

## 2020-09-27 DIAGNOSIS — I129 Hypertensive chronic kidney disease with stage 1 through stage 4 chronic kidney disease, or unspecified chronic kidney disease: Secondary | ICD-10-CM | POA: Diagnosis not present

## 2020-09-27 DIAGNOSIS — N184 Chronic kidney disease, stage 4 (severe): Secondary | ICD-10-CM | POA: Diagnosis not present

## 2020-09-27 DIAGNOSIS — I5042 Chronic combined systolic (congestive) and diastolic (congestive) heart failure: Secondary | ICD-10-CM | POA: Diagnosis not present

## 2020-09-27 DIAGNOSIS — E211 Secondary hyperparathyroidism, not elsewhere classified: Secondary | ICD-10-CM | POA: Diagnosis not present

## 2020-09-27 DIAGNOSIS — N189 Chronic kidney disease, unspecified: Secondary | ICD-10-CM | POA: Diagnosis not present

## 2020-09-27 DIAGNOSIS — D631 Anemia in chronic kidney disease: Secondary | ICD-10-CM | POA: Diagnosis not present

## 2020-09-27 DIAGNOSIS — R809 Proteinuria, unspecified: Secondary | ICD-10-CM | POA: Diagnosis not present

## 2020-11-07 DIAGNOSIS — E039 Hypothyroidism, unspecified: Secondary | ICD-10-CM | POA: Diagnosis not present

## 2020-11-07 DIAGNOSIS — I10 Essential (primary) hypertension: Secondary | ICD-10-CM | POA: Diagnosis not present

## 2020-11-12 DIAGNOSIS — D631 Anemia in chronic kidney disease: Secondary | ICD-10-CM | POA: Insufficient documentation

## 2020-11-22 DIAGNOSIS — I5042 Chronic combined systolic (congestive) and diastolic (congestive) heart failure: Secondary | ICD-10-CM | POA: Diagnosis not present

## 2020-11-22 DIAGNOSIS — N184 Chronic kidney disease, stage 4 (severe): Secondary | ICD-10-CM | POA: Diagnosis not present

## 2020-11-22 DIAGNOSIS — R809 Proteinuria, unspecified: Secondary | ICD-10-CM | POA: Diagnosis not present

## 2020-11-22 DIAGNOSIS — N189 Chronic kidney disease, unspecified: Secondary | ICD-10-CM | POA: Diagnosis not present

## 2020-11-22 DIAGNOSIS — I129 Hypertensive chronic kidney disease with stage 1 through stage 4 chronic kidney disease, or unspecified chronic kidney disease: Secondary | ICD-10-CM | POA: Diagnosis not present

## 2020-11-27 DIAGNOSIS — I129 Hypertensive chronic kidney disease with stage 1 through stage 4 chronic kidney disease, or unspecified chronic kidney disease: Secondary | ICD-10-CM | POA: Diagnosis not present

## 2020-11-27 DIAGNOSIS — I5032 Chronic diastolic (congestive) heart failure: Secondary | ICD-10-CM | POA: Diagnosis not present

## 2020-11-27 DIAGNOSIS — D631 Anemia in chronic kidney disease: Secondary | ICD-10-CM | POA: Diagnosis not present

## 2020-11-27 DIAGNOSIS — Z0001 Encounter for general adult medical examination with abnormal findings: Secondary | ICD-10-CM | POA: Diagnosis not present

## 2020-11-27 DIAGNOSIS — B0223 Postherpetic polyneuropathy: Secondary | ICD-10-CM | POA: Diagnosis not present

## 2020-11-27 DIAGNOSIS — M5137 Other intervertebral disc degeneration, lumbosacral region: Secondary | ICD-10-CM | POA: Diagnosis not present

## 2020-11-27 DIAGNOSIS — M25552 Pain in left hip: Secondary | ICD-10-CM | POA: Diagnosis not present

## 2020-11-27 DIAGNOSIS — M109 Gout, unspecified: Secondary | ICD-10-CM | POA: Diagnosis not present

## 2020-11-27 DIAGNOSIS — G4733 Obstructive sleep apnea (adult) (pediatric): Secondary | ICD-10-CM | POA: Diagnosis not present

## 2020-11-27 DIAGNOSIS — N184 Chronic kidney disease, stage 4 (severe): Secondary | ICD-10-CM | POA: Diagnosis not present

## 2020-11-27 DIAGNOSIS — E782 Mixed hyperlipidemia: Secondary | ICD-10-CM | POA: Diagnosis not present

## 2020-11-27 DIAGNOSIS — E039 Hypothyroidism, unspecified: Secondary | ICD-10-CM | POA: Diagnosis not present

## 2020-11-29 DIAGNOSIS — N189 Chronic kidney disease, unspecified: Secondary | ICD-10-CM | POA: Diagnosis not present

## 2020-11-29 DIAGNOSIS — E875 Hyperkalemia: Secondary | ICD-10-CM | POA: Diagnosis not present

## 2020-11-29 DIAGNOSIS — I129 Hypertensive chronic kidney disease with stage 1 through stage 4 chronic kidney disease, or unspecified chronic kidney disease: Secondary | ICD-10-CM | POA: Diagnosis not present

## 2020-11-29 DIAGNOSIS — I5042 Chronic combined systolic (congestive) and diastolic (congestive) heart failure: Secondary | ICD-10-CM | POA: Diagnosis not present

## 2020-11-29 DIAGNOSIS — D631 Anemia in chronic kidney disease: Secondary | ICD-10-CM | POA: Diagnosis not present

## 2020-11-29 DIAGNOSIS — N184 Chronic kidney disease, stage 4 (severe): Secondary | ICD-10-CM | POA: Diagnosis not present

## 2020-12-10 DIAGNOSIS — I129 Hypertensive chronic kidney disease with stage 1 through stage 4 chronic kidney disease, or unspecified chronic kidney disease: Secondary | ICD-10-CM | POA: Diagnosis not present

## 2020-12-10 DIAGNOSIS — N189 Chronic kidney disease, unspecified: Secondary | ICD-10-CM | POA: Diagnosis not present

## 2020-12-10 DIAGNOSIS — N184 Chronic kidney disease, stage 4 (severe): Secondary | ICD-10-CM | POA: Diagnosis not present

## 2020-12-10 DIAGNOSIS — I5042 Chronic combined systolic (congestive) and diastolic (congestive) heart failure: Secondary | ICD-10-CM | POA: Diagnosis not present

## 2020-12-10 DIAGNOSIS — E875 Hyperkalemia: Secondary | ICD-10-CM | POA: Diagnosis not present

## 2021-01-24 DIAGNOSIS — N184 Chronic kidney disease, stage 4 (severe): Secondary | ICD-10-CM | POA: Diagnosis not present

## 2021-01-24 DIAGNOSIS — I5042 Chronic combined systolic (congestive) and diastolic (congestive) heart failure: Secondary | ICD-10-CM | POA: Diagnosis not present

## 2021-01-24 DIAGNOSIS — N189 Chronic kidney disease, unspecified: Secondary | ICD-10-CM | POA: Diagnosis not present

## 2021-01-24 DIAGNOSIS — E875 Hyperkalemia: Secondary | ICD-10-CM | POA: Diagnosis not present

## 2021-01-24 DIAGNOSIS — I129 Hypertensive chronic kidney disease with stage 1 through stage 4 chronic kidney disease, or unspecified chronic kidney disease: Secondary | ICD-10-CM | POA: Diagnosis not present

## 2021-02-01 DIAGNOSIS — R809 Proteinuria, unspecified: Secondary | ICD-10-CM | POA: Diagnosis not present

## 2021-02-01 DIAGNOSIS — N184 Chronic kidney disease, stage 4 (severe): Secondary | ICD-10-CM | POA: Diagnosis not present

## 2021-02-01 DIAGNOSIS — I129 Hypertensive chronic kidney disease with stage 1 through stage 4 chronic kidney disease, or unspecified chronic kidney disease: Secondary | ICD-10-CM | POA: Diagnosis not present

## 2021-02-01 DIAGNOSIS — I5042 Chronic combined systolic (congestive) and diastolic (congestive) heart failure: Secondary | ICD-10-CM | POA: Diagnosis not present

## 2021-02-01 DIAGNOSIS — D631 Anemia in chronic kidney disease: Secondary | ICD-10-CM | POA: Diagnosis not present

## 2021-02-01 DIAGNOSIS — N189 Chronic kidney disease, unspecified: Secondary | ICD-10-CM | POA: Diagnosis not present

## 2021-05-24 DIAGNOSIS — Z79899 Other long term (current) drug therapy: Secondary | ICD-10-CM | POA: Diagnosis not present

## 2021-05-24 DIAGNOSIS — E039 Hypothyroidism, unspecified: Secondary | ICD-10-CM | POA: Diagnosis not present

## 2021-05-24 DIAGNOSIS — Z131 Encounter for screening for diabetes mellitus: Secondary | ICD-10-CM | POA: Diagnosis not present

## 2021-05-24 DIAGNOSIS — I10 Essential (primary) hypertension: Secondary | ICD-10-CM | POA: Diagnosis not present

## 2021-05-30 DIAGNOSIS — N184 Chronic kidney disease, stage 4 (severe): Secondary | ICD-10-CM | POA: Diagnosis not present

## 2021-05-30 DIAGNOSIS — I129 Hypertensive chronic kidney disease with stage 1 through stage 4 chronic kidney disease, or unspecified chronic kidney disease: Secondary | ICD-10-CM | POA: Diagnosis not present

## 2021-05-30 DIAGNOSIS — D631 Anemia in chronic kidney disease: Secondary | ICD-10-CM | POA: Diagnosis not present

## 2021-05-30 DIAGNOSIS — R809 Proteinuria, unspecified: Secondary | ICD-10-CM | POA: Diagnosis not present

## 2021-05-30 DIAGNOSIS — L259 Unspecified contact dermatitis, unspecified cause: Secondary | ICD-10-CM | POA: Diagnosis not present

## 2021-05-30 DIAGNOSIS — G4733 Obstructive sleep apnea (adult) (pediatric): Secondary | ICD-10-CM | POA: Diagnosis not present

## 2021-05-30 DIAGNOSIS — M109 Gout, unspecified: Secondary | ICD-10-CM | POA: Diagnosis not present

## 2021-05-30 DIAGNOSIS — B0223 Postherpetic polyneuropathy: Secondary | ICD-10-CM | POA: Diagnosis not present

## 2021-05-30 DIAGNOSIS — E039 Hypothyroidism, unspecified: Secondary | ICD-10-CM | POA: Diagnosis not present

## 2021-05-30 DIAGNOSIS — M5137 Other intervertebral disc degeneration, lumbosacral region: Secondary | ICD-10-CM | POA: Diagnosis not present

## 2021-05-30 DIAGNOSIS — E782 Mixed hyperlipidemia: Secondary | ICD-10-CM | POA: Diagnosis not present

## 2021-05-30 DIAGNOSIS — I5032 Chronic diastolic (congestive) heart failure: Secondary | ICD-10-CM | POA: Diagnosis not present

## 2021-06-06 NOTE — Progress Notes (Incomplete)
CARDIOLOGY CONSULT NOTE       Patient ID: Kathleen Vance MRN: [id number] DOB/AGE: [birth year] 71 y.o.  Admit date: (Not on file) Referring Physician: Nevada Crane Primary Physician: Celene Squibb, MD Primary Cardiologist: New Reason for Consultation: Diastolic CHF  Active Problems:   * No active hospital problems. *   HPI:  71 y.o. referred by Dr Nevada Crane for diastolic CHF.  History of HTN, dyspnea, CRF, HLD and hypothyroidism.  No recent labs Cr in 2021 noted to be 1.7.  Echo done 08/26/19 suggested EF 45-50$ with grade 2 diastolic dysfunction atrial enlargement and mild elevation in PA pressure She had an abnormal myovue in August 26 2019 To my review likely breast attenuation but read as infarct with peri infarct ischemia She does not appear to have been cathed ? Due to CRF.  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[facility])    Gout    History of kidney stones    Hyperlipidemia    Hypertension    Hypothyroidism    Kidney failure    Prediabetes    Renal artery stenosis ([facility])    Shingles rash     Family History  Problem Relation Age of Onset   CAD Mother    Heart attack Mother    CAD Father    Pneumonia Sister    Other Brother        killed at age 36   CAD Maternal Grandmother    Heart attack Maternal Grandmother    Diabetes Maternal Grandfather    Other Paternal Grandmother        died after child birth   Heart attack Paternal Grandfather     Social History   Socioeconomic History   Marital status: Married    Spouse name: Not on file   Number of children: Not on file   Years of education: Not on file   Highest education level: Not on file  Occupational History    Not on file  Tobacco Use   Smoking status: Never   Smokeless tobacco: Never  Vaping Use   Vaping Use: Never used  Substance and Sexual Activity   Alcohol use: Yes    Comment: rarely   Drug use: Never   Sexual activity: Not on file  Other Topics Concern   Not on file  Social History Narrative   Not on file   Social Determinants of Health   Financial Resource Strain: Not on file  Food Insecurity: Not on file  Transportation Needs: Not on file  Physical Activity: Not on file  Stress: Not on file  Social Connections: Not on file  Intimate Partner Violence: Not on file    Past Surgical History:  Procedure Laterality Date   ANKLE SURGERY Right    CATARACT EXTRACTION Bilateral    CESAREAN SECTION     CHOLECYSTECTOMY     DILATION AND CURETTAGE OF UTERUS     PARTIAL HYSTERECTOMY     REPAIR KNEE LIGAMENT Right    TUBAL LIGATION        Current Outpatient Medications:    allopurinol (ZYLOPRIM) 100 MG tablet, Take 100 mg  by mouth every morning., Disp: , Rfl:    aspirin EC 81 MG tablet, Take 1 tablet (81 mg total) by mouth daily., Disp: 90 tablet, Rfl: 3   ergocalciferol (VITAMIN D2) 1.25 MG (50000 UT) capsule, Take by mouth., Disp: , Rfl:    furosemide (LASIX) 20 MG tablet, Take 20 mg by mouth in the morning and at bedtime. , Disp: , Rfl:    hydrALAZINE (APRESOLINE) 50 MG tablet, Take 1 tablet (50 mg total) by mouth 3 (three) times daily., Disp: 270 tablet, Rfl: 3   HYDROcodone-acetaminophen (NORCO) 7.5-325 MG tablet, Take 1 tablet by mouth 3 (three) times daily as needed for moderate pain., Disp: , Rfl:    isosorbide mononitrate (IMDUR) 30 MG 24 hr tablet, Take 1 tablet (30 mg total) by mouth daily., Disp: 90 tablet, Rfl: 3   labetalol (NORMODYNE) 200 MG tablet, Take 1 tablet (200 mg total) by mouth 2 (two) times daily., Disp: 60 tablet, Rfl: 0   levothyroxine (SYNTHROID) 150 MCG tablet, Take 150 mcg by mouth daily before breakfast., Disp: , Rfl:     losartan (COZAAR) 100 MG tablet, Take 100 mg by mouth daily., Disp: , Rfl:    rosuvastatin (CRESTOR) 5 MG tablet, Take 1 tablet (5 mg total) by mouth daily., Disp: 90 tablet, Rfl: 3   SSD 1 % cream, Apply 1 application topically 2 (two) times daily as needed., Disp: , Rfl:     Physical Exam: There were no vitals taken for this visit. *** HELP TEXT ***  This SmartLink requires parameters. Parameters are variables that are added to the [facility] name to request specific information. The parameter for .curwt is the number of encounters to display readings from.  For example: .curwt[4  In this example, the SmartLink displays readings from the last four encounters.    {physical EQAS:3419622}  Labs:   Lab Results  Component Value Date   WBC 11.8 (H) 08/18/2018   HGB 12.3 08/18/2018   HCT 38.0 08/18/2018   MCV 97.2 08/18/2018   PLT 250 08/18/2018   No results for input(s): NA, K, CL, CO2, BUN, CREATININE, CALCIUM, PROT, BILITOT, ALKPHOS, ALT, AST, GLUCOSE in the last 168 hours.  Invalid input(s): LABALBU Lab Results  Component Value Date   TROPONINI <0.03 08/18/2018   No results found for: CHOL No results found for: HDL No results found for: LDLCALC No results found for: TRIG No results found for: CHOLHDL No results found for: LDLDIRECT    Radiology: No results found.  EKG: 2021 SR rate 78 LVH with strain    ASSESSMENT AND PLAN:   Diastolic CHF:  this appears to be more an issue of obesity, CRF stage 4 and secondary renal HTN.  Update echo continue aggressive Rx of HTN and diuretic per Dr Theador Hawthorne renal  Abnormal Myovue 08/26/19 likely breast attenuation With no chest pain and stage 4 CRF would not cath Update echo and only consider if EF much worse  HLD:  continue statin  Thyroid:  continue synthroid replacement   Echo  F/U cardiology PRN  Signed: Jenkins Rouge 06/06/2021, 4:49 PM

## 2021-06-13 ENCOUNTER — Ambulatory Visit: Payer: Medicare Other | Admitting: Cardiovascular Disease

## 2021-06-18 DIAGNOSIS — R809 Proteinuria, unspecified: Secondary | ICD-10-CM | POA: Diagnosis not present

## 2021-06-18 DIAGNOSIS — I129 Hypertensive chronic kidney disease with stage 1 through stage 4 chronic kidney disease, or unspecified chronic kidney disease: Secondary | ICD-10-CM | POA: Diagnosis not present

## 2021-06-18 DIAGNOSIS — N184 Chronic kidney disease, stage 4 (severe): Secondary | ICD-10-CM | POA: Diagnosis not present

## 2021-06-18 DIAGNOSIS — I5042 Chronic combined systolic (congestive) and diastolic (congestive) heart failure: Secondary | ICD-10-CM | POA: Diagnosis not present

## 2021-08-09 DIAGNOSIS — N189 Chronic kidney disease, unspecified: Secondary | ICD-10-CM | POA: Diagnosis not present

## 2021-08-09 DIAGNOSIS — I5042 Chronic combined systolic (congestive) and diastolic (congestive) heart failure: Secondary | ICD-10-CM | POA: Diagnosis not present

## 2021-08-09 DIAGNOSIS — N184 Chronic kidney disease, stage 4 (severe): Secondary | ICD-10-CM | POA: Diagnosis not present

## 2021-08-09 DIAGNOSIS — I129 Hypertensive chronic kidney disease with stage 1 through stage 4 chronic kidney disease, or unspecified chronic kidney disease: Secondary | ICD-10-CM | POA: Diagnosis not present

## 2021-08-09 DIAGNOSIS — E211 Secondary hyperparathyroidism, not elsewhere classified: Secondary | ICD-10-CM | POA: Diagnosis not present

## 2021-09-04 DIAGNOSIS — I129 Hypertensive chronic kidney disease with stage 1 through stage 4 chronic kidney disease, or unspecified chronic kidney disease: Secondary | ICD-10-CM | POA: Diagnosis not present

## 2021-09-04 DIAGNOSIS — N184 Chronic kidney disease, stage 4 (severe): Secondary | ICD-10-CM | POA: Diagnosis not present

## 2021-09-04 DIAGNOSIS — E875 Hyperkalemia: Secondary | ICD-10-CM | POA: Diagnosis not present

## 2021-09-04 DIAGNOSIS — R809 Proteinuria, unspecified: Secondary | ICD-10-CM | POA: Diagnosis not present

## 2021-09-04 DIAGNOSIS — N189 Chronic kidney disease, unspecified: Secondary | ICD-10-CM | POA: Diagnosis not present

## 2021-09-04 DIAGNOSIS — I5042 Chronic combined systolic (congestive) and diastolic (congestive) heart failure: Secondary | ICD-10-CM | POA: Diagnosis not present

## 2021-09-04 DIAGNOSIS — D631 Anemia in chronic kidney disease: Secondary | ICD-10-CM | POA: Diagnosis not present

## 2021-11-07 DIAGNOSIS — I5042 Chronic combined systolic (congestive) and diastolic (congestive) heart failure: Secondary | ICD-10-CM | POA: Diagnosis not present

## 2021-11-07 DIAGNOSIS — N184 Chronic kidney disease, stage 4 (severe): Secondary | ICD-10-CM | POA: Diagnosis not present

## 2021-11-07 DIAGNOSIS — N189 Chronic kidney disease, unspecified: Secondary | ICD-10-CM | POA: Diagnosis not present

## 2021-11-07 DIAGNOSIS — I129 Hypertensive chronic kidney disease with stage 1 through stage 4 chronic kidney disease, or unspecified chronic kidney disease: Secondary | ICD-10-CM | POA: Diagnosis not present

## 2021-11-07 DIAGNOSIS — R809 Proteinuria, unspecified: Secondary | ICD-10-CM | POA: Diagnosis not present

## 2021-11-14 DIAGNOSIS — R809 Proteinuria, unspecified: Secondary | ICD-10-CM | POA: Diagnosis not present

## 2021-11-14 DIAGNOSIS — N189 Chronic kidney disease, unspecified: Secondary | ICD-10-CM | POA: Diagnosis not present

## 2021-11-14 DIAGNOSIS — N184 Chronic kidney disease, stage 4 (severe): Secondary | ICD-10-CM | POA: Diagnosis not present

## 2021-11-14 DIAGNOSIS — I129 Hypertensive chronic kidney disease with stage 1 through stage 4 chronic kidney disease, or unspecified chronic kidney disease: Secondary | ICD-10-CM | POA: Diagnosis not present

## 2021-11-14 DIAGNOSIS — D631 Anemia in chronic kidney disease: Secondary | ICD-10-CM | POA: Diagnosis not present

## 2021-11-14 DIAGNOSIS — L299 Pruritus, unspecified: Secondary | ICD-10-CM | POA: Diagnosis not present

## 2021-11-14 DIAGNOSIS — I5042 Chronic combined systolic (congestive) and diastolic (congestive) heart failure: Secondary | ICD-10-CM | POA: Diagnosis not present

## 2021-11-21 DIAGNOSIS — Z136 Encounter for screening for cardiovascular disorders: Secondary | ICD-10-CM | POA: Diagnosis not present

## 2021-11-21 DIAGNOSIS — E039 Hypothyroidism, unspecified: Secondary | ICD-10-CM | POA: Diagnosis not present

## 2021-11-21 DIAGNOSIS — Z0000 Encounter for general adult medical examination without abnormal findings: Secondary | ICD-10-CM | POA: Diagnosis not present

## 2021-11-26 DIAGNOSIS — E119 Type 2 diabetes mellitus without complications: Secondary | ICD-10-CM | POA: Diagnosis not present

## 2021-11-26 DIAGNOSIS — Z961 Presence of intraocular lens: Secondary | ICD-10-CM | POA: Diagnosis not present

## 2021-11-26 DIAGNOSIS — H40011 Open angle with borderline findings, low risk, right eye: Secondary | ICD-10-CM | POA: Diagnosis not present

## 2021-11-27 DIAGNOSIS — G4733 Obstructive sleep apnea (adult) (pediatric): Secondary | ICD-10-CM | POA: Diagnosis not present

## 2021-11-27 DIAGNOSIS — I129 Hypertensive chronic kidney disease with stage 1 through stage 4 chronic kidney disease, or unspecified chronic kidney disease: Secondary | ICD-10-CM | POA: Diagnosis not present

## 2021-11-27 DIAGNOSIS — L259 Unspecified contact dermatitis, unspecified cause: Secondary | ICD-10-CM | POA: Diagnosis not present

## 2021-11-27 DIAGNOSIS — M5137 Other intervertebral disc degeneration, lumbosacral region: Secondary | ICD-10-CM | POA: Diagnosis not present

## 2021-11-27 DIAGNOSIS — E782 Mixed hyperlipidemia: Secondary | ICD-10-CM | POA: Diagnosis not present

## 2021-11-27 DIAGNOSIS — R198 Other specified symptoms and signs involving the digestive system and abdomen: Secondary | ICD-10-CM | POA: Diagnosis not present

## 2021-11-27 DIAGNOSIS — E039 Hypothyroidism, unspecified: Secondary | ICD-10-CM | POA: Diagnosis not present

## 2021-11-27 DIAGNOSIS — M109 Gout, unspecified: Secondary | ICD-10-CM | POA: Diagnosis not present

## 2021-11-27 DIAGNOSIS — I5032 Chronic diastolic (congestive) heart failure: Secondary | ICD-10-CM | POA: Diagnosis not present

## 2021-11-27 DIAGNOSIS — D631 Anemia in chronic kidney disease: Secondary | ICD-10-CM | POA: Diagnosis not present

## 2021-11-27 DIAGNOSIS — L989 Disorder of the skin and subcutaneous tissue, unspecified: Secondary | ICD-10-CM | POA: Diagnosis not present

## 2021-11-27 DIAGNOSIS — N184 Chronic kidney disease, stage 4 (severe): Secondary | ICD-10-CM | POA: Diagnosis not present

## 2021-12-23 DIAGNOSIS — C44319 Basal cell carcinoma of skin of other parts of face: Secondary | ICD-10-CM | POA: Diagnosis not present

## 2021-12-23 DIAGNOSIS — C4401 Basal cell carcinoma of skin of lip: Secondary | ICD-10-CM | POA: Diagnosis not present

## 2021-12-23 DIAGNOSIS — B078 Other viral warts: Secondary | ICD-10-CM | POA: Diagnosis not present

## 2021-12-23 DIAGNOSIS — L853 Xerosis cutis: Secondary | ICD-10-CM | POA: Diagnosis not present

## 2022-02-20 DIAGNOSIS — R809 Proteinuria, unspecified: Secondary | ICD-10-CM | POA: Diagnosis not present

## 2022-02-20 DIAGNOSIS — N184 Chronic kidney disease, stage 4 (severe): Secondary | ICD-10-CM | POA: Diagnosis not present

## 2022-02-20 DIAGNOSIS — N189 Chronic kidney disease, unspecified: Secondary | ICD-10-CM | POA: Diagnosis not present

## 2022-02-20 DIAGNOSIS — I5042 Chronic combined systolic (congestive) and diastolic (congestive) heart failure: Secondary | ICD-10-CM | POA: Diagnosis not present

## 2022-02-20 DIAGNOSIS — I129 Hypertensive chronic kidney disease with stage 1 through stage 4 chronic kidney disease, or unspecified chronic kidney disease: Secondary | ICD-10-CM | POA: Diagnosis not present

## 2022-03-04 DIAGNOSIS — I5042 Chronic combined systolic (congestive) and diastolic (congestive) heart failure: Secondary | ICD-10-CM | POA: Diagnosis not present

## 2022-03-04 DIAGNOSIS — R809 Proteinuria, unspecified: Secondary | ICD-10-CM | POA: Diagnosis not present

## 2022-03-04 DIAGNOSIS — N189 Chronic kidney disease, unspecified: Secondary | ICD-10-CM | POA: Diagnosis not present

## 2022-03-04 DIAGNOSIS — I129 Hypertensive chronic kidney disease with stage 1 through stage 4 chronic kidney disease, or unspecified chronic kidney disease: Secondary | ICD-10-CM | POA: Diagnosis not present

## 2022-03-04 DIAGNOSIS — D631 Anemia in chronic kidney disease: Secondary | ICD-10-CM | POA: Diagnosis not present

## 2022-03-04 DIAGNOSIS — N184 Chronic kidney disease, stage 4 (severe): Secondary | ICD-10-CM | POA: Diagnosis not present

## 2022-03-04 DIAGNOSIS — N19 Unspecified kidney failure: Secondary | ICD-10-CM | POA: Diagnosis not present

## 2022-05-06 DIAGNOSIS — R809 Proteinuria, unspecified: Secondary | ICD-10-CM | POA: Diagnosis not present

## 2022-05-06 DIAGNOSIS — I5042 Chronic combined systolic (congestive) and diastolic (congestive) heart failure: Secondary | ICD-10-CM | POA: Diagnosis not present

## 2022-05-06 DIAGNOSIS — N189 Chronic kidney disease, unspecified: Secondary | ICD-10-CM | POA: Diagnosis not present

## 2022-05-06 DIAGNOSIS — N184 Chronic kidney disease, stage 4 (severe): Secondary | ICD-10-CM | POA: Diagnosis not present

## 2022-05-06 DIAGNOSIS — I129 Hypertensive chronic kidney disease with stage 1 through stage 4 chronic kidney disease, or unspecified chronic kidney disease: Secondary | ICD-10-CM | POA: Diagnosis not present

## 2022-05-13 DIAGNOSIS — I5042 Chronic combined systolic (congestive) and diastolic (congestive) heart failure: Secondary | ICD-10-CM | POA: Diagnosis not present

## 2022-05-13 DIAGNOSIS — D631 Anemia in chronic kidney disease: Secondary | ICD-10-CM | POA: Diagnosis not present

## 2022-05-13 DIAGNOSIS — N189 Chronic kidney disease, unspecified: Secondary | ICD-10-CM | POA: Diagnosis not present

## 2022-05-13 DIAGNOSIS — I129 Hypertensive chronic kidney disease with stage 1 through stage 4 chronic kidney disease, or unspecified chronic kidney disease: Secondary | ICD-10-CM | POA: Diagnosis not present

## 2022-05-13 DIAGNOSIS — R809 Proteinuria, unspecified: Secondary | ICD-10-CM | POA: Diagnosis not present

## 2022-05-13 DIAGNOSIS — N184 Chronic kidney disease, stage 4 (severe): Secondary | ICD-10-CM | POA: Diagnosis not present

## 2022-07-17 DIAGNOSIS — I129 Hypertensive chronic kidney disease with stage 1 through stage 4 chronic kidney disease, or unspecified chronic kidney disease: Secondary | ICD-10-CM | POA: Diagnosis not present

## 2022-07-17 DIAGNOSIS — I5042 Chronic combined systolic (congestive) and diastolic (congestive) heart failure: Secondary | ICD-10-CM | POA: Diagnosis not present

## 2022-07-17 DIAGNOSIS — N189 Chronic kidney disease, unspecified: Secondary | ICD-10-CM | POA: Diagnosis not present

## 2022-07-17 DIAGNOSIS — N184 Chronic kidney disease, stage 4 (severe): Secondary | ICD-10-CM | POA: Diagnosis not present

## 2022-07-17 DIAGNOSIS — R809 Proteinuria, unspecified: Secondary | ICD-10-CM | POA: Diagnosis not present

## 2022-07-24 DIAGNOSIS — M79674 Pain in right toe(s): Secondary | ICD-10-CM | POA: Diagnosis not present

## 2022-07-24 DIAGNOSIS — M79675 Pain in left toe(s): Secondary | ICD-10-CM | POA: Diagnosis not present

## 2022-07-24 DIAGNOSIS — I129 Hypertensive chronic kidney disease with stage 1 through stage 4 chronic kidney disease, or unspecified chronic kidney disease: Secondary | ICD-10-CM | POA: Diagnosis not present

## 2022-07-24 DIAGNOSIS — M79672 Pain in left foot: Secondary | ICD-10-CM | POA: Diagnosis not present

## 2022-07-24 DIAGNOSIS — N189 Chronic kidney disease, unspecified: Secondary | ICD-10-CM | POA: Diagnosis not present

## 2022-07-24 DIAGNOSIS — N184 Chronic kidney disease, stage 4 (severe): Secondary | ICD-10-CM | POA: Diagnosis not present

## 2022-07-24 DIAGNOSIS — R809 Proteinuria, unspecified: Secondary | ICD-10-CM | POA: Diagnosis not present

## 2022-07-24 DIAGNOSIS — S93332D Other subluxation of left foot, subsequent encounter: Secondary | ICD-10-CM | POA: Diagnosis not present

## 2022-07-24 DIAGNOSIS — D631 Anemia in chronic kidney disease: Secondary | ICD-10-CM | POA: Diagnosis not present

## 2022-07-24 DIAGNOSIS — I5042 Chronic combined systolic (congestive) and diastolic (congestive) heart failure: Secondary | ICD-10-CM | POA: Diagnosis not present

## 2022-07-24 DIAGNOSIS — L110 Acquired keratosis follicularis: Secondary | ICD-10-CM | POA: Diagnosis not present

## 2022-07-24 DIAGNOSIS — N19 Unspecified kidney failure: Secondary | ICD-10-CM | POA: Diagnosis not present

## 2022-07-24 DIAGNOSIS — I739 Peripheral vascular disease, unspecified: Secondary | ICD-10-CM | POA: Diagnosis not present

## 2022-07-24 DIAGNOSIS — S93331D Other subluxation of right foot, subsequent encounter: Secondary | ICD-10-CM | POA: Diagnosis not present

## 2022-07-24 DIAGNOSIS — M79671 Pain in right foot: Secondary | ICD-10-CM | POA: Diagnosis not present

## 2022-07-24 DIAGNOSIS — E211 Secondary hyperparathyroidism, not elsewhere classified: Secondary | ICD-10-CM | POA: Diagnosis not present

## 2022-07-31 DIAGNOSIS — Z0000 Encounter for general adult medical examination without abnormal findings: Secondary | ICD-10-CM | POA: Diagnosis not present

## 2022-08-09 DIAGNOSIS — N184 Chronic kidney disease, stage 4 (severe): Secondary | ICD-10-CM | POA: Diagnosis not present

## 2022-08-09 DIAGNOSIS — D631 Anemia in chronic kidney disease: Secondary | ICD-10-CM | POA: Diagnosis not present

## 2022-08-09 DIAGNOSIS — M109 Gout, unspecified: Secondary | ICD-10-CM | POA: Diagnosis not present

## 2022-08-09 DIAGNOSIS — I1310 Hypertensive heart and chronic kidney disease without heart failure, with stage 1 through stage 4 chronic kidney disease, or unspecified chronic kidney disease: Secondary | ICD-10-CM | POA: Diagnosis not present

## 2022-08-09 DIAGNOSIS — Z23 Encounter for immunization: Secondary | ICD-10-CM | POA: Diagnosis not present

## 2022-08-09 DIAGNOSIS — G4733 Obstructive sleep apnea (adult) (pediatric): Secondary | ICD-10-CM | POA: Diagnosis not present

## 2022-08-09 DIAGNOSIS — E039 Hypothyroidism, unspecified: Secondary | ICD-10-CM | POA: Diagnosis not present

## 2022-08-09 DIAGNOSIS — M5137 Other intervertebral disc degeneration, lumbosacral region: Secondary | ICD-10-CM | POA: Diagnosis not present

## 2022-08-09 DIAGNOSIS — I5042 Chronic combined systolic (congestive) and diastolic (congestive) heart failure: Secondary | ICD-10-CM | POA: Diagnosis not present

## 2022-08-09 DIAGNOSIS — L209 Atopic dermatitis, unspecified: Secondary | ICD-10-CM | POA: Diagnosis not present

## 2022-08-09 DIAGNOSIS — E782 Mixed hyperlipidemia: Secondary | ICD-10-CM | POA: Diagnosis not present

## 2022-08-09 DIAGNOSIS — Z0001 Encounter for general adult medical examination with abnormal findings: Secondary | ICD-10-CM | POA: Diagnosis not present

## 2022-08-20 ENCOUNTER — Encounter: Payer: Self-pay | Admitting: Orthopedic Surgery

## 2022-08-20 ENCOUNTER — Other Ambulatory Visit (INDEPENDENT_AMBULATORY_CARE_PROVIDER_SITE_OTHER): Payer: Self-pay

## 2022-08-20 ENCOUNTER — Ambulatory Visit: Payer: Medicare Other | Admitting: Orthopedic Surgery

## 2022-08-20 ENCOUNTER — Other Ambulatory Visit (INDEPENDENT_AMBULATORY_CARE_PROVIDER_SITE_OTHER): Payer: Medicare Other

## 2022-08-20 VITALS — BP 140/64 | HR 53 | Ht 64.0 in | Wt 204.0 lb

## 2022-08-20 DIAGNOSIS — M25552 Pain in left hip: Secondary | ICD-10-CM

## 2022-08-20 DIAGNOSIS — M1612 Unilateral primary osteoarthritis, left hip: Secondary | ICD-10-CM | POA: Diagnosis not present

## 2022-08-20 DIAGNOSIS — M5442 Lumbago with sciatica, left side: Secondary | ICD-10-CM | POA: Diagnosis not present

## 2022-08-20 DIAGNOSIS — G8929 Other chronic pain: Secondary | ICD-10-CM

## 2022-08-20 MED ORDER — PREDNISONE 10 MG (21) PO TBPK: ORAL_TABLET | ORAL | 0 refills | Status: DC

## 2022-08-20 NOTE — Progress Notes (Signed)
New Patient Visit  Assessment: Kathleen Vance is a 72 y.o. female with the following: 1. Chronic left-sided low back pain with left-sided sciatica 2. Arthritis of left hip  Plan: [name] has pain in the left side of her lower back, radiating into the left buttock.  Occasionally, she will have some numbness and tingling sensations in the left lateral thigh.  She repeatedly denies any pain in the left groin, or left anterior hip.  We obtained radiographs of the left hip, as well as the lumbar spine.  She has arthritis in the left hip, with loss of joint space, and associated osteophytes.  On the standing lumbar spine x-rays, she has some mild degenerative scoliosis, with osteophytes and loss of joint space at L5-S1.  This is most likely contributing to her pain.  She states she is the primary caregiver for her husband, and it is difficult for her to get to physical therapy.  As such, I am recommending we try prednisone Dosepak.  She is in agreement with this plan.  If she continues to have issues, she will contact the clinic.  Follow-up: Return if symptoms worsen or fail to improve.  Subjective:  Chief Complaint  Patient presents with   Hip Pain    L side hip pain for approx 6 mos    History of Present Illness: Kathleen Vance is a 72 y.o. female who has been referred by Nita Sells, MD for evaluation of left hip pain.  She states that she been having pain in the left buttock area for the past 6 months.  She has had this before.  Occasionally, she will have some pain which radiates into the buttock, and the upper leg.  She denies pain in the groin, as well as the anterior thigh.  Pain gets worse if she stands for extended period of time.  She states that she can stand for only about 5 minutes before it starts to be painful.  She denies numbness and tingling into her feet.  Pain does not radiate beyond the knee.  She cannot take NSAIDs.  She only takes Tylenol at this point.  She has  taken steroids in the past, as part of treatment for gout, and she states this has been helpful for her back.   Review of Systems: No fevers or chills Occasional numbness or tingling No chest pain No shortness of breath No bowel or bladder dysfunction No GI distress No headaches   Medical History:  Past Medical History:  Diagnosis Date   Aortic stenosis    Arthritis    Celiac artery stenosis    Gout    History of kidney stones    Hyperlipidemia    Hypertension    Hypothyroidism    Kidney failure    Prediabetes    Renal artery stenosis    Shingles rash     Past Surgical History:  Procedure Laterality Date   ANKLE SURGERY Right    CATARACT EXTRACTION Bilateral    CESAREAN SECTION     CHOLECYSTECTOMY     DILATION AND CURETTAGE OF UTERUS     PARTIAL HYSTERECTOMY     REPAIR KNEE LIGAMENT Right    TUBAL LIGATION      Family History  Problem Relation Age of Onset   CAD Mother    Heart attack Mother    CAD Father    Pneumonia Sister    Other Brother        killed at age 29  CAD Maternal Grandmother    Heart attack Maternal Grandmother    Diabetes Maternal Grandfather    Other Paternal Grandmother        died after child birth   Heart attack Paternal Grandfather    Social History   Tobacco Use   Smoking status: Never   Smokeless tobacco: Never  Vaping Use   Vaping Use: Never used  Substance Use Topics   Alcohol use: Yes    Comment: rarely   Drug use: Never    Allergies  Allergen Reactions   Bactrim [Sulfamethoxazole-Trimethoprim]     Nausea, diarrhea   Ciprofloxacin Anaphylaxis   Nitrofurantoin Anaphylaxis   Metronidazole     "sick"   Amoxicillin Rash   Erythromycin Rash    Current Meds  Medication Sig   furosemide (LASIX) 20 MG tablet Take 3 tablets by mouth daily.   hydrALAZINE (APRESOLINE) 100 MG tablet Take 100 mg by mouth 3 (three) times daily.   HYDROcodone-acetaminophen (NORCO) 10-325 MG tablet Take 1 tablet by mouth 4 (four) times  daily.   isosorbide mononitrate (IMDUR) 30 MG 24 hr tablet Take 1 tablet by mouth daily.   levothyroxine (SYNTHROID) 137 MCG tablet Take 137 mcg by mouth daily before breakfast.   lisinopril (ZESTRIL) 2.5 MG tablet Take 2.5 mg by mouth daily.   predniSONE (STERAPRED UNI-PAK 21 TAB) 10 MG (21) TBPK tablet 10 mg DS 12 as directed   spironolactone (ALDACTONE) 25 MG tablet Take 12.5 mg by mouth daily.    Objective: BP (!) 140/64   Pulse (!) 53   Ht  (1.626 m)   Wt 204 lb (92.5 kg)   BMI 35.02 kg/m   Physical Exam:  General: Elderly female., Alert and oriented., and No acute distress. Gait: Left sided antalgic gait.  Walks with a cane.  Evaluation of the lower back demonstrates no deformity.  She has tenderness to palpation within the left buttock.  Negative straight leg raise bilaterally.  She has good lower body strength.  Limited internal rotation of the left hip.  No pain in her groin.  No pain in the anterior hip.  She tolerates range of motion of the right hip without pain.    IMAGING: I personally ordered and reviewed the following images   Standing lumbar spine x-rays were obtained in clinic today.  No acute injuries are noted.  Mild degenerative scoliosis appreciated on the coronal image.  She has some mild diffuse degenerative changes, with the exception of L5-S1, where there is almost complete loss of disc height, as well as anterior based osteophytes.  No bony lesions.  Impression: Lumbar spine x-rays with degenerative scoliosis, and advanced degenerative changes at L5-S1.   AP pelvis and left hip x-rays were obtained in clinic today.  No acute injuries are noted.  Loss of joint space, with associated lateral based osteophytes.  Limited views of the right hip were also obtained, which demonstrates some mild degenerative changes, including some loss of joint space, and lateral osteophytes.  Impression: Left hip x-rays with moderate to severe degenerative  changes.   New Medications:  Meds ordered this encounter  Medications   predniSONE (STERAPRED UNI-PAK 21 TAB) 10 MG (21) TBPK tablet    Sig: 10 mg DS 12 as directed    Dispense:  48 tablet    Refill:  0      Oliver Barre, MD  08/20/2022 2:12 PM

## 2022-10-02 DIAGNOSIS — M79672 Pain in left foot: Secondary | ICD-10-CM | POA: Diagnosis not present

## 2022-10-02 DIAGNOSIS — M79674 Pain in right toe(s): Secondary | ICD-10-CM | POA: Diagnosis not present

## 2022-10-02 DIAGNOSIS — S93331D Other subluxation of right foot, subsequent encounter: Secondary | ICD-10-CM | POA: Diagnosis not present

## 2022-10-02 DIAGNOSIS — M79675 Pain in left toe(s): Secondary | ICD-10-CM | POA: Diagnosis not present

## 2022-10-02 DIAGNOSIS — S93332D Other subluxation of left foot, subsequent encounter: Secondary | ICD-10-CM | POA: Diagnosis not present

## 2022-10-02 DIAGNOSIS — M79671 Pain in right foot: Secondary | ICD-10-CM | POA: Diagnosis not present

## 2022-10-02 DIAGNOSIS — I739 Peripheral vascular disease, unspecified: Secondary | ICD-10-CM | POA: Diagnosis not present

## 2022-10-02 DIAGNOSIS — L110 Acquired keratosis follicularis: Secondary | ICD-10-CM | POA: Diagnosis not present

## 2022-10-09 DIAGNOSIS — N2581 Secondary hyperparathyroidism of renal origin: Secondary | ICD-10-CM | POA: Diagnosis not present

## 2022-10-09 DIAGNOSIS — N184 Chronic kidney disease, stage 4 (severe): Secondary | ICD-10-CM | POA: Diagnosis not present

## 2022-10-09 DIAGNOSIS — I5032 Chronic diastolic (congestive) heart failure: Secondary | ICD-10-CM | POA: Diagnosis not present

## 2022-10-09 DIAGNOSIS — R809 Proteinuria, unspecified: Secondary | ICD-10-CM | POA: Diagnosis not present

## 2022-10-09 DIAGNOSIS — I129 Hypertensive chronic kidney disease with stage 1 through stage 4 chronic kidney disease, or unspecified chronic kidney disease: Secondary | ICD-10-CM | POA: Diagnosis not present

## 2022-10-13 DIAGNOSIS — N184 Chronic kidney disease, stage 4 (severe): Secondary | ICD-10-CM | POA: Diagnosis not present

## 2022-10-13 DIAGNOSIS — R809 Proteinuria, unspecified: Secondary | ICD-10-CM | POA: Diagnosis not present

## 2022-10-13 DIAGNOSIS — I5042 Chronic combined systolic (congestive) and diastolic (congestive) heart failure: Secondary | ICD-10-CM | POA: Diagnosis not present

## 2022-10-13 DIAGNOSIS — D638 Anemia in other chronic diseases classified elsewhere: Secondary | ICD-10-CM | POA: Diagnosis not present

## 2022-12-04 ENCOUNTER — Telehealth: Payer: Self-pay | Admitting: Orthopedic Surgery

## 2022-12-04 ENCOUNTER — Other Ambulatory Visit: Payer: Self-pay | Admitting: Orthopedic Surgery

## 2022-12-04 MED ORDER — PREDNISONE 10 MG (21) PO TBPK: ORAL_TABLET | ORAL | 0 refills | Status: DC

## 2022-12-04 NOTE — Telephone Encounter (Signed)
Dr. Dallas Schimke Kathleen Vance - Kathleen Vance lvm requesting another script for Prednisone for her hip to be sent to [facility].

## 2022-12-04 NOTE — Progress Notes (Signed)
Requested Prescriptions   Signed Prescriptions Disp Refills   predniSONE (STERAPRED UNI-PAK 21 TAB) 10 MG (21) TBPK tablet 48 tablet 0    Sig: 10 mg DS 12 as directed

## 2022-12-13 ENCOUNTER — Other Ambulatory Visit: Payer: Self-pay

## 2022-12-13 ENCOUNTER — Encounter (HOSPITAL_COMMUNITY): Payer: Self-pay | Admitting: Emergency Medicine

## 2022-12-13 ENCOUNTER — Emergency Department (HOSPITAL_COMMUNITY): Payer: Medicare Other

## 2022-12-13 ENCOUNTER — Inpatient Hospital Stay (HOSPITAL_COMMUNITY)
Admission: EM | Admit: 2022-12-13 | Discharge: 2022-12-15 | DRG: 309 | Disposition: A | Discharge status: Home / Self Care | Payer: Medicare Other | Attending: Family Medicine | Admitting: Family Medicine

## 2022-12-13 DIAGNOSIS — Z90711 Acquired absence of uterus with remaining cervical stump: Secondary | ICD-10-CM

## 2022-12-13 DIAGNOSIS — I5042 Chronic combined systolic (congestive) and diastolic (congestive) heart failure: Secondary | ICD-10-CM | POA: Diagnosis not present

## 2022-12-13 DIAGNOSIS — E039 Hypothyroidism, unspecified: Secondary | ICD-10-CM | POA: Diagnosis not present

## 2022-12-13 DIAGNOSIS — Z881 Allergy status to other antibiotic agents status: Secondary | ICD-10-CM

## 2022-12-13 DIAGNOSIS — R0989 Other specified symptoms and signs involving the circulatory and respiratory systems: Secondary | ICD-10-CM | POA: Diagnosis not present

## 2022-12-13 DIAGNOSIS — N179 Acute kidney failure, unspecified: Secondary | ICD-10-CM | POA: Diagnosis not present

## 2022-12-13 DIAGNOSIS — Z6838 Body mass index (BMI) 38.0-38.9, adult: Secondary | ICD-10-CM

## 2022-12-13 DIAGNOSIS — I130 Hypertensive heart and chronic kidney disease with heart failure and stage 1 through stage 4 chronic kidney disease, or unspecified chronic kidney disease: Secondary | ICD-10-CM | POA: Diagnosis not present

## 2022-12-13 DIAGNOSIS — I10 Essential (primary) hypertension: Secondary | ICD-10-CM | POA: Diagnosis present

## 2022-12-13 DIAGNOSIS — Z882 Allergy status to sulfonamides status: Secondary | ICD-10-CM

## 2022-12-13 DIAGNOSIS — I4891 Unspecified atrial fibrillation: Principal | ICD-10-CM | POA: Diagnosis present

## 2022-12-13 DIAGNOSIS — R0789 Other chest pain: Secondary | ICD-10-CM | POA: Diagnosis present

## 2022-12-13 DIAGNOSIS — N184 Chronic kidney disease, stage 4 (severe): Secondary | ICD-10-CM | POA: Diagnosis not present

## 2022-12-13 DIAGNOSIS — Z888 Allergy status to other drugs, medicaments and biological substances status: Secondary | ICD-10-CM

## 2022-12-13 DIAGNOSIS — D631 Anemia in chronic kidney disease: Secondary | ICD-10-CM | POA: Diagnosis not present

## 2022-12-13 DIAGNOSIS — Z8249 Family history of ischemic heart disease and other diseases of the circulatory system: Secondary | ICD-10-CM

## 2022-12-13 DIAGNOSIS — C4400 Unspecified malignant neoplasm of skin of lip: Secondary | ICD-10-CM | POA: Diagnosis not present

## 2022-12-13 DIAGNOSIS — Z85828 Personal history of other malignant neoplasm of skin: Secondary | ICD-10-CM | POA: Diagnosis not present

## 2022-12-13 DIAGNOSIS — Z9841 Cataract extraction status, right eye: Secondary | ICD-10-CM

## 2022-12-13 DIAGNOSIS — Z79890 Hormone replacement therapy (postmenopausal): Secondary | ICD-10-CM

## 2022-12-13 DIAGNOSIS — Z9049 Acquired absence of other specified parts of digestive tract: Secondary | ICD-10-CM

## 2022-12-13 DIAGNOSIS — R7303 Prediabetes: Secondary | ICD-10-CM | POA: Diagnosis present

## 2022-12-13 DIAGNOSIS — E6609 Other obesity due to excess calories: Secondary | ICD-10-CM | POA: Diagnosis present

## 2022-12-13 DIAGNOSIS — Z79899 Other long term (current) drug therapy: Secondary | ICD-10-CM

## 2022-12-13 DIAGNOSIS — Z91199 Patient's noncompliance with other medical treatment and regimen due to unspecified reason: Secondary | ICD-10-CM | POA: Diagnosis not present

## 2022-12-13 DIAGNOSIS — Z9842 Cataract extraction status, left eye: Secondary | ICD-10-CM

## 2022-12-13 DIAGNOSIS — Z87442 Personal history of urinary calculi: Secondary | ICD-10-CM

## 2022-12-13 DIAGNOSIS — F32A Depression, unspecified: Secondary | ICD-10-CM | POA: Diagnosis present

## 2022-12-13 DIAGNOSIS — Z833 Family history of diabetes mellitus: Secondary | ICD-10-CM

## 2022-12-13 DIAGNOSIS — R079 Chest pain, unspecified: Secondary | ICD-10-CM | POA: Diagnosis not present

## 2022-12-13 DIAGNOSIS — I4892 Unspecified atrial flutter: Secondary | ICD-10-CM | POA: Diagnosis not present

## 2022-12-13 DIAGNOSIS — I509 Heart failure, unspecified: Secondary | ICD-10-CM

## 2022-12-13 DIAGNOSIS — K219 Gastro-esophageal reflux disease without esophagitis: Secondary | ICD-10-CM | POA: Diagnosis not present

## 2022-12-13 DIAGNOSIS — R9431 Abnormal electrocardiogram [ECG] [EKG]: Secondary | ICD-10-CM | POA: Diagnosis not present

## 2022-12-13 DIAGNOSIS — E785 Hyperlipidemia, unspecified: Secondary | ICD-10-CM | POA: Diagnosis present

## 2022-12-13 DIAGNOSIS — I517 Cardiomegaly: Secondary | ICD-10-CM | POA: Diagnosis not present

## 2022-12-13 DIAGNOSIS — Z91148 Patient's other noncompliance with medication regimen for other reason: Secondary | ICD-10-CM

## 2022-12-13 LAB — PROTIME-INR
INR: 1.1 (ref 0.8–1.2)
Prothrombin Time: 14 seconds (ref 11.4–15.2)

## 2022-12-13 LAB — TROPONIN I (HIGH SENSITIVITY)
Troponin I (High Sensitivity): 19 ng/L — ABNORMAL HIGH (ref <18)
Troponin I (High Sensitivity): 20 ng/L — ABNORMAL HIGH (ref <18)

## 2022-12-13 LAB — CBC
HCT: 36.3 % (ref 36.0–46.0)
Hemoglobin: 11.4 g/dL — ABNORMAL LOW (ref 12.0–15.0)
MCH: 30.7 pg (ref 26.0–34.0)
MCHC: 31.4 g/dL (ref 30.0–36.0)
MCV: 97.8 fL (ref 80.0–100.0)
Platelets: 253 10*3/uL (ref 150–400)
RBC: 3.71 MIL/uL — ABNORMAL LOW (ref 3.87–5.11)
RDW: 15 % (ref 11.5–15.5)
WBC: 11.6 10*3/uL — ABNORMAL HIGH (ref 4.0–10.5)
nRBC: 0 % (ref 0.0–0.2)

## 2022-12-13 LAB — BASIC METABOLIC PANEL
Anion gap: 10 (ref 5–15)
BUN: 41 mg/dL — ABNORMAL HIGH (ref 8–23)
CO2: 23 mmol/L (ref 22–32)
Calcium: 8.4 mg/dL — ABNORMAL LOW (ref 8.9–10.3)
Chloride: 105 mmol/L (ref 98–111)
Creatinine, Ser: 2.1 mg/dL — ABNORMAL HIGH (ref 0.44–1.00)
GFR, Estimated: 24 mL/min — ABNORMAL LOW (ref >60)
Glucose, Bld: 114 mg/dL — ABNORMAL HIGH (ref 70–99)
Potassium: 3.8 mmol/L (ref 3.5–5.1)
Sodium: 138 mmol/L (ref 135–145)

## 2022-12-13 LAB — BRAIN NATRIURETIC PEPTIDE: B Natriuretic Peptide: 217 pg/mL — ABNORMAL HIGH (ref 0.0–100.0)

## 2022-12-13 LAB — MAGNESIUM: Magnesium: 2.1 mg/dL (ref 1.7–2.4)

## 2022-12-13 LAB — TSH: TSH: 1.078 u[IU]/mL (ref 0.350–4.500)

## 2022-12-13 MED ORDER — ONDANSETRON HCL 4 MG PO TABS: 4.0000 mg | ORAL_TABLET | Freq: Four times a day (QID) | ORAL | Status: DC | PRN

## 2022-12-13 MED ORDER — TRAZODONE HCL 50 MG PO TABS: 50.0000 mg | ORAL_TABLET | Freq: Every evening | ORAL | Status: DC | PRN

## 2022-12-13 MED ORDER — ALUM & MAG HYDROXIDE-SIMETH 200-200-20 MG/5ML PO SUSP: 15.0000 mL | ORAL | Status: DC | PRN

## 2022-12-13 MED ORDER — ASPIRIN 81 MG PO TBEC
81.0000 mg | DELAYED_RELEASE_TABLET | Freq: Every day | ORAL | Status: DC
  Administered 2022-12-14 – 2022-12-15 (×2): 81 mg via ORAL
  Filled 2022-12-13 (×2): qty 1

## 2022-12-13 MED ORDER — METOPROLOL TARTRATE 5 MG/5ML IV SOLN
5.0000 mg | Freq: Once | INTRAVENOUS | Status: AC
  Administered 2022-12-13: 5 mg via INTRAVENOUS
  Filled 2022-12-13: qty 5

## 2022-12-13 MED ORDER — TRAZODONE HCL 50 MG PO TABS
50.0000 mg | ORAL_TABLET | Freq: Every day | ORAL | Status: DC
  Administered 2022-12-13 – 2022-12-14 (×2): 50 mg via ORAL
  Filled 2022-12-13 (×2): qty 1

## 2022-12-13 MED ORDER — HYDRALAZINE HCL 20 MG/ML IJ SOLN
10.0000 mg | Freq: Once | INTRAMUSCULAR | Status: DC
Start: 2022-12-13 — End: 2022-12-13

## 2022-12-13 MED ORDER — SODIUM CHLORIDE 0.9% FLUSH
3.0000 mL | Freq: Two times a day (BID) | INTRAVENOUS | Status: DC
  Administered 2022-12-13 – 2022-12-15 (×3): 3 mL via INTRAVENOUS

## 2022-12-13 MED ORDER — ACETAMINOPHEN 650 MG RE SUPP: 650.0000 mg | Freq: Four times a day (QID) | RECTAL | Status: DC | PRN

## 2022-12-13 MED ORDER — ISOSORBIDE MONONITRATE ER 60 MG PO TB24
30.0000 mg | ORAL_TABLET | Freq: Every day | ORAL | Status: DC
  Administered 2022-12-13 – 2022-12-15 (×3): 30 mg via ORAL
  Filled 2022-12-13 (×3): qty 1

## 2022-12-13 MED ORDER — BISACODYL 10 MG RE SUPP: 10.0000 mg | Freq: Every day | RECTAL | Status: DC | PRN

## 2022-12-13 MED ORDER — LABETALOL HCL 5 MG/ML IV SOLN
10.0000 mg | Freq: Once | INTRAVENOUS | Status: AC
  Administered 2022-12-13: 10 mg via INTRAVENOUS
  Filled 2022-12-13: qty 4

## 2022-12-13 MED ORDER — HYDRALAZINE HCL 25 MG PO TABS
100.0000 mg | ORAL_TABLET | Freq: Three times a day (TID) | ORAL | Status: DC
  Administered 2022-12-13 – 2022-12-14 (×4): 100 mg via ORAL
  Filled 2022-12-13 (×6): qty 4

## 2022-12-13 MED ORDER — ONDANSETRON HCL 4 MG/2ML IJ SOLN
4.0000 mg | Freq: Four times a day (QID) | INTRAMUSCULAR | Status: DC | PRN
  Administered 2022-12-13: 4 mg via INTRAVENOUS
  Filled 2022-12-13: qty 2

## 2022-12-13 MED ORDER — LEVOTHYROXINE SODIUM 137 MCG PO TABS
137.0000 ug | ORAL_TABLET | Freq: Every day | ORAL | Status: DC
  Administered 2022-12-14 – 2022-12-15 (×2): 137 ug via ORAL
  Filled 2022-12-13 (×2): qty 1

## 2022-12-13 MED ORDER — ESCITALOPRAM OXALATE 10 MG PO TABS
10.0000 mg | ORAL_TABLET | Freq: Every day | ORAL | Status: DC
  Administered 2022-12-13 – 2022-12-15 (×3): 10 mg via ORAL
  Filled 2022-12-13 (×3): qty 1

## 2022-12-13 MED ORDER — HYDROCODONE-ACETAMINOPHEN 10-325 MG PO TABS
1.0000 | ORAL_TABLET | Freq: Three times a day (TID) | ORAL | Status: DC | PRN
  Administered 2022-12-13 – 2022-12-15 (×4): 1 via ORAL
  Filled 2022-12-13 (×4): qty 1

## 2022-12-13 MED ORDER — FUROSEMIDE 10 MG/ML IJ SOLN
40.0000 mg | Freq: Every day | INTRAMUSCULAR | Status: DC
  Administered 2022-12-13 – 2022-12-14 (×2): 40 mg via INTRAVENOUS
  Filled 2022-12-13 (×3): qty 4

## 2022-12-13 MED ORDER — METOPROLOL TARTRATE 25 MG PO TABS
25.0000 mg | ORAL_TABLET | Freq: Four times a day (QID) | ORAL | Status: DC
  Administered 2022-12-13 – 2022-12-14 (×4): 25 mg via ORAL
  Filled 2022-12-13 (×4): qty 1

## 2022-12-13 MED ORDER — HEPARIN SODIUM (PORCINE) 5000 UNIT/ML IJ SOLN
5000.0000 [IU] | Freq: Three times a day (TID) | INTRAMUSCULAR | Status: DC
  Administered 2022-12-13 – 2022-12-15 (×4): 5000 [IU] via SUBCUTANEOUS
  Filled 2022-12-13 (×5): qty 1

## 2022-12-13 MED ORDER — POLYETHYLENE GLYCOL 3350 17 G PO PACK: 17.0000 g | PACK | Freq: Every day | ORAL | Status: DC | PRN

## 2022-12-13 MED ORDER — SODIUM CHLORIDE 0.9 % IV SOLN: INTRAVENOUS | Status: DC | PRN

## 2022-12-13 MED ORDER — SODIUM CHLORIDE 0.9% FLUSH: 3.0000 mL | INTRAVENOUS | Status: DC | PRN

## 2022-12-13 MED ORDER — ACETAMINOPHEN 325 MG PO TABS: 650.0000 mg | ORAL_TABLET | Freq: Four times a day (QID) | ORAL | Status: DC | PRN

## 2022-12-13 MED ORDER — SODIUM CHLORIDE 0.9% FLUSH
3.0000 mL | Freq: Two times a day (BID) | INTRAVENOUS | Status: DC
  Administered 2022-12-13 – 2022-12-15 (×5): 3 mL via INTRAVENOUS

## 2022-12-13 NOTE — ED Triage Notes (Signed)
Pt complains of heart palpations and SOB x 1 week. Chest pain on left side radiates to left arm. States pain feels like a pressure.

## 2022-12-13 NOTE — ED Triage Notes (Signed)
Pt hasn't taken BP meds completely for the past week because she hasn't felt like swallowing pills.

## 2022-12-13 NOTE — H&P (Signed)
Patient Demographics:    Kathleen Vance, is a 72 y.o. female  MRN: [id number]   DOB - [birth year]  Admit Date - 12/13/2022  Outpatient Primary MD for the patient is Benita Stabile, MD   Assessment & Plan:   Assessment and Plan: 1) new onset atrial fibrillation/atrial flutter=--- prior echo from 2021 with EF of 45 to 50% -Treat empirically with p.o. metoprolol for rate control -Repeat echo--and if EF is fine may switch to Cardizem for rate control -TSH 1.0 -Hold off on anticoagulation as patient is not likely to be compliant unfortunately -EDP discussed with on-call cardiologist Dr. Jenene Slicker-  2) uncontrolled hypertension--- started blood pressure initially over 200 mmhg by EMS -BP in the ED 185/122 -after Interventions BP down to 165/114 -P.o. metoprolol as ordered -IV labetalol as needed -Restart PTA Imdur/hydralazine combo which patient was not compliant with  3) chest discomfort--- most likely due to #1 #2 above Troponin 19 repeat troponin 20----twelve-lead EKG shows minimal voltage criteria for LVH, may be normal variant ( Cornell product ) Nonspecific ST and T wave abnormality -Aspirin and metoprolol as ordered -Echo pending as above #1  4)HFpEF--history of chronic diastolic dysfunction CHF-- prior echo from 2021 with EF of 45 to 50%, with grade 3 diastolic dysfunction -IV Lasix as ordered -Daily weight -Fluid input and output monitoring  5) depression--- caregiver burnout -Daughter-in-law Germaine Pomfret at bedside, patient admits to feeling overwhelmed as she is a primary caregiver for her 73 year old man who has Parkinson's... And her sister who has developmental delay... -Denies homicidal or suicidal ideation or plan -Agreeable to take Lexapro and trazodone  6)CKD stage -  iV - -Creatinine 2.10  which is not far from prior baseline -Monitor closely especially while given IV Lasix -- renally adjust medications, avoid nephrotoxic agents / dehydration  / hypotension  7)right-sided facial basal cell carcinoma--outpatient follow-up with dermatology for Mohs surgery as previously advised  8) chronic anemia---Hemoglobin is 9.4 -Suspect some component of anemia of CKD -No bleeding concerns, monitor closely  9) history of medication and lifestyle noncompliance--mostly due to #5 above  10) hypothyroidism--- TSH 1.0, continue levothyroxine  Dispo: The patient is from: Home              Anticipated d/c is to: Home              Anticipated d/c date is: 1 day              Patient currently is not medically stable to d/c. Barriers: Not Clinically Stable-    With History of - Reviewed by me  Past Medical History:  Diagnosis Date   Aortic stenosis    Arthritis    Celiac artery stenosis (HCC)    Gout    History of kidney stones    Hyperlipidemia    Hypertension    Hypothyroidism    Kidney failure    Prediabetes    Renal artery stenosis (HCC)  Shingles rash       Past Surgical History:  Procedure Laterality Date   ANKLE SURGERY Right    CATARACT EXTRACTION Bilateral    CESAREAN SECTION     CHOLECYSTECTOMY     DILATION AND CURETTAGE OF UTERUS     PARTIAL HYSTERECTOMY     REPAIR KNEE LIGAMENT Right    TUBAL LIGATION      Chief Complaint  Patient presents with   Palpitations      HPI:    Kathleen Vance  is a 72 y.o. female with past medical history relevant for obesity, CKD IV, hypothyroidism, hypertension, HLD, and history of glucose intolerance who presents to the ED with elevated BP, shortness of some component of many of CKD palpitations and chest discomfort in the setting of noncompliance with medications prior to admission -Daughter-in-law at bedside patient admits to feeling overwhelmed as she is a primary caregiver for her 73 year old man who has  Parkinson's... And her sister who has developmental delay... She has not followed up for her right-sided facial basal cell carcinoma -Admits to depressive symptoms and sleep disorder, denies suicidal or homicidal ideation or plan No fever  Or chills  -Additional history obtained from daughter-in-law Germaine Pomfret  No Nausea, Vomiting or Diarrhea -BNP 217 -TSH 1.0 -Troponin 19 repeat troponin 20----twelve-lead EKG shows minimal voltage criteria for LVH, may be normal variant ( Cornell product ) Nonspecific ST and T wave abnormality Abnormal ECG When compared with ECG of 18-Aug-2018 11:00, aflutter is new -INR is 1.1 -Creatinine 2.10 which is not far from prior baseline -Hemoglobin is 9.4 -BP discussed with on-call cardiologist Dr. Jenene Slicker- -chest x-ray with cardiomegaly otherwise no acute findings    Review of systems:    In addition to the HPI above,   A full Review of  Systems was done, all other systems reviewed are negative except as noted above in HPI , .    Social History:  Reviewed by me    Social History   Tobacco Use   Smoking status: Never   Smokeless tobacco: Never  Substance Use Topics   Alcohol use: Yes    Comment: rarely     Family History :  Reviewed by me    Family History  Problem Relation Age of Onset   CAD Mother    Heart attack Mother    CAD Father    Pneumonia Sister    Other Brother        killed at age 46   CAD Maternal Grandmother    Heart attack Maternal Grandmother    Diabetes Maternal Grandfather    Other Paternal Grandmother        died after child birth   Heart attack Paternal Grandfather     Home Medications:   Prior to Admission medications   Medication Sig Start Date End Date Taking? Authorizing Provider  furosemide (LASIX) 20 MG tablet Take 3 tablets by mouth daily.    [provider]  hydrALAZINE (APRESOLINE) 100 MG tablet Take 100 mg by mouth 3 (three) times daily. 01/23/22   [provider]   HYDROcodone-acetaminophen (NORCO) 10-325 MG tablet Take 1 tablet by mouth 4 (four) times daily. 11/27/21   [provider]  isosorbide mononitrate (IMDUR) 30 MG 24 hr tablet Take 1 tablet by mouth daily. 12/04/21   [provider]  levothyroxine (SYNTHROID) 137 MCG tablet Take 137 mcg by mouth daily before breakfast. 11/27/21   [provider]  lisinopril (ZESTRIL) 2.5 MG tablet Take  2.5 mg by mouth daily. 06/19/22   [provider]  predniSONE (STERAPRED UNI-PAK 21 TAB) 10 MG (21) TBPK tablet 10 mg DS 12 as directed 12/04/22   Vickki Hearing, MD  spironolactone (ALDACTONE) 25 MG tablet Take 12.5 mg by mouth daily.    [provider]     Allergies:     Allergies  Allergen Reactions   Bactrim [Sulfamethoxazole-Trimethoprim]     Nausea, diarrhea   Ciprofloxacin Anaphylaxis   Nitrofurantoin Anaphylaxis   Metronidazole     "sick"   Amoxicillin Rash   Erythromycin Rash     Physical Exam:   Vitals  Blood pressure (!) 185/129, pulse 95, temperature 99.7 F (37.6 C), temperature source Oral, resp. rate 14, height 5\' 4"  (1.626 m), weight 92 kg, SpO2 95%.  Physical Examination: General appearance - alert,  in no distress  Mental status - alert, oriented to person, place, and time,  Eyes - sclera anicteric Neck - supple, no JVD elevation , Chest - clear  to auscultation bilaterally, symmetrical air movement,  Heart - S1 and S2 normal, irregularly irregular  abdomen - soft, nontender, nondistended, +BS Neurological - screening mental status exam normal, neck supple without rigidity, cranial nerves II through XII intact, DTR's normal and symmetric Extremities - trace pedal edema noted, intact peripheral pulses  Skin - warm, dry     Data Review:    CBC Recent Labs  Lab 12/13/22 1221  WBC 11.6*  HGB 11.4*  HCT 36.3  PLT 253  MCV 97.8  MCH 30.7  MCHC 31.4  RDW 15.0    ------------------------------------------------------------------------------------------------------------------  Chemistries  Recent Labs  Lab 12/13/22 1221 12/13/22 1234  NA 138  --   K 3.8  --   CL 105  --   CO2 23  --   GLUCOSE 114*  --   BUN 41*  --   CREATININE 2.10*  --   CALCIUM 8.4*  --   MG  --  2.1   ------------------------------------------------------------------------------------------------------------------ estimated creatinine clearance is 26.2 mL/min (A) (by C-G formula based on SCr of 2.1 mg/dL (H)). ------------------------------------------------------------------------------------------------------------------ Recent Labs    12/13/22 1221  TSH 1.078     Coagulation profile Recent Labs  Lab 12/13/22 1245  INR 1.1   ------------------------------------------------------------------------------------------------------------------------------------------------------------------------------------------------------------------------------------    Component Value Date/Time   BNP 217.0 (H) 12/13/2022 1243   Urinalysis    Component Value Date/Time   COLORURINE YELLOW 08/18/2018 1511   APPEARANCEUR HAZY (A) 08/18/2018 1511   LABSPEC 1.020 08/18/2018 1511   PHURINE 5.0 08/18/2018 1511   GLUCOSEU NEGATIVE 08/18/2018 1511   HGBUR NEGATIVE 08/18/2018 1511   BILIRUBINUR NEGATIVE 08/18/2018 1511   KETONESUR NEGATIVE 08/18/2018 1511   PROTEINUR 100 (A) 08/18/2018 1511   NITRITE NEGATIVE 08/18/2018 1511   LEUKOCYTESUR TRACE (A) 08/18/2018 1511    ----------------------------------------------------------------------------------------------------------------   Imaging Results:    DG Chest 2 View  Result Date: 12/13/2022 CLINICAL DATA:  Chest pain EXAM: CHEST - 2 VIEW COMPARISON:  None Available. FINDINGS: Enlarged cardiac silhouette. Prominent RIGHT pulmonary artery similar to prior. Low lung volumes. No effusion, infiltrate or pneumothorax.  Degenerative osteophytosis of the spine. IMPRESSION: 1. No acute cardiopulmonary process. 2. Cardiomegaly. Electronically Signed   By: Genevive Bi M.D.   On: 12/13/2022 12:34    Radiological Exams on Admission: DG Chest 2 View  Result Date: 12/13/2022 CLINICAL DATA:  Chest pain EXAM: CHEST - 2 VIEW COMPARISON:  None Available. FINDINGS: Enlarged cardiac silhouette. Prominent RIGHT pulmonary artery similar to  prior. Low lung volumes. No effusion, infiltrate or pneumothorax. Degenerative osteophytosis of the spine. IMPRESSION: 1. No acute cardiopulmonary process. 2. Cardiomegaly. Electronically Signed   By: Genevive Bi M.D.   On: 12/13/2022 12:34    DVT Prophylaxis -SCD /heparin AM Labs Ordered, also please review Full Orders  Family Communication: Admission, patients condition and plan of care including tests being ordered have been discussed with the patient and daughter-in-law Germaine Pomfret who indicate understanding and agree with the plan   Condition   -stable  Shon Hale M.D on 12/13/2022 at 2:56 PM Go to [email or web address] -  for contact info  [facility] - Office  [phone]

## 2022-12-13 NOTE — ED Provider Notes (Signed)
[place] EMERGENCY DEPARTMENT AT [facility] Provider Note   CSN: [id number] Arrival date & time: 12/13/22  1124     History  Chief Complaint  Patient presents with   Palpitations    Kathleen Vance is a 72 y.o. female.  She has a history of hypertension hyperlipidemia CKD.  She used to see cardiology here Dr. Purvis Sheffield for shortness of breath and chest pain.  She tells me they wanted to do a heart catheterization.  She has not followed up with cards since 2021.  She said her heart has been beating fast and she has had some tightness in her chest into her left shoulder for at least a few days if not a week.  She feels short of breath.  Has not taken her blood pressure medicine for at least a week.  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  The history is provided by the patient.  Palpitations Palpitations quality:  Fast Onset quality:  Gradual Timing:  Constant Progression:  Unchanged Chronicity:  New Relieved by:  Nothing Worsened by:  Nothing Ineffective treatments:  None tried Associated symptoms: chest pain, malaise/fatigue and shortness of breath   Associated symptoms: no cough and no diaphoresis   Risk factors: heart disease        Home Medications Prior to Admission medications   Medication Sig Start Date End Date Taking? Authorizing Provider  furosemide (LASIX) 20 MG tablet Take 3 tablets by mouth daily.    [provider]  hydrALAZINE (APRESOLINE) 100 MG tablet Take 100 mg by mouth 3 (three) times daily. 01/23/22   [provider]  HYDROcodone-acetaminophen (NORCO) 10-325 MG tablet Take 1 tablet by mouth 4 (four) times daily. 11/27/21   [provider]  isosorbide mononitrate (IMDUR) 30 MG 24 hr tablet Take 1 tablet by mouth daily. 12/04/21   [provider]  levothyroxine (SYNTHROID) 137 MCG tablet Take  137 mcg by mouth daily before breakfast. 11/27/21   [provider]  lisinopril (ZESTRIL) 2.5 MG tablet Take 2.5 mg by mouth daily. 06/19/22   [provider]  predniSONE (STERAPRED UNI-PAK 21 TAB) 10 MG (21) TBPK tablet 10 mg DS 12 as directed 12/04/22   Vickki Hearing, MD  spironolactone (ALDACTONE) 25 MG tablet Take 12.5 mg by mouth daily.    [provider]      Allergies    Bactrim [sulfamethoxazole-trimethoprim], Ciprofloxacin, Nitrofurantoin, Metronidazole, Amoxicillin, and Erythromycin    Review of Systems   Review of Systems  Constitutional:  Positive for malaise/fatigue. Negative for diaphoresis.  Respiratory:  Positive for shortness of breath. Negative for cough.   Cardiovascular:  Positive for chest pain and palpitations.  Skin:  Positive for rash.    Physical Exam Updated Vital Signs BP (!) 185/129 (BP Location: Right Arm)   Pulse (!) 120   Temp 99.7 F (37.6 C) (Oral)   Resp 16   Ht 5\' 4"  (1.626 m)   Wt 92 kg   SpO2 95%   BMI 34.81 kg/m  Physical Exam Vitals and nursing note reviewed.  Constitutional:      General: She is not in acute distress.    Appearance: Normal appearance. She is well-developed.  HENT:     Head: Normocephalic and atraumatic.     Mouth/Throat:     Comments: She has some scabbed over crusting  lesions of her right upper lip. Eyes:     Conjunctiva/sclera: Conjunctivae normal.  Cardiovascular:     Rate and Rhythm: Tachycardia present. Rhythm irregular.     Heart sounds: No murmur heard. Pulmonary:     Effort: Pulmonary effort is normal. No respiratory distress.     Breath sounds: Normal breath sounds.  Abdominal:     Palpations: Abdomen is soft.     Tenderness: There is no abdominal tenderness.  Musculoskeletal:        General: No swelling.     Cervical back: Neck supple.  Skin:    General: Skin is warm and dry.     Capillary Refill: Capillary refill takes less than 2 seconds.  Neurological:      General: No focal deficit present.     Mental Status: She is alert.     Motor: No weakness.     ED Results / Procedures / Treatments   Labs (all labs ordered are listed, but only abnormal results are displayed) Labs Reviewed  BASIC METABOLIC PANEL - Abnormal; Notable for the following components:      Result Value   Glucose, Bld 114 (*)    BUN 41 (*)    Creatinine, Ser 2.10 (*)    Calcium 8.4 (*)    GFR, Estimated 24 (*)    All other components within normal limits  CBC - Abnormal; Notable for the following components:   WBC 11.6 (*)    RBC 3.71 (*)    Hemoglobin 11.4 (*)    All other components within normal limits  BRAIN NATRIURETIC PEPTIDE - Abnormal; Notable for the following components:   B Natriuretic Peptide 217.0 (*)    All other components within normal limits  TROPONIN I (HIGH SENSITIVITY) - Abnormal; Notable for the following components:   Troponin I (High Sensitivity) 19 (*)    All other components within normal limits  TROPONIN I (HIGH SENSITIVITY) - Abnormal; Notable for the following components:   Troponin I (High Sensitivity) 20 (*)    All other components within normal limits  TSH  MAGNESIUM  PROTIME-INR  BASIC METABOLIC PANEL    EKG EKG Interpretation Date/Time:  Saturday December 13 2022 12:04:40 EDT Ventricular Rate:  117 PR Interval:    QRS Duration:  98 QT Interval:  352 QTC Calculation: 491 R Axis:   12  Text Interpretation: Atrial fibrillation with rapid ventricular response Minimal voltage criteria for LVH, may be normal variant ( Cornell product ) Nonspecific ST and T wave abnormality Abnormal ECG When compared with ECG of 18-Aug-2018 11:00, aflutter is new Confirmed by Meridee Score [phone]) on 12/13/2022 12:13:02 PM  Radiology DG Chest 2 View  Result Date: 12/13/2022 CLINICAL DATA:  Chest pain EXAM: CHEST - 2 VIEW COMPARISON:  None Available. FINDINGS: Enlarged cardiac silhouette. Prominent RIGHT pulmonary artery similar to prior. Low  lung volumes. No effusion, infiltrate or pneumothorax. Degenerative osteophytosis of the spine. IMPRESSION: 1. No acute cardiopulmonary process. 2. Cardiomegaly. Electronically Signed   By: Genevive Bi M.D.   On: 12/13/2022 12:34    Procedures .Critical Care  Performed by: Terrilee Files, MD Authorized by: Terrilee Files, MD   Critical care provider statement:    Critical care time (minutes):  45   Critical care time was exclusive of:  Separately billable procedures and treating other patients   Critical care was necessary to treat or prevent imminent or life-threatening deterioration of the following conditions:  Cardiac failure   Critical care was  time spent personally by me on the following activities:  Development of treatment plan with patient or surrogate, discussions with consultants, evaluation of patient's response to treatment, examination of patient, obtaining history from patient or surrogate, ordering and performing treatments and interventions, ordering and review of laboratory studies, ordering and review of radiographic studies, pulse oximetry, re-evaluation of patient's condition and review of old charts   I assumed direction of critical care for this patient from another provider in my specialty: no       Medications Ordered in ED Medications  isosorbide mononitrate (IMDUR) 24 hr tablet 30 mg (30 mg Oral Given 12/13/22 1642)  hydrALAZINE (APRESOLINE) tablet 100 mg (100 mg Oral Given 12/13/22 1643)  metoprolol tartrate (LOPRESSOR) tablet 25 mg (has no administration in time range)  HYDROcodone-acetaminophen (NORCO) 10-325 MG per tablet 1 tablet (has no administration in time range)  furosemide (LASIX) injection 40 mg (40 mg Intravenous Given 12/13/22 1608)  levothyroxine (SYNTHROID) tablet 137 mcg (has no administration in time range)  sodium chloride flush (NS) 0.9 % injection 3 mL (3 mLs Intravenous Given 12/13/22 1611)  sodium chloride flush (NS) 0.9 % injection 3  mL (3 mLs Intravenous Given 12/13/22 1611)  sodium chloride flush (NS) 0.9 % injection 3 mL (has no administration in time range)  0.9 %  sodium chloride infusion (has no administration in time range)  acetaminophen (TYLENOL) tablet 650 mg (has no administration in time range)    Or  acetaminophen (TYLENOL) suppository 650 mg (has no administration in time range)  traZODone (DESYREL) tablet 50 mg (has no administration in time range)  polyethylene glycol (MIRALAX / GLYCOLAX) packet 17 g (has no administration in time range)  bisacodyl (DULCOLAX) suppository 10 mg (has no administration in time range)  ondansetron (ZOFRAN) tablet 4 mg (has no administration in time range)    Or  ondansetron (ZOFRAN) injection 4 mg (has no administration in time range)  heparin injection 5,000 Units (5,000 Units Subcutaneous Given 12/13/22 1606)  escitalopram (LEXAPRO) tablet 10 mg (has no administration in time range)  metoprolol tartrate (LOPRESSOR) injection 5 mg (5 mg Intravenous Given 12/13/22 1314)  labetalol (NORMODYNE) injection 10 mg (10 mg Intravenous Given 12/13/22 1608)    ED Course/ Medical Decision Making/ A&P Clinical Course as of 12/13/22 1743  Sat Dec 13, 2022  1252 Chest x-ray interpreted by me as no acute infiltrate.  Awaiting radiology reading. [MB]  1426 Discussed with [facility] cardiology Dr. Ancil Boozer.  She feels the patient would benefit from admission, rate control blood pressure control, anticoagulation.  She thought she was fine to stay at [facility] and cards could see her Monday if she has not already converted. [MB]  1441 Discussed with Dr. Mariea Clonts [facility] who will evaluate patient for admission. [MB]    Clinical Course User Index [MB] Terrilee Files, MD                                 Medical Decision Making Amount and/or Complexity of Data Reviewed Labs: ordered. Radiology: ordered.  Risk Prescription drug management. Decision regarding  hospitalization.   This patient complains of rapid heart rate chest tightness shortness of breath; this involves an extensive number of treatment Options and is a complaint that carries with it a high risk of complications and morbidity. The differential includes ACS, arrhythmia, palpitations, CHF, pneumonia, PE  I ordered, reviewed and interpreted labs, which included CBC with elevated  white count, hemoglobin slightly lower than baseline, chemistries with mild AKI, troponins elevated need to be trended, BNP elevated, TSH normal I ordered medication IV beta-blocker for rate control and reviewed PMP when indicated. I ordered imaging studies which included chest x-ray and I independently    visualized and interpreted imaging which showed cardiomegaly no gross infiltrate Additional history obtained from patient's companion Previous records obtained and reviewed in epic no recent visits but did see cardiology 3 years ago I consulted cardiology Dr. Ancil Boozer and [facility] Dr. Mariea Clonts and discussed lab and imaging findings and discussed disposition.  Cardiac monitoring reviewed, A-fib with RVR improving to A-fib with controlled rate Social determinants considered, no significant barriers Critical Interventions: Initiation of IV medications for rate control.  Discussion with consultants  After the interventions stated above, I reevaluated the patient and found patient's heart rate to be improved and blood pressure started to trend down. Admission and further testing considered, she would benefit from admission to the hospital for continued IV rate control and workup regarding her atrial fibrillation to determine if cardioversion or rate control and anticoagulation would be of benefit.  Patient in agreement with plan for admission.         Final Clinical Impression(s) / ED Diagnoses Final diagnoses:  Atrial fibrillation with rapid ventricular response (HCC)  AKI (acute kidney injury)  (HCC)  Skin cancer of lip    Rx / DC Orders ED Discharge Orders     None         Terrilee Files, MD 12/13/22 [phone]

## 2022-12-13 NOTE — Progress Notes (Signed)
Alert and oriented and lives at home with husband who has parkinson's and she is primary caregiver for him.  Also, helps with care for disabled sister who lives in an ALF.  Daughter -in-law at bedside and confirms that patient is tired and not taking her medications as should.Kathleen Vance with cane baseline and has chronic left hip pain.

## 2022-12-14 ENCOUNTER — Observation Stay (HOSPITAL_COMMUNITY): Payer: Medicare Other

## 2022-12-14 DIAGNOSIS — Z882 Allergy status to sulfonamides status: Secondary | ICD-10-CM | POA: Diagnosis not present

## 2022-12-14 DIAGNOSIS — I130 Hypertensive heart and chronic kidney disease with heart failure and stage 1 through stage 4 chronic kidney disease, or unspecified chronic kidney disease: Secondary | ICD-10-CM | POA: Diagnosis not present

## 2022-12-14 DIAGNOSIS — E785 Hyperlipidemia, unspecified: Secondary | ICD-10-CM | POA: Diagnosis not present

## 2022-12-14 DIAGNOSIS — Z8249 Family history of ischemic heart disease and other diseases of the circulatory system: Secondary | ICD-10-CM | POA: Diagnosis not present

## 2022-12-14 DIAGNOSIS — I4892 Unspecified atrial flutter: Secondary | ICD-10-CM | POA: Diagnosis not present

## 2022-12-14 DIAGNOSIS — R7303 Prediabetes: Secondary | ICD-10-CM | POA: Diagnosis present

## 2022-12-14 DIAGNOSIS — F32A Depression, unspecified: Secondary | ICD-10-CM | POA: Diagnosis not present

## 2022-12-14 DIAGNOSIS — Z87442 Personal history of urinary calculi: Secondary | ICD-10-CM | POA: Diagnosis not present

## 2022-12-14 DIAGNOSIS — R9431 Abnormal electrocardiogram [ECG] [EKG]: Secondary | ICD-10-CM

## 2022-12-14 DIAGNOSIS — I5042 Chronic combined systolic (congestive) and diastolic (congestive) heart failure: Secondary | ICD-10-CM | POA: Diagnosis not present

## 2022-12-14 DIAGNOSIS — Z79890 Hormone replacement therapy (postmenopausal): Secondary | ICD-10-CM | POA: Diagnosis not present

## 2022-12-14 DIAGNOSIS — E039 Hypothyroidism, unspecified: Secondary | ICD-10-CM | POA: Diagnosis not present

## 2022-12-14 DIAGNOSIS — Z85828 Personal history of other malignant neoplasm of skin: Secondary | ICD-10-CM | POA: Diagnosis not present

## 2022-12-14 DIAGNOSIS — N184 Chronic kidney disease, stage 4 (severe): Secondary | ICD-10-CM | POA: Diagnosis not present

## 2022-12-14 DIAGNOSIS — I509 Heart failure, unspecified: Secondary | ICD-10-CM

## 2022-12-14 DIAGNOSIS — Z888 Allergy status to other drugs, medicaments and biological substances status: Secondary | ICD-10-CM | POA: Diagnosis not present

## 2022-12-14 DIAGNOSIS — Z91199 Patient's noncompliance with other medical treatment and regimen due to unspecified reason: Secondary | ICD-10-CM | POA: Diagnosis not present

## 2022-12-14 DIAGNOSIS — D631 Anemia in chronic kidney disease: Secondary | ICD-10-CM | POA: Diagnosis not present

## 2022-12-14 DIAGNOSIS — Z881 Allergy status to other antibiotic agents status: Secondary | ICD-10-CM | POA: Diagnosis not present

## 2022-12-14 DIAGNOSIS — C4400 Unspecified malignant neoplasm of skin of lip: Secondary | ICD-10-CM | POA: Diagnosis not present

## 2022-12-14 DIAGNOSIS — Z79899 Other long term (current) drug therapy: Secondary | ICD-10-CM | POA: Diagnosis not present

## 2022-12-14 DIAGNOSIS — R0789 Other chest pain: Secondary | ICD-10-CM | POA: Diagnosis present

## 2022-12-14 DIAGNOSIS — I4891 Unspecified atrial fibrillation: Principal | ICD-10-CM

## 2022-12-14 DIAGNOSIS — Z6838 Body mass index (BMI) 38.0-38.9, adult: Secondary | ICD-10-CM | POA: Diagnosis not present

## 2022-12-14 DIAGNOSIS — N179 Acute kidney failure, unspecified: Secondary | ICD-10-CM | POA: Diagnosis not present

## 2022-12-14 DIAGNOSIS — K219 Gastro-esophageal reflux disease without esophagitis: Secondary | ICD-10-CM | POA: Diagnosis present

## 2022-12-14 LAB — CBC
HCT: 34.1 % — ABNORMAL LOW (ref 36.0–46.0)
Hemoglobin: 10.5 g/dL — ABNORMAL LOW (ref 12.0–15.0)
MCH: 30.5 pg (ref 26.0–34.0)
MCHC: 30.8 g/dL (ref 30.0–36.0)
MCV: 99.1 fL (ref 80.0–100.0)
Platelets: 232 10*3/uL (ref 150–400)
RBC: 3.44 MIL/uL — ABNORMAL LOW (ref 3.87–5.11)
RDW: 15 % (ref 11.5–15.5)
WBC: 11.9 10*3/uL — ABNORMAL HIGH (ref 4.0–10.5)
nRBC: 0.2 % (ref 0.0–0.2)

## 2022-12-14 LAB — BASIC METABOLIC PANEL WITH GFR
Anion gap: 11 (ref 5–15)
BUN: 42 mg/dL — ABNORMAL HIGH (ref 8–23)
CO2: 24 mmol/L (ref 22–32)
Calcium: 8.2 mg/dL — ABNORMAL LOW (ref 8.9–10.3)
Chloride: 103 mmol/L (ref 98–111)
Creatinine, Ser: 2.3 mg/dL — ABNORMAL HIGH (ref 0.44–1.00)
GFR, Estimated: 22 mL/min — ABNORMAL LOW (ref >60)
Glucose, Bld: 107 mg/dL — ABNORMAL HIGH (ref 70–99)
Potassium: 3.9 mmol/L (ref 3.5–5.1)
Sodium: 138 mmol/L (ref 135–145)

## 2022-12-14 LAB — TROPONIN I (HIGH SENSITIVITY): Troponin I (High Sensitivity): 20 ng/L — ABNORMAL HIGH (ref <18)

## 2022-12-14 LAB — ECHOCARDIOGRAM COMPLETE
Area-P 1/2: 4.89 cm2
Calc EF: 46.7 %
Height: 64 in
S' Lateral: 3.7 cm
Single Plane A2C EF: 45.5 %
Single Plane A4C EF: 45.1 %
Weight: 3184 [oz_av]

## 2022-12-14 MED ORDER — METOPROLOL TARTRATE 50 MG PO TABS
50.0000 mg | ORAL_TABLET | Freq: Three times a day (TID) | ORAL | Status: DC
  Administered 2022-12-14 – 2022-12-15 (×2): 50 mg via ORAL
  Filled 2022-12-14 (×2): qty 1

## 2022-12-14 MED ORDER — ADULT MULTIVITAMIN W/MINERALS CH
1.0000 | ORAL_TABLET | Freq: Every day | ORAL | Status: DC
  Administered 2022-12-14 – 2022-12-15 (×2): 1 via ORAL
  Filled 2022-12-14 (×2): qty 1

## 2022-12-14 MED ORDER — ENSURE ENLIVE PO LIQD
237.0000 mL | Freq: Two times a day (BID) | ORAL | Status: DC
  Administered 2022-12-15: 237 mL via ORAL

## 2022-12-14 NOTE — Progress Notes (Signed)
Patient has been stable.  Paitent has c/o back and hip pain. Patient vitals have been stable, blood pressure improved and pulse stable.  Patient did have a small amount of nausea in beginning of shift.

## 2022-12-14 NOTE — Progress Notes (Signed)
  Echocardiogram 2D Echocardiogram has been performed.  Janalyn Harder 12/14/2022, 11:47 AM

## 2022-12-14 NOTE — Progress Notes (Signed)
   12/14/22 1519  TOC Brief Assessment  Insurance and Status Reviewed  Patient has primary care physician Yes  Home environment has been reviewed From home  Prior level of function: Independent  Prior/Current Home Services No current home services  Social Determinants of Health Reivew SDOH reviewed no interventions necessary  Readmission risk has been reviewed Yes  Transition of care needs no transition of care needs at this time   Pt from home, provides care to spouse who has parkinson's per record. TOC to follow should resources, support be needed.

## 2022-12-14 NOTE — Progress Notes (Signed)
Initial Nutrition Assessment  DOCUMENTATION CODES:   Obesity unspecified  INTERVENTION:   -Liberalize diet to 2 gram sodium for wider variety of meal selections -MVI with minerals daily -Ensure Enlive po BID, each supplement provides 350 kcal and 20 grams of protein -Spiritual care consult -RD provided "Low Sodium Nutrition Therapy" handout from [facility] Nutrition Care Manual; attached to AVS/ discharge summary   NUTRITION DIAGNOSIS:   Increased nutrient needs related to chronic illness (CHF) as evidenced by estimated needs.  GOAL:   Patient will meet greater than or equal to 90% of their needs  MONITOR:   PO intake, Supplement acceptance  REASON FOR ASSESSMENT:   Malnutrition Screening Tool    ASSESSMENT:   Pt with past medical history relevant for obesity, CKD IV, basal cell carcinoma, hypothyroidism, hypertension, HLD, and history of glucose intolerance admitted on 12/13/2022 with new onset atrial fibrillation/atrial flutter and CHF exacerbation in the setting of poor compliance with medications prior to admission  Pt admitted with new onset a-fib, uncontrolled HTN, and CHF exacerbation.  Reviewed I/O's: +120 ml x 24 hours   Pt unavailable at time of visit. Attempted to speak with pt via call to hospital room phone, however, unable to reach. RD unable to obtain further nutrition-related history or complete nutrition-focused physical exam at this time.    Per H&P, pt feels overwhelmed secondary to being the caregiver of her husband of Parkinson's and her sister with a developmental delay. Pt also with poor medication compliance PTA.   Pt is currently on a heart healthy diet. Noted meal completions 50%.  No wt loss noted over the past 3 months, however, suspect edema (moderate per nursing assessment) may be masking true weight loss as well as fat and muscle depletions.  Medications reviewed and include lexapro, and lasix.  Labs reviewed: CBGS: 119.   Diet Order:    Diet Order             Diet Heart Room service appropriate? Yes; Fluid consistency: Thin  Diet effective now                   EDUCATION NEEDS:   No education needs have been identified at this time  Skin:  Skin Assessment: Reviewed RN Assessment  Last BM:  12/14/22  Height:   Ht Readings from Last 1 Encounters:  12/13/22 5\' 4"  (1.626 m)    Weight:   Wt Readings from Last 1 Encounters:  12/13/22 90.3 kg    Ideal Body Weight:  54.5 kg  BMI:  Body mass index is 34.16 kg/m.  Estimated Nutritional Needs:   Kcal:  1650-1850  Protein:  85-100 grams  Fluid:  1.6-1.8 L    Levada Schilling, RD, LDN, CDCES Registered Dietitian II Certified Diabetes Care and Education Specialist Please refer to [facility] for RD and/or RD on-call/weekend/after hours pager

## 2022-12-14 NOTE — Progress Notes (Signed)
PROGRESS NOTE  Kathleen Vance, is a 72 y.o. female, DOB - [birth year], ZOX:[id number]  Admit date - 12/13/2022   Admitting Physician  Mariea Clonts, MD  Outpatient Primary MD for the patient is Benita Stabile, MD  LOS - 0  Chief Complaint  Patient presents with   Palpitations   Brief Narrative:  - 72 y.o. female with past medical history relevant for obesity, CKD IV, basal cell carcinoma, hypothyroidism, hypertension, HLD, and history of glucose intolerance admitted on 12/13/2022 with new onset atrial fibrillation/atrial flutter and CHF exacerbation in the setting of poor compliance with medications prior to admission    -Assessment and Plan: 1)New onset Atrial Fibrillation/Atrial Flutter=--- prior echo from 2021 with EF of 45 to 50% -Repeat echo on 12/14/2022 with EF of 45 to 50%, global hypokinesis asymmetric left ventricular hypertrophy, no aortic stenosis, no mitral stenosis, dilated atria noted -Avoid Cardizem given borderline low EF and wall motion abnormalities -Titrate up metoprolol for rate control -TSH 1.0  CHA2DS2- VASc score   is = 4 (age, gender, HTN, CHF)   Which is  equal to = 4  % annual risk of stroke  This patients CHA2DS2-VASc Score and unadjusted Ischemic Stroke Rate (% per year) is equal to 4.8 % stroke rate/year from a score of 4 -Hold off on anticoagulation as patient is not likely to be compliant unfortunately and hgb is drifting down --- -Patient would like to talk about risk versus benefit of anticoagulation with family members prior to deciding on Eliquis -EDP discussed with on-call cardiologist Dr. Jenene Slicker-   2)Uncontrolled Hypertension--- started blood pressure initially over 200 mmhg by EMS -BP in the ED 185/122 -after Interventions BP down to 165/114 -IV labetalol as needed -Restarted PTA Imdur/hydralazine combo which patient was not compliant with -Titrate up metoprolol as above #1   3) chest discomfort--- most likely due to #1 #2 above Troponin 19  repeat troponin 20----twelve-lead EKG shows minimal voltage criteria for LVH, may be normal variant ( Cornell product ) Nonspecific ST and T wave abnormality -No further chest pains -Aspirin and metoprolol as ordered -Echo as above #1   4)HFpEF--history of chronic diastolic dysfunction CHF-- prior echo from 2021 with EF of 45 to 50%, with grade 3 diastolic dysfunction -Repeat echo as above #1 -c/n IV Lasix as ordered -Daily weight -Fluid input and output monitoring   5)Depression--- caregiver burnout -Daughter-in-law Germaine Pomfret at bedside, patient admits to feeling overwhelmed as she is a primary caregiver for her 73 year old man who has Parkinson's... And her sister who has developmental delay... -Denies homicidal or suicidal ideation or plan -Agreeable to take Lexapro and trazodone   6)CKD stage -  iV - -Creatinine on admission 2.10 which is not far from prior baseline -Monitor closely especially while given IV Lasix (currently trending up with diuresis) -- renally adjust medications, avoid nephrotoxic agents / dehydration  / hypotension   7)right-sided facial basal cell carcinoma--outpatient follow-up with dermatology for Mohs surgery as previously advised   8) chronic anemia---Hemoglobin is drifting down -Suspect some component of anemia of CKD Denies black stools -No bleeding concerns, monitor closely   9) history of medication and lifestyle noncompliance--mostly due to #5 above   10) hypothyroidism--- TSH 1.0, continue levothyroxine   Dispo: The patient is from: Home              Anticipated d/c is to: Home  Status is: Inpatient   Disposition: The patient is from: Home  Anticipated d/c is to: Home              Anticipated d/c date is: 1 day              Patient currently is not medically stable to d/c. Barriers: Not Clinically Stable-   Code Status :  -  Code Status: Full Code   Family Communication:    (patient is alert, awake and coherent)  Discussed  with son Trey Paula at bedside  DVT Prophylaxis  :   - SCDs  heparin injection 5,000 Units Start: 12/13/22 1630 SCDs Start: 12/13/22 1447 Place TED hose Start: 12/13/22 1447   Lab Results  Component Value Date   PLT 232 12/14/2022    Inpatient Medications  Scheduled Meds:  aspirin EC  81 mg Oral Q breakfast   escitalopram  10 mg Oral Daily   furosemide  40 mg Intravenous Daily   heparin  5,000 Units Subcutaneous Q8H   hydrALAZINE  100 mg Oral TID   isosorbide mononitrate  30 mg Oral Daily   levothyroxine  137 mcg Oral Q0600   metoprolol tartrate  25 mg Oral Q6H   sodium chloride flush  3 mL Intravenous Q12H   sodium chloride flush  3 mL Intravenous Q12H   traZODone  50 mg Oral QHS   Continuous Infusions:  sodium chloride     PRN Meds:.sodium chloride, acetaminophen **OR** acetaminophen, alum & mag hydroxide-simeth, bisacodyl, HYDROcodone-acetaminophen, ondansetron **OR** ondansetron (ZOFRAN) IV, polyethylene glycol, sodium chloride flush   Anti-infectives (From admission, onward)    None         Subjective: Kathleen Vance today has no fevers, no emesis,  No chest pain,    Son Trey Paula at bedside Voiding well, no further dyspnea at rest, dyspnea on exertion persist  Objective: Vitals:   12/13/22 1937 12/14/22 0000 12/14/22 0359 12/14/22 0500  BP: (!) 141/95 116/69 128/85 (!) 140/82  Pulse: 90 85 (!) 58   Resp: 18  20   Temp: 98.7 F (37.1 C)  98.1 F (36.7 C)   TempSrc: Oral  Oral   SpO2: 97%  95%   Weight:      Height:        Intake/Output Summary (Last 24 hours) at 12/14/2022 1543 Last data filed at 12/14/2022 1100 Gross per 24 hour  Intake 120 ml  Output --  Net 120 ml   Filed Weights   12/13/22 1156 12/13/22 1718  Weight: 92 kg 90.3 kg    Physical Exam  Gen:- Awake Alert,  in no apparent distress  HEENT:- [place].AT, No sclera icterus Neck-Supple Neck,No JVD,.  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  Radiology Studies: ECHOCARDIOGRAM COMPLETE  Result Date: 12/14/2022    ECHOCARDIOGRAM REPORT   Patient Name:   Kathleen Vance Date of Exam: 12/14/2022 Medical Rec #:  [id number]         Height:       64.0 in Accession #:    [id number]        Weight:       199.0 lb Date of Birth:  03/22/1951         BSA:          1.952 m Patient Age:    72 years          BP:           140/82 mmHg Patient Gender: F                 HR:           70 bpm. [facility] Procedure: 2D Echo, Cardiac Doppler and Color Doppler Indications:    R94.31 Abnormal EKG  History:        Patient has prior history of Echocardiogram examinations, most                 recent 08/26/2019. Abnormal ECG, Arrythmias:Atrial Fibrillation;                 Signs/Symptoms:Chest Pain.  Sonographer:    Sheralyn Boatman RDCS Referring Phys: [id number]   IMPRESSIONS  1. Left ventricular ejection fraction, by estimation, is 45 to 50%. The left ventricle has mildly decreased function. The left ventricle demonstrates global hypokinesis. There is severe asymmetric left ventricular  hypertrophy of the lateral segment. Left ventricular diastolic parameters are indeterminate.  2. Right ventricular systolic function is low normal. The right ventricular size is normal. Tricuspid regurgitation signal is inadequate for assessing PA pressure.  3. Left atrial size was moderately dilated.  4. Right atrial size was mildly dilated.  5. The mitral valve is normal in structure. Mild mitral valve regurgitation. No evidence of mitral stenosis.  6. The aortic valve is tricuspid. Aortic valve regurgitation is not visualized. No aortic stenosis is present.  7. The inferior vena cava is normal in size with <50% respiratory variability, suggesting right atrial pressure of 8 mmHg. Comparison(s): No significant change from prior study. FINDINGS  Left Ventricle: Left ventricular ejection fraction, by estimation, is 45 to 50%. The left ventricle has mildly decreased function. The left ventricle demonstrates global hypokinesis. The left ventricular internal cavity size was normal in size. There is  severe asymmetric left ventricular hypertrophy of the lateral segment. Left ventricular diastolic function could not be evaluated due to atrial fibrillation. Left ventricular diastolic parameters are indeterminate. Right Ventricle: The right ventricular size is normal. No increase in right ventricular wall thickness. Right ventricular systolic function is low normal. Tricuspid regurgitation signal is inadequate for assessing PA pressure. Left Atrium: Left atrial size was moderately dilated. Right Atrium: Right atrial size was mildly dilated. Pericardium: There is no evidence of pericardial effusion. Mitral Valve: The mitral valve is normal in structure. Mild mitral valve regurgitation. No evidence of mitral valve stenosis. Tricuspid Valve: The tricuspid valve is not well visualized. Tricuspid valve regurgitation is trivial. No evidence of tricuspid stenosis. Aortic Valve:  The aortic valve is tricuspid. Aortic valve  regurgitation is not visualized. No aortic stenosis is present. Pulmonic Valve: The pulmonic valve was normal in structure. Pulmonic valve regurgitation is trivial. No evidence of pulmonic stenosis. Aorta: The aortic root and ascending aorta are structurally normal, with no evidence of dilitation. Venous: The inferior vena cava is normal in size with less than 50% respiratory variability, suggesting right atrial pressure of 8 mmHg. IAS/Shunts: No atrial level shunt detected by color flow Doppler.  LEFT VENTRICLE PLAX 2D LVIDd:         4.60 cm LVIDs:         3.70 cm LV PW:         1.80 cm LV IVS:        1.20 cm LVOT diam:     2.10 cm LV SV:         72 LV SV Index:   37 LVOT Area:     3.46 cm  LV Volumes (MOD) LV vol d, MOD A2C: 130.0 ml LV vol d, MOD A4C: 99.7 ml LV vol s, MOD A2C: 70.9 ml LV vol s, MOD A4C: 54.7 ml LV SV MOD A2C:     59.1 ml LV SV MOD A4C:     99.7 ml LV SV MOD BP:      56.0 ml RIGHT VENTRICLE             IVC RV S prime:     10.20 cm/s  IVC diam: 2.00 cm RVOT diam:      2.70 cm TAPSE (M-mode): 1.8 cm LEFT ATRIUM              Index        RIGHT ATRIUM           Index LA diam:        3.40 cm  1.74 cm/m   RA Area:     21.90 cm LA Vol (A2C):   103.0 ml 52.76 ml/m  RA Volume:   64.70 ml  33.14 ml/m LA Vol (A4C):   86.4 ml  44.26 ml/m LA Biplane Vol: 96.8 ml  49.58 ml/m  AORTIC VALVE LVOT Vmax:   134.00 cm/s LVOT Vmean:  84.000 cm/s LVOT VTI:    0.208 m  AORTA Ao Root diam: 2.90 cm Ao Asc diam:  3.30 cm MITRAL VALVE MV Area (PHT): 4.89 cm     SHUNTS MV Decel Time: 155 msec     Systemic VTI:  0.21 m MV E velocity: 122.00 cm/s  Systemic Diam: 2.10 cm                             Pulmonic Diam: 2.70 cm Vishnu Priya Mallipeddi Electronically signed by Winfield Rast Mallipeddi Signature Date/Time: 12/14/2022/12:56:15 PM    Final    DG Chest 2 View  Result Date: 12/13/2022 CLINICAL DATA:  Chest pain EXAM: CHEST - 2 VIEW COMPARISON:  None Available. FINDINGS: Enlarged cardiac silhouette. Prominent RIGHT  pulmonary artery similar to prior. Low lung volumes. No effusion, infiltrate or pneumothorax. Degenerative osteophytosis of the spine. IMPRESSION: 1. No acute cardiopulmonary process. 2. Cardiomegaly. Electronically Signed   By: Genevive Bi M.D.   On: 12/13/2022 12:34    Scheduled Meds:  aspirin EC  81 mg Oral Q breakfast   escitalopram  10 mg Oral Daily   furosemide  40 mg Intravenous Daily   heparin  5,000 Units Subcutaneous Q8H   hydrALAZINE  100 mg Oral TID   isosorbide mononitrate  30 mg Oral Daily   levothyroxine  137 mcg Oral Q0600   metoprolol tartrate  25 mg Oral Q6H   sodium chloride flush  3 mL Intravenous Q12H   sodium chloride flush  3 mL Intravenous Q12H   traZODone  50 mg Oral QHS   Continuous Infusions:  sodium chloride      LOS: 0 days   Shon Hale M.D on 12/14/2022 at 3:43 PM  Go to [email or web address] - for contact info  [facility] - Office  [phone]  If 7PM-7AM, please contact night-coverage [email or web address] 12/14/2022, 3:43 PM

## 2022-12-14 NOTE — Discharge Instructions (Signed)

## 2022-12-15 DIAGNOSIS — I4891 Unspecified atrial fibrillation: Secondary | ICD-10-CM | POA: Diagnosis not present

## 2022-12-15 MED ORDER — HYDRALAZINE HCL 50 MG PO TABS: 50.0000 mg | ORAL_TABLET | Freq: Three times a day (TID) | ORAL | 3 refills | Status: AC

## 2022-12-15 MED ORDER — FUROSEMIDE 40 MG PO TABS: 40.0000 mg | ORAL_TABLET | Freq: Every day | ORAL | 11 refills | Status: DC

## 2022-12-15 MED ORDER — ACETAMINOPHEN 325 MG PO TABS: 650.0000 mg | ORAL_TABLET | Freq: Four times a day (QID) | ORAL | Status: DC | PRN

## 2022-12-15 MED ORDER — FUROSEMIDE 10 MG/ML IJ SOLN
20.0000 mg | Freq: Every day | INTRAMUSCULAR | Status: DC
  Administered 2022-12-15: 20 mg via INTRAVENOUS
  Filled 2022-12-15: qty 2

## 2022-12-15 MED ORDER — LISINOPRIL 10 MG PO TABS: 10.0000 mg | ORAL_TABLET | Freq: Every day | ORAL | 11 refills | Status: DC

## 2022-12-15 MED ORDER — SPIRONOLACTONE 25 MG PO TABS: 12.5000 mg | ORAL_TABLET | Freq: Every morning | ORAL | 3 refills | Status: DC

## 2022-12-15 MED ORDER — LEVOTHYROXINE SODIUM 137 MCG PO TABS: 137.0000 ug | ORAL_TABLET | Freq: Every day | ORAL | 5 refills | Status: AC

## 2022-12-15 MED ORDER — ISOSORBIDE MONONITRATE ER 30 MG PO TB24: 30.0000 mg | ORAL_TABLET | Freq: Every day | ORAL | 5 refills | Status: AC

## 2022-12-15 MED ORDER — METOPROLOL SUCCINATE ER 100 MG PO TB24: 100.0000 mg | ORAL_TABLET | Freq: Every day | ORAL | 11 refills | Status: DC

## 2022-12-15 MED ORDER — ESCITALOPRAM OXALATE 10 MG PO TABS: 10.0000 mg | ORAL_TABLET | Freq: Every day | ORAL | 5 refills | Status: AC

## 2022-12-15 MED ORDER — TRAZODONE HCL 50 MG PO TABS: 50.0000 mg | ORAL_TABLET | Freq: Every day | ORAL | 3 refills | Status: AC

## 2022-12-15 MED ORDER — ASPIRIN 81 MG PO TBEC: 81.0000 mg | DELAYED_RELEASE_TABLET | Freq: Every day | ORAL | 12 refills | Status: DC

## 2022-12-15 NOTE — Discharge Summary (Signed)
Kathleen Vance, is a 72 y.o. female  DOB Jul 16, 1950  MRN [id number].  Admission date:  12/13/2022  Admitting Physician  Shon Hale, MD  Discharge Date:  12/15/2022   Primary MD  Benita Stabile, MD  Recommendations for primary care physician for things to follow:   1)Avoid ibuprofen/Advil/Aleve/Motrin/Goody Powders/Naproxen/BC powders/Meloxicam/Diclofenac/Indomethacin and other Nonsteroidal anti-inflammatory medications as these will make you more likely to bleed and can cause stomach ulcers, can also cause Kidney problems.   2)Repeat BMP and CBC Blood Tests in week  3) outpatient follow-up with cardiologist to discuss management of atrial fibrillation and possible full anticoagulation like Eliquis  4) please take medications as prescribed  5) check your blood pressure daily and take your blood pressure diary with you when you see your primary care physician in about a week or so--- your current blood pressure medications may have to be adjusted at that time  6)Very low-salt diet advised--less than 2 g of sodium per day  7)Weigh yourself daily, call if you gain more than 3 pounds in 1 day or more than 5 pounds in 1 week as your diuretic medications may need to be adjusted  Admission Diagnosis  Atrial fibrillation with RVR (HCC) [I48.91] CHF (congestive heart failure) (HCC) [I50.9]   Discharge Diagnosis  Atrial fibrillation with RVR (HCC) [I48.91] CHF (congestive heart failure) (HCC) [I50.9]    Principal Problem:   Atrial fibrillation with RVR (HCC) Active Problems:   Hypertension   Hypothyroidism   Hyperlipidemia   Gastroesophageal reflux disease   Class 2 obesity due to excess calories with body mass index (BMI) of 38.0 to 38.9 in adult   CKD (chronic kidney disease), stage IV (HCC)   CHF (congestive heart failure) (HCC)      Past Medical History:  Diagnosis Date   Aortic stenosis     Arthritis    Celiac artery stenosis (HCC)    Gout    History of kidney stones    Hyperlipidemia    Hypertension    Hypothyroidism    Kidney failure    Prediabetes    Renal artery stenosis (HCC)    Shingles rash     Past Surgical History:  Procedure Laterality Date   ANKLE SURGERY Right    CATARACT EXTRACTION Bilateral    CESAREAN SECTION     CHOLECYSTECTOMY     DILATION AND CURETTAGE OF UTERUS     PARTIAL HYSTERECTOMY     REPAIR KNEE LIGAMENT Right    TUBAL LIGATION         HPI  from the history and physical done on the day of admission:   Kathleen Vance  is a 72 y.o. female with past medical history relevant for obesity, CKD IV, hypothyroidism, hypertension, HLD, and history of glucose intolerance who presents to the ED with elevated BP, shortness of some component of many of CKD palpitations and chest discomfort in the setting of noncompliance with medications prior to admission -Daughter-in-law at bedside patient admits to feeling overwhelmed as she is a  primary caregiver for her 73 year old man who has Parkinson's... And her sister who has developmental delay... She has not followed up for her right-sided facial basal cell carcinoma -Admits to depressive symptoms and sleep disorder, denies suicidal or homicidal ideation or plan No fever  Or chills  -Additional history obtained from daughter-in-law Germaine Pomfret  No Nausea, Vomiting or Diarrhea -BNP 217 -TSH 1.0 -Troponin 19 repeat troponin 20----twelve-lead EKG shows minimal voltage criteria for LVH, may be normal variant ( Cornell product ) Nonspecific ST and T wave abnormality Abnormal ECG When compared with ECG of 18-Aug-2018 11:00, aflutter is new -INR is 1.1 -Creatinine 2.10 which is not far from prior baseline -Hemoglobin is 9.4 -BP discussed with on-call cardiologist Dr. Jenene Slicker- -chest x-ray with cardiomegaly otherwise no acute findings   Hospital Course:     Brief Narrative:  - 72 y.o. female with past  medical history relevant for obesity, CKD IV, basal cell carcinoma, hypothyroidism, hypertension, HLD, and history of glucose intolerance admitted on 12/13/2022 with new onset atrial fibrillation/atrial flutter and CHF exacerbation in the setting of poor compliance with medications prior to admission     -Assessment and Plan: 1)New onset Atrial Fibrillation/Atrial Flutter=--- prior echo from 2021 with EF of 45 to 50% -Repeat echo on 12/14/2022 with EF of 45 to 50%, global hypokinesis asymmetric left ventricular hypertrophy, no aortic stenosis, no mitral stenosis, dilated atria noted -Avoid Cardizem given borderline low EF and wall motion abnormalities -Titrate up metoprolol for rate control -TSH 1.0  CHA2DS2- VASc score   is = 4 (age, gender, HTN, CHF)   Which is  equal to = 4  % annual risk of stroke  This patients CHA2DS2-VASc Score and unadjusted Ischemic Stroke Rate (% per year) is equal to 4.8 % stroke rate/year from a score of 4 -Hold off on anticoagulation as patient is not likely to be compliant unfortunately and hgb is drifting down --- -Patient would like to talk about risk versus benefit of anticoagulation with family members prior to deciding on Eliquis -EDP discussed with on-call cardiologist Dr. Jenene Slicker-   2)Uncontrolled Hypertension--- started blood pressure initially over 200 mmhg by EMS -BP in the ED 185/122 -after Interventions BP down to 165/114 -IV labetalol as needed -Restarted PTA Imdur/hydralazine combo which patient was not compliant with -Titrate up metoprolol as above #1   3) chest discomfort--- most likely due to #1 #2 above Troponin 19 repeat troponin 20----twelve-lead EKG shows minimal voltage criteria for LVH, may be normal variant ( Cornell product ) Nonspecific ST and T wave abnormality -No further chest pains -Aspirin and metoprolol as ordered -Echo as above #1   4)HFpEF--history of chronic diastolic dysfunction CHF-- prior echo from 2021 with EF of 45  to 50%, with grade 3 diastolic dysfunction -Repeat echo as above #1 -c/n IV Lasix as ordered -Daily weight -Fluid input and output monitoring   5)Depression--- caregiver burnout -Daughter-in-law Germaine Pomfret at bedside, patient admits to feeling overwhelmed as she is a primary caregiver for her 73 year old man who has Parkinson's... And her sister who has developmental delay... -Denies homicidal or suicidal ideation or plan -Agreeable to take Lexapro and trazodone   6)CKD stage -  iV - -Creatinine on admission 2.10 which is not far from prior baseline -Monitor closely especially while given IV Lasix (currently trending up with diuresis) -- renally adjust medications, avoid nephrotoxic agents / dehydration  / hypotension   7)right-sided facial basal cell carcinoma--outpatient follow-up with dermatology for Mohs surgery as previously advised  8) chronic anemia---Hemoglobin is drifting down -Suspect some component of anemia of CKD Denies black stools -No bleeding concerns, monitor closely   9) history of medication and lifestyle noncompliance--mostly due to #5 above   10) hypothyroidism--- TSH 1.0, continue levothyroxine   Dispo: The patient is from: Home              Anticipated d/c is to: Home   Status is: Inpatient    Disposition: The patient is from: Home              Anticipated d/c is to: Home       Discharge Condition: ***  Follow UP   Follow-up Information     Benita Stabile, MD. Schedule an appointment as soon as possible for a visit in 1 week(s).   Specialty: Internal Medicine Why: Repeat CBC and BMP within 1 week Contact information: [address] [place] [facility] [address] [phone]         Antoine Poche, MD. Schedule an appointment as soon as possible for a visit in 2 week(s).   Specialty: Cardiology Why: Afib----?? DOAC Contact information: [address] [place] [address] [phone]                  Consults obtained -  ***  Diet and Activity recommendation:  As advised  Discharge Instructions    **** Discharge Instructions     Call MD for:  difficulty breathing, headache or visual disturbances   Complete by: As directed    Call MD for:  persistant dizziness or light-headedness   Complete by: As directed    Call MD for:  temperature >100.4   Complete by: As directed    Diet - low sodium heart healthy   Complete by: As directed    Discharge instructions   Complete by: As directed    1)Avoid ibuprofen/Advil/Aleve/Motrin/Goody Powders/Naproxen/BC powders/Meloxicam/Diclofenac/Indomethacin and other Nonsteroidal anti-inflammatory medications as these will make you more likely to bleed and can cause stomach ulcers, can also cause Kidney problems.   2)Repeat BMP and CBC Blood Tests in week  3) outpatient follow-up with cardiologist to discuss management of atrial fibrillation and possible full anticoagulation like Eliquis  4) please take medications as prescribed  5) check your blood pressure daily and take your blood pressure diary with you when you see your primary care physician in about a week or so--- your current blood pressure medications may have to be adjusted at that time  6)Very low-salt diet advised--less than 2 g of sodium per day  7)Weigh yourself daily, call if you gain more than 3 pounds in 1 day or more than 5 pounds in 1 week as your diuretic medications may need to be adjusted   Increase activity slowly   Complete by: As directed          Discharge Medications     Allergies as of 12/15/2022       Reactions   Bactrim [sulfamethoxazole-trimethoprim]    Nausea, diarrhea   Ciprofloxacin Anaphylaxis   Nitrofurantoin Anaphylaxis   Metronidazole    "sick"   Amoxicillin Rash   Erythromycin Rash        Medication List     STOP taking these medications    labetalol 300 MG tablet Commonly known as: NORMODYNE   predniSONE 10 MG (21) Tbpk tablet Commonly known as:  STERAPRED UNI-PAK 21 TAB       TAKE these medications    acetaminophen 325 MG tablet  Commonly known as: TYLENOL Take 2 tablets (650 mg total) by mouth every 6 (six) hours as needed for mild pain (or Fever >/= 101).   allopurinol 100 MG tablet Commonly known as: ZYLOPRIM Take 100 mg by mouth daily.   aspirin EC 81 MG tablet Take 1 tablet (81 mg total) by mouth daily with breakfast. Swallow whole. Start taking on: December 16, 2022   escitalopram 10 MG tablet Commonly known as: LEXAPRO Take 1 tablet (10 mg total) by mouth daily. Start taking on: December 16, 2022   furosemide 40 MG tablet Commonly known as: Lasix Take 1 tablet (40 mg total) by mouth daily. What changed:  medication strength how much to take additional instructions   hydrALAZINE 50 MG tablet Commonly known as: APRESOLINE Take 1 tablet (50 mg total) by mouth 3 (three) times daily. What changed:  medication strength how much to take when to take this   HYDROcodone-acetaminophen 10-325 MG tablet Commonly known as: NORCO Take 1 tablet by mouth 4 (four) times daily.   isosorbide mononitrate 30 MG 24 hr tablet Commonly known as: IMDUR Take 1 tablet (30 mg total) by mouth daily. For Heart What changed: additional instructions   levothyroxine 137 MCG tablet Commonly known as: SYNTHROID Take 1 tablet (137 mcg total) by mouth daily before breakfast.   lisinopril 10 MG tablet Commonly known as: ZESTRIL Take 1 tablet (10 mg total) by mouth daily. What changed:  medication strength how much to take   metoprolol succinate 100 MG 24 hr tablet Commonly known as: Toprol XL Take 1 tablet (100 mg total) by mouth daily. Take with or immediately following a meal.   spironolactone 25 MG tablet Commonly known as: ALDACTONE Take 0.5 tablets (12.5 mg total) by mouth every morning. What changed:  when to take this reasons to take this   traZODone 50 MG tablet Commonly known as: DESYREL Take 1 tablet (50 mg  total) by mouth at bedtime. Mood and sleep        Major procedures and Radiology Reports - PLEASE review detailed and final reports for all details, in brief -   ***  ECHOCARDIOGRAM COMPLETE  Result Date: 12/14/2022    ECHOCARDIOGRAM REPORT   Patient Name:   Kathleen Vance Date of Exam: 12/14/2022 Medical Rec #:  [id number]         Height:       64.0 in Accession #:    [id number]        Weight:       199.0 lb Date of Birth:  [birth year]         BSA:          1.952 m Patient Age:    72 years          BP:           140/82 mmHg Patient Gender: F                 HR:           70 bpm. [facility] Procedure: 2D Echo, Cardiac Doppler and Color Doppler Indications:    R94.31 Abnormal EKG  History:        Patient has prior history of Echocardiogram examinations, most                 recent 08/26/2019. Abnormal ECG, Arrythmias:Atrial Fibrillation;                 Signs/Symptoms:Chest Pain.  Sonographer:  Sheralyn Boatman RDCS Referring Phys: [id number]   IMPRESSIONS  1. Left ventricular ejection fraction, by estimation, is 45 to 50%. The left ventricle has mildly decreased function. The left ventricle demonstrates global hypokinesis. There is severe asymmetric left ventricular hypertrophy of the lateral segment. Left ventricular diastolic parameters are indeterminate.  2. Right ventricular systolic function is low normal. The right ventricular size is normal. Tricuspid regurgitation signal is inadequate for assessing PA pressure.  3. Left atrial size was moderately dilated.  4. Right atrial size was mildly dilated.  5. The mitral valve is normal in structure. Mild mitral valve regurgitation. No evidence of mitral stenosis.  6. The aortic valve is tricuspid. Aortic valve regurgitation is not visualized. No aortic stenosis is present.  7. The inferior vena cava is normal in size with <50% respiratory variability, suggesting right atrial pressure of 8 mmHg. Comparison(s): No significant change from  prior study. FINDINGS  Left Ventricle: Left ventricular ejection fraction, by estimation, is 45 to 50%. The left ventricle has mildly decreased function. The left ventricle demonstrates global hypokinesis. The left ventricular internal cavity size was normal in size. There is  severe asymmetric left ventricular hypertrophy of the lateral segment. Left ventricular diastolic function could not be evaluated due to atrial fibrillation. Left ventricular diastolic parameters are indeterminate. Right Ventricle: The right ventricular size is normal. No increase in right ventricular wall thickness. Right ventricular systolic function is low normal. Tricuspid regurgitation signal is inadequate for assessing PA pressure. Left Atrium: Left atrial size was moderately dilated. Right Atrium: Right atrial size was mildly dilated. Pericardium: There is no evidence of pericardial effusion. Mitral Valve: The mitral valve is normal in structure. Mild mitral valve regurgitation. No evidence of mitral valve stenosis. Tricuspid Valve: The tricuspid valve is not well visualized. Tricuspid valve regurgitation is trivial. No evidence of tricuspid stenosis. Aortic Valve: The aortic valve is tricuspid. Aortic valve regurgitation is not visualized. No aortic stenosis is present. Pulmonic Valve: The pulmonic valve was normal in structure. Pulmonic valve regurgitation is trivial. No evidence of pulmonic stenosis. Aorta: The aortic root and ascending aorta are structurally normal, with no evidence of dilitation. Venous: The inferior vena cava is normal in size with less than 50% respiratory variability, suggesting right atrial pressure of 8 mmHg. IAS/Shunts: No atrial level shunt detected by color flow Doppler.  LEFT VENTRICLE PLAX 2D LVIDd:         4.60 cm LVIDs:         3.70 cm LV PW:         1.80 cm LV IVS:        1.20 cm LVOT diam:     2.10 cm LV SV:         72 LV SV Index:   37 LVOT Area:     3.46 cm  LV Volumes (MOD) LV vol d, MOD A2C:  130.0 ml LV vol d, MOD A4C: 99.7 ml LV vol s, MOD A2C: 70.9 ml LV vol s, MOD A4C: 54.7 ml LV SV MOD A2C:     59.1 ml LV SV MOD A4C:     99.7 ml LV SV MOD BP:      56.0 ml RIGHT VENTRICLE             IVC RV S prime:     10.20 cm/s  IVC diam: 2.00 cm RVOT diam:      2.70 cm TAPSE (M-mode): 1.8 cm LEFT ATRIUM  Index        RIGHT ATRIUM           Index LA diam:        3.40 cm  1.74 cm/m   RA Area:     21.90 cm LA Vol (A2C):   103.0 ml 52.76 ml/m  RA Volume:   64.70 ml  33.14 ml/m LA Vol (A4C):   86.4 ml  44.26 ml/m LA Biplane Vol: 96.8 ml  49.58 ml/m  AORTIC VALVE LVOT Vmax:   134.00 cm/s LVOT Vmean:  84.000 cm/s LVOT VTI:    0.208 m  AORTA Ao Root diam: 2.90 cm Ao Asc diam:  3.30 cm MITRAL VALVE MV Area (PHT): 4.89 cm     SHUNTS MV Decel Time: 155 msec     Systemic VTI:  0.21 m MV E velocity: 122.00 cm/s  Systemic Diam: 2.10 cm                             Pulmonic Diam: 2.70 cm Vishnu Priya Mallipeddi Electronically signed by Winfield Rast Mallipeddi Signature Date/Time: 12/14/2022/12:56:15 PM    Final    DG Chest 2 View  Result Date: 12/13/2022 CLINICAL DATA:  Chest pain EXAM: CHEST - 2 VIEW COMPARISON:  None Available. FINDINGS: Enlarged cardiac silhouette. Prominent RIGHT pulmonary artery similar to prior. Low lung volumes. No effusion, infiltrate or pneumothorax. Degenerative osteophytosis of the spine. IMPRESSION: 1. No acute cardiopulmonary process. 2. Cardiomegaly. Electronically Signed   By: Genevive Bi M.D.   On: 12/13/2022 12:34    Micro Results   *** No results found for this or any previous visit (from the past 240 hour(s)).  Today   Subjective    Kathleen Vance today has no ***          Patient has been seen and examined prior to discharge   Objective   Blood pressure 104/68, pulse 71, temperature 98.3 F (36.8 C), temperature source Oral, resp. rate 16, height 5\' 4"  (1.626 m), weight 89.4 kg, SpO2 95%.   Intake/Output Summary (Last 24 hours) at 12/15/2022  1240 Last data filed at 12/15/2022 1100 Gross per 24 hour  Intake 600 ml  Output --  Net 600 ml    Exam Gen:- Awake Alert, no acute distress *** HEENT:- [place].AT, No sclera icterus Neck-Supple Neck,No JVD,.  Lungs-  CTAB , good air movement bilaterally CV- S1, S2 normal, regular Abd-  +ve B.Sounds, Abd Soft, No tenderness,    Extremity/Skin:- No  edema,   good pulses Psych-affect is appropriate, oriented x3 Neuro-no new focal deficits, no tremors ***   Data Review   CBC w Diff:  Lab Results  Component Value Date   WBC 10.6 (H) 12/15/2022   HGB 10.9 (L) 12/15/2022   HCT 35.0 (L) 12/15/2022   PLT 218 12/15/2022   LYMPHOPCT 16 08/18/2018   MONOPCT 7 08/18/2018   EOSPCT 2 08/18/2018   BASOPCT 0 08/18/2018    CMP:  Lab Results  Component Value Date   NA 136 12/15/2022   K 3.8 12/15/2022   CL 104 12/15/2022   CO2 22 12/15/2022   BUN 46 (H) 12/15/2022   CREATININE 2.55 (H) 12/15/2022   PROT 7.1 08/18/2018   ALBUMIN 3.4 (L) 12/15/2022   BILITOT 0.8 08/18/2018   ALKPHOS 47 08/18/2018   AST 20 08/18/2018   ALT 12 08/18/2018  .  Total Discharge time is about 33 minutes    M.D  on 12/15/2022 at 12:40 PM  Go to [email or web address] -  for contact info  [facility] - Office  [phone]

## 2022-12-15 NOTE — Progress Notes (Signed)
Ng Discharge Note  Admit Date:  12/13/2022 Discharge date: 12/15/2022   Jenelle Mages to be D/C'd Home per MD order.  AVS completed. Patient/caregiver able to verbalize understanding.  Discharge Medication: Allergies as of 12/15/2022       Reactions   Bactrim [sulfamethoxazole-trimethoprim]    Nausea, diarrhea   Ciprofloxacin Anaphylaxis   Nitrofurantoin Anaphylaxis   Metronidazole    "sick"   Amoxicillin Rash   Erythromycin Rash        Medication List     STOP taking these medications    labetalol 300 MG tablet Commonly known as: NORMODYNE   predniSONE 10 MG (21) Tbpk tablet Commonly known as: STERAPRED UNI-PAK 21 TAB       TAKE these medications    acetaminophen 325 MG tablet Commonly known as: TYLENOL Take 2 tablets (650 mg total) by mouth every 6 (six) hours as needed for mild pain (or Fever >/= 101).   allopurinol 100 MG tablet Commonly known as: ZYLOPRIM Take 100 mg by mouth daily.   aspirin EC 81 MG tablet Take 1 tablet (81 mg total) by mouth daily with breakfast. Swallow whole. Start taking on: December 16, 2022   escitalopram 10 MG tablet Commonly known as: LEXAPRO Take 1 tablet (10 mg total) by mouth daily. Start taking on: December 16, 2022   furosemide 40 MG tablet Commonly known as: Lasix Take 1 tablet (40 mg total) by mouth daily. What changed:  medication strength how much to take additional instructions   hydrALAZINE 50 MG tablet Commonly known as: APRESOLINE Take 1 tablet (50 mg total) by mouth 3 (three) times daily. What changed:  medication strength how much to take when to take this   HYDROcodone-acetaminophen 10-325 MG tablet Commonly known as: NORCO Take 1 tablet by mouth 4 (four) times daily.   isosorbide mononitrate 30 MG 24 hr tablet Commonly known as: IMDUR Take 1 tablet (30 mg total) by mouth daily. For Heart What changed: additional instructions   levothyroxine 137 MCG tablet Commonly known as: SYNTHROID Take  1 tablet (137 mcg total) by mouth daily before breakfast.   lisinopril 10 MG tablet Commonly known as: ZESTRIL Take 1 tablet (10 mg total) by mouth daily. What changed:  medication strength how much to take   metoprolol succinate 100 MG 24 hr tablet Commonly known as: Toprol XL Take 1 tablet (100 mg total) by mouth daily. Take with or immediately following a meal.   spironolactone 25 MG tablet Commonly known as: ALDACTONE Take 0.5 tablets (12.5 mg total) by mouth every morning. What changed:  when to take this reasons to take this   traZODone 50 MG tablet Commonly known as: DESYREL Take 1 tablet (50 mg total) by mouth at bedtime. Mood and sleep        Discharge Assessment: Vitals:   12/15/22 1110 12/15/22 1119  BP: 108/69 104/68  Pulse: 76 71  Resp: 14 16  Temp: 98.8 F (37.1 C) 98.3 F (36.8 C)  SpO2: 91% 95%   Skin clean, dry and intact without evidence of skin break down, no evidence of skin tears noted. IV catheter discontinued intact. Site without signs and symptoms of complications - no redness or edema noted at insertion site, patient denies c/o pain - only slight tenderness at site.  Dressing with slight pressure applied.  D/c Instructions-Education: Discharge instructions given to patient/family with verbalized understanding. D/c education completed with patient/family including follow up instructions, medication list, d/c activities limitations if indicated, with other  d/c instructions as indicated by MD - patient able to verbalize understanding, all questions fully answered. Patient instructed to return to ED, call 911, or call MD for any changes in condition.  Patient escorted via WC, and D/C home via private auto.  Cristal Ford, LPN 0/98/1191 4:78 PM

## 2022-12-15 NOTE — Care Management Important Message (Signed)
Important Message  Patient Details  Name: Kathleen Vance MRN: [id number] Date of Birth: [birth year]   Medicare Important Message Given:  N/A - LOS <3 / Initial given by admissions     Corey Harold 12/15/2022, 10:13 AM

## 2022-12-15 NOTE — Plan of Care (Signed)

## 2022-12-15 NOTE — Progress Notes (Addendum)
Mobility Specialist Progress Note:    12/15/22 1000  Mobility  Activity Ambulated with assistance in room  Level of Assistance Standby assist, set-up cues, supervision of patient - no hands on  Assistive Device Cane  Distance Ambulated (ft) 20 ft  Range of Motion/Exercises All extremities;Active  Activity Response Tolerated well  Mobility Referral Yes  $Mobility charge 1 Mobility  Mobility Specialist Start Time (ACUTE ONLY) 1000  Mobility Specialist Stop Time (ACUTE ONLY) 1010  Mobility Specialist Time Calculation (min) (ACUTE ONLY) 10 min   Pt was received sitting EOB,  agreeable to mobility session. Required SBA with cane to stand and ambulate. Tolerated well, BP at EOS was 91/65 (75), c/o lightheadedness. Recovered to 101/69 (79), denies dizziness. SpO2 96% on RA. Left pt sitting EOB, RN in room. All needs met.    Lawerance Bach Mobility Specialist Please contact via Special educational needs teacher or  Rehab office at [phone]

## 2022-12-31 DIAGNOSIS — I5042 Chronic combined systolic (congestive) and diastolic (congestive) heart failure: Secondary | ICD-10-CM | POA: Diagnosis not present

## 2022-12-31 DIAGNOSIS — N189 Chronic kidney disease, unspecified: Secondary | ICD-10-CM | POA: Diagnosis not present

## 2022-12-31 DIAGNOSIS — N184 Chronic kidney disease, stage 4 (severe): Secondary | ICD-10-CM | POA: Diagnosis not present

## 2022-12-31 DIAGNOSIS — E611 Iron deficiency: Secondary | ICD-10-CM | POA: Diagnosis not present

## 2022-12-31 DIAGNOSIS — R809 Proteinuria, unspecified: Secondary | ICD-10-CM | POA: Diagnosis not present

## 2023-01-01 DIAGNOSIS — M5137 Other intervertebral disc degeneration, lumbosacral region: Secondary | ICD-10-CM | POA: Diagnosis not present

## 2023-01-01 DIAGNOSIS — I130 Hypertensive heart and chronic kidney disease with heart failure and stage 1 through stage 4 chronic kidney disease, or unspecified chronic kidney disease: Secondary | ICD-10-CM | POA: Diagnosis not present

## 2023-01-01 DIAGNOSIS — M25559 Pain in unspecified hip: Secondary | ICD-10-CM | POA: Diagnosis not present

## 2023-01-01 DIAGNOSIS — E039 Hypothyroidism, unspecified: Secondary | ICD-10-CM | POA: Diagnosis not present

## 2023-01-01 DIAGNOSIS — I10 Essential (primary) hypertension: Secondary | ICD-10-CM | POA: Diagnosis not present

## 2023-01-01 DIAGNOSIS — I5032 Chronic diastolic (congestive) heart failure: Secondary | ICD-10-CM | POA: Diagnosis not present

## 2023-01-01 DIAGNOSIS — I4891 Unspecified atrial fibrillation: Secondary | ICD-10-CM | POA: Diagnosis not present

## 2023-01-01 DIAGNOSIS — N184 Chronic kidney disease, stage 4 (severe): Secondary | ICD-10-CM | POA: Diagnosis not present

## 2023-01-01 DIAGNOSIS — Z85828 Personal history of other malignant neoplasm of skin: Secondary | ICD-10-CM | POA: Diagnosis not present

## 2023-01-01 DIAGNOSIS — G4700 Insomnia, unspecified: Secondary | ICD-10-CM | POA: Diagnosis not present

## 2023-01-06 DIAGNOSIS — M79672 Pain in left foot: Secondary | ICD-10-CM | POA: Diagnosis not present

## 2023-01-06 DIAGNOSIS — L110 Acquired keratosis follicularis: Secondary | ICD-10-CM | POA: Diagnosis not present

## 2023-01-06 DIAGNOSIS — M79671 Pain in right foot: Secondary | ICD-10-CM | POA: Diagnosis not present

## 2023-01-06 DIAGNOSIS — S93331D Other subluxation of right foot, subsequent encounter: Secondary | ICD-10-CM | POA: Diagnosis not present

## 2023-01-06 DIAGNOSIS — I739 Peripheral vascular disease, unspecified: Secondary | ICD-10-CM | POA: Diagnosis not present

## 2023-01-06 DIAGNOSIS — M79675 Pain in left toe(s): Secondary | ICD-10-CM | POA: Diagnosis not present

## 2023-01-06 DIAGNOSIS — M79674 Pain in right toe(s): Secondary | ICD-10-CM | POA: Diagnosis not present

## 2023-01-06 DIAGNOSIS — S93332D Other subluxation of left foot, subsequent encounter: Secondary | ICD-10-CM | POA: Diagnosis not present

## 2023-01-08 DIAGNOSIS — I5042 Chronic combined systolic (congestive) and diastolic (congestive) heart failure: Secondary | ICD-10-CM | POA: Diagnosis not present

## 2023-01-08 DIAGNOSIS — D638 Anemia in other chronic diseases classified elsewhere: Secondary | ICD-10-CM | POA: Diagnosis not present

## 2023-01-08 DIAGNOSIS — N184 Chronic kidney disease, stage 4 (severe): Secondary | ICD-10-CM | POA: Diagnosis not present

## 2023-01-08 DIAGNOSIS — N179 Acute kidney failure, unspecified: Secondary | ICD-10-CM | POA: Diagnosis not present

## 2023-01-09 ENCOUNTER — Other Ambulatory Visit (HOSPITAL_COMMUNITY): Payer: Self-pay | Admitting: Family Medicine

## 2023-01-09 DIAGNOSIS — R222 Localized swelling, mass and lump, trunk: Secondary | ICD-10-CM

## 2023-01-09 DIAGNOSIS — N185 Chronic kidney disease, stage 5: Secondary | ICD-10-CM | POA: Insufficient documentation

## 2023-01-12 ENCOUNTER — Encounter: Payer: Self-pay | Admitting: Internal Medicine

## 2023-01-12 ENCOUNTER — Ambulatory Visit: Payer: Medicare Other | Attending: Internal Medicine | Admitting: Internal Medicine

## 2023-01-12 ENCOUNTER — Other Ambulatory Visit (HOSPITAL_COMMUNITY): Payer: Self-pay | Admitting: Nephrology

## 2023-01-12 ENCOUNTER — Telehealth: Payer: Self-pay

## 2023-01-12 VITALS — BP 118/72 | HR 70 | Ht 64.0 in | Wt 202.8 lb

## 2023-01-12 DIAGNOSIS — I4819 Other persistent atrial fibrillation: Secondary | ICD-10-CM | POA: Diagnosis not present

## 2023-01-12 DIAGNOSIS — I5022 Chronic systolic (congestive) heart failure: Secondary | ICD-10-CM

## 2023-01-12 DIAGNOSIS — I4891 Unspecified atrial fibrillation: Secondary | ICD-10-CM

## 2023-01-12 DIAGNOSIS — N184 Chronic kidney disease, stage 4 (severe): Secondary | ICD-10-CM

## 2023-01-12 DIAGNOSIS — D638 Anemia in other chronic diseases classified elsewhere: Secondary | ICD-10-CM

## 2023-01-12 DIAGNOSIS — R809 Proteinuria, unspecified: Secondary | ICD-10-CM

## 2023-01-12 DIAGNOSIS — G4733 Obstructive sleep apnea (adult) (pediatric): Secondary | ICD-10-CM

## 2023-01-12 MED ORDER — APIXABAN 5 MG PO TABS: 5.0000 mg | ORAL_TABLET | Freq: Two times a day (BID) | ORAL | 3 refills | Status: DC

## 2023-01-12 NOTE — Patient Instructions (Addendum)
Medication Instructions:  Your physician has recommended you make the following change in your medication:  Start taking Eliquis 5 mg twice a day Stop taking Aspirin 81 mg Continue taking all other medications as prescribed   Labwork: None  Testing/Procedures: Your physician has requested that you have a TEE/Cardioversion. During a TEE, sound waves are used to create images of your heart. It provides your doctor with information about the size and shape of your heart and how well your heart's chambers and valves are working. In this test, a transducer is attached to the end of a flexible tube that is guided down you throat and into your esophagus (the tube leading from your mouth to your stomach) to get a more detailed image of your heart. Once the TEE has determined that a blood clot is not present, the cardioversion begins. Electrical Cardioversion uses a jolt of electricity to your heart either through paddles or wired patches attached to your chest. This is a controlled, usually prescheduled, procedure. This procedure is done at the hospital and you are not awake during the procedure. You usually go home the day of the procedure. Please see the instruction sheet given to you today for more information.   Follow-Up: Your physician recommends that you schedule a follow-up appointment in: 6 weeks Any Other Special Instructions Will Be Listed Below (If Applicable).  If you need a refill on your cardiac medications before your next appointment, please call your pharmacy.

## 2023-01-12 NOTE — Progress Notes (Signed)
Cardiology Office Note  Date: 01/12/2023   ID: Kathleen Vance, DOB 13-Nov-1950, MRN [id number]  PCP:  Benita Stabile, MD  Cardiologist:  Marjo Bicker, MD Electrophysiologist:  None   History of Present Illness: Kathleen Vance is a 72 y.o. female known to have persistent A-fib, chronic systolic heart failure, HTN, HLD was referred to cardiology clinic for evaluation of atrial fibrillation.  Patient was recently admitted to [facility] in 8/24 with hypertensive urgency, antihypertensive medications where changed and discharged on a stable regimen.  Today in the clinic her blood pressures were controlled very well.  She reported having fatigue, SOB and palpitations for the last 1 to 2 months.  EKG since 8/24 showed atrial fibrillation.  Denies having angina, orthopnea, PND and leg swelling.  She was diagnosed with OSA more than a decade ago but has been intolerant to CPAP.  Currently does not use CPAP.  Past Medical History:  Diagnosis Date   Aortic stenosis    Arthritis    Celiac artery stenosis (HCC)    Gout    History of kidney stones    Hyperlipidemia    Hypertension    Hypothyroidism    Kidney failure    Prediabetes    Renal artery stenosis (HCC)    Shingles rash     Past Surgical History:  Procedure Laterality Date   ANKLE SURGERY Right    CATARACT EXTRACTION Bilateral    CESAREAN SECTION     CHOLECYSTECTOMY     DILATION AND CURETTAGE OF UTERUS     PARTIAL HYSTERECTOMY     REPAIR KNEE LIGAMENT Right    TUBAL LIGATION      Current Outpatient Medications  Medication Sig Dispense Refill   allopurinol (ZYLOPRIM) 100 MG tablet Take 100 mg by mouth daily.     apixaban (ELIQUIS) 5 MG TABS tablet Take 1 tablet (5 mg total) by mouth 2 (two) times daily. 60 tablet 3   aspirin EC 81 MG tablet Take 1 tablet (81 mg total) by mouth daily with breakfast. Swallow whole. 30 tablet 12   escitalopram (LEXAPRO) 10 MG tablet Take 1 tablet (10 mg total) by mouth  daily. 30 tablet 5   hydrALAZINE (APRESOLINE) 50 MG tablet Take 1 tablet (50 mg total) by mouth 3 (three) times daily. 90 tablet 3   HYDROcodone-acetaminophen (NORCO) 10-325 MG tablet Take 1 tablet by mouth 4 (four) times daily.     isosorbide mononitrate (IMDUR) 30 MG 24 hr tablet Take 1 tablet (30 mg total) by mouth daily. For Heart 30 tablet 5   levothyroxine (SYNTHROID) 137 MCG tablet Take 1 tablet (137 mcg total) by mouth daily before breakfast. 30 tablet 5   lisinopril (ZESTRIL) 2.5 MG tablet Take 2.5 mg by mouth daily.     metoprolol succinate (TOPROL XL) 100 MG 24 hr tablet Take 1 tablet (100 mg total) by mouth daily. Take with or immediately following a meal. 30 tablet 11   spironolactone (ALDACTONE) 25 MG tablet Take 0.5 tablets (12.5 mg total) by mouth every morning. 45 tablet 3   traZODone (DESYREL) 50 MG tablet Take 1 tablet (50 mg total) by mouth at bedtime. Mood and sleep 30 tablet 3   No current facility-administered medications for this visit.   Allergies:  Bactrim [sulfamethoxazole-trimethoprim], Ciprofloxacin, Nitrofurantoin, Metronidazole, Amoxicillin, and Erythromycin   Social History: The patient  reports that she has never smoked. She has never used smokeless tobacco. She reports current alcohol use. She reports  that she does not use drugs.   Family History: The patient's family history includes CAD in her father, maternal grandmother, and mother; Diabetes in her maternal grandfather; Heart attack in her maternal grandmother, mother, and paternal grandfather; Other in her brother and paternal grandmother; Pneumonia in her sister.   ROS:  Please see the history of present illness. Otherwise, complete review of systems is positive for none  All other systems are reviewed and negative.   Physical Exam: VS:  BP 118/72   Pulse 70   Ht 5\' 4"  (1.626 m)   Wt 202 lb 12.8 oz (92 kg)   SpO2 97%   BMI 34.81 kg/m , BMI Body mass index is 34.81 kg/m.  Wt Readings from Last 3  Encounters:  01/12/23 202 lb 12.8 oz (92 kg)  12/15/22 197 lb 1.5 oz (89.4 kg)  08/20/22 204 lb (92.5 kg)    General: Patient appears comfortable at rest. HEENT: Conjunctiva and lids normal, oropharynx clear with moist mucosa. Neck: Supple, no elevated JVP or carotid bruits, no thyromegaly. Lungs: Clear to auscultation, nonlabored breathing at rest. Cardiac: Regular rate and rhythm, no S3 or significant systolic murmur, no pericardial rub. Abdomen: Soft, nontender, no hepatomegaly, bowel sounds present, no guarding or rebound. Extremities: No pitting edema, distal pulses 2+. Skin: Warm and dry. Musculoskeletal: No kyphosis. Neuropsychiatric: Alert and oriented x3, affect grossly appropriate.  Recent Labwork: 12/13/2022: B Natriuretic Peptide 217.0; Magnesium 2.1; TSH 1.078 12/15/2022: BUN 46; Creatinine, Ser 2.55; Hemoglobin 10.9; Platelets 218; Potassium 3.8; Sodium 136  No results found for: "CHOL", "TRIG", "HDL", "CHOLHDL", "VLDL", "LDLCALC", "LDLDIRECT"    Assessment and Plan:  Persistent atrial fibrillation Hypertensive heart disease Chronic systolic heart failure LVEF 45 to 50% OSA not on CPAP CKD stage IV   -I reviewed the EKGs on the EMR that showed atrial fibrillation since 12/2022. Ongoing symptoms of fatigue, SOB and palpitations x 1 to 2 months.  Currently on metoprolol succinate 100 mg once daily which I will continue. She has not started Eliquis yet, will start Eliquis 5 mg twice daily and stop aspirin 81 mg (she is worried about bleeding that happened with ex-wife of her husband but agreed to be on Eliquis currently).  She also benefit from rhythm control strategy due to new onset atrial fibrillation and being symptomatic.  I discussed about performing TEE guided DCCV which she is in agreement with.  She was diagnosed with OSA more than a decade ago but has been intolerant to CPAP.  Echocardiogram showed LVEF 45 to 50% (chronic since 2021) and moderate LA  dilatation.  Informed consent for TEE guided DCCV The risk and benefits of the procedure discussed with the patient.  The risks include, but not limited to, sore throat, chest pain, infection, aspiration pneumonia, cardiac arrest, possibility of PPM implantation etc.  The patient comprehended the stress and agreed to proceed with the procedure.   -Recent [facility] admission for hypertensive urgency, currently has better control blood pressures.  Continue current antihypertensives, metoprolol succinate 100 mg once daily, spironolactone 12.5 mg once daily, lisinopril 2.5 mg once daily, Imdur 30 mg once daily and hydralazine 50 mg 3 times daily.  Continue GDMT for chronic systolic heart failure as stated above.       Medication Adjustments/Labs and Tests Ordered: Current medicines are reviewed at length with the patient today.  Concerns regarding medicines are outlined above.    Disposition:  Follow up  6 weeks  Signed Godwin Tedesco Verne Spurr, MD, 01/12/2023 3:53 PM  [facility] [address] [place], [place], [place] [address]

## 2023-01-12 NOTE — Telephone Encounter (Signed)
Called patient to advise her not to take aspirin while taking Eliquis. Patient verbalized understanding.

## 2023-01-15 DIAGNOSIS — N189 Chronic kidney disease, unspecified: Secondary | ICD-10-CM | POA: Diagnosis not present

## 2023-01-19 ENCOUNTER — Ambulatory Visit (HOSPITAL_COMMUNITY)
Admission: RE | Admit: 2023-01-19 | Discharge: 2023-01-19 | Disposition: A | Discharge status: Home / Self Care | Payer: Medicare Other | Source: Ambulatory Visit | Attending: Nephrology | Admitting: Nephrology

## 2023-01-19 ENCOUNTER — Ambulatory Visit (HOSPITAL_COMMUNITY)
Admission: RE | Admit: 2023-01-19 | Discharge: 2023-01-19 | Disposition: A | Discharge status: Home / Self Care | Payer: Medicare Other | Source: Ambulatory Visit | Attending: Family Medicine | Admitting: Family Medicine

## 2023-01-19 DIAGNOSIS — D638 Anemia in other chronic diseases classified elsewhere: Secondary | ICD-10-CM | POA: Diagnosis not present

## 2023-01-19 DIAGNOSIS — N184 Chronic kidney disease, stage 4 (severe): Secondary | ICD-10-CM | POA: Insufficient documentation

## 2023-01-19 DIAGNOSIS — R809 Proteinuria, unspecified: Secondary | ICD-10-CM | POA: Diagnosis not present

## 2023-01-19 DIAGNOSIS — K429 Umbilical hernia without obstruction or gangrene: Secondary | ICD-10-CM | POA: Diagnosis not present

## 2023-01-19 DIAGNOSIS — N200 Calculus of kidney: Secondary | ICD-10-CM | POA: Diagnosis not present

## 2023-01-19 DIAGNOSIS — K5730 Diverticulosis of large intestine without perforation or abscess without bleeding: Secondary | ICD-10-CM | POA: Diagnosis not present

## 2023-01-19 DIAGNOSIS — R222 Localized swelling, mass and lump, trunk: Secondary | ICD-10-CM | POA: Diagnosis not present

## 2023-01-21 DIAGNOSIS — N184 Chronic kidney disease, stage 4 (severe): Secondary | ICD-10-CM | POA: Diagnosis not present

## 2023-01-21 DIAGNOSIS — I5043 Acute on chronic combined systolic (congestive) and diastolic (congestive) heart failure: Secondary | ICD-10-CM | POA: Diagnosis not present

## 2023-01-21 DIAGNOSIS — N281 Cyst of kidney, acquired: Secondary | ICD-10-CM | POA: Diagnosis not present

## 2023-01-21 DIAGNOSIS — D638 Anemia in other chronic diseases classified elsewhere: Secondary | ICD-10-CM | POA: Diagnosis not present

## 2023-02-05 NOTE — Patient Instructions (Signed)
Kathleen Vance  02/05/2023     @PREFPERIOPPHARMACY @   Your procedure is scheduled on  02/11/2023.   Report to [facility] at  1115  A.M.   Call this number if you have problems the morning of surgery:  [phone]  If you experience any cold or flu symptoms such as cough, fever, chills, shortness of breath, etc. between now and your scheduled surgery, please notify us at the above number.   Remember:  Do not eat or drink after midnight.        DO NOT miss any doses of your eliquis before your procedure.    Take these medicines the morning of surgery with A SIP OF WATER            allopurinol, escitalopram, hydrocodone(if needed), isosorbide, levothyroxine, metoprolol.     Do not wear jewelry, make-up or nail polish, including gel polish,  artificial nails, or any other type of covering on natural nails (fingers and  toes).  Do not wear lotions, powders, or perfumes, or deodorant.  Do not shave 48 hours prior to surgery.  Men may shave face and neck.  Do not bring valuables to the hospital.  [facility] is not responsible for any belongings or valuables.  Contacts, dentures or bridgework may not be worn into surgery.  Leave your suitcase in the car.  After surgery it may be brought to your room.  For patients admitted to the hospital, discharge time will be determined by your treatment team.  Patients discharged the day of surgery will not be allowed to drive home and must have someone with them for 24 hours.    Special instructions:   DO NOT smoke tobacco or vape for 24 hours before your procedure.  Please read over the following fact sheets that you were given. Anesthesia Post-op Instructions and Care and Recovery After Surgery        Transesophageal Echocardiogram Transesophageal echocardiogram (TEE) is a test that uses sound waves to take pictures of your heart. TEE is done by passing a small probe attached to a flexible tube down the part of the  body that moves food from your mouth to your stomach (esophagus). The pictures give clear images of your heart. This can help your doctor see if there are problems with your heart. Tell a doctor about: Any allergies you have. All medicines you are taking. This includes vitamins, herbs, eye drops, creams, and over-the-counter medicines. Any problems you or family members have had with anesthetic medicines. Any blood disorders you have. Any surgeries you have had. Any medical conditions you have. Any swallowing problems. Whether you have or have had a blockage in the part of the body that moves food from your mouth to your stomach. Whether you are pregnant or may be pregnant. What are the risks? In general, this is a safe procedure. But, problems may occur, such as: Damage to nearby structures or organs. A tear in the part of the body that moves food from your mouth to your stomach. Irregular heartbeat. Hoarse voice or trouble swallowing. Bleeding. What happens before the procedure? Medicines Ask your doctor about changing or stopping: Your normal medicines. Vitamins, herbs, and supplements. Over-the-counter medicines. Do not take aspirin or ibuprofen unless you are told to. General instructions Follow instructions from your doctor about what you cannot eat or drink. You will take out any dentures or dental retainers. Plan to have a responsible adult take you home from  the hospital or clinic. Plan to have a responsible adult care for you for the time you are told after you leave the hospital or clinic. This is important. What happens during the procedure?  An IV will be put into one of your veins. You may be given: A sedative. This medicine helps you relax. A medicine to numb the back of your throat. This may be sprayed or gargled. Your blood pressure, heart rate, and breathing will be watched. You may be asked to lie on your left side. A bite block will be placed in your mouth.  This keeps you from biting the tube. The tip of the probe will be placed into the back of your mouth. You will be asked to swallow. Your doctor will take pictures of your heart. The probe and bite block will be taken out after the test is done. The procedure may vary among doctors and hospitals. What can I expect after the procedure? You will be monitored until you leave the hospital or clinic. This includes checking your blood pressure, heart rate, breathing rate, and blood oxygen level. Your throat may feel sore and numb. This will get better over time. You will not be allowed to eat or drink until the numbness has gone away. It is common to have a sore throat for a day or two. It is up to you to get the results of your procedure. Ask how to get your results when they are ready. Follow these instructions at home: If you were given a sedative during your procedure, do not drive or use machines until your doctor says that it is safe. Return to your normal activities when your doctor says that it is safe. Keep all follow-up visits. Summary TEE is a test that uses sound waves to take pictures of your heart. You will be given a medicine to help you relax. Do not drive or use machines until your doctor says that it is safe. This information is not intended to replace advice given to you by your health care provider. Make sure you discuss any questions you have with your health care provider. Document Revised: 01/02/2021 Document Reviewed: 12/13/2019 Elsevier Patient Education  2024 [employer] Writer cardioversion is the delivery of a jolt of electricity to restore a normal rhythm to the heart. A rhythm that is too fast or is not regular (arrhythmia) keeps the heart from pumping blood well. There is also another type of cardioversion called a chemical (pharmacologic) cardioversion. This is when your health care provider gives you one or more medicines to bring back  your regular heart rhythm. Electrical cardioversion is done as a scheduled procedure for arrhythmiasthat are not life-threatening. Electrical cardioversion may also be done in an emergency for sudden life-threatening arrhythmias. Tell a health care provider about: Any allergies you have. All medicines you are taking, including vitamins, herbs, eye drops, creams, and over-the-counter medicines. Any problems you or family members have had with sedatives or anesthesia. Any bleeding problems you have. Any surgeries you have had, including a pacemaker, defibrillator, or other implanted device. Any medical conditions you have. Whether you are pregnant or may be pregnant. What are the risks? Your provider will talk with you about risks. These include: Allergic reactions to medicines. Irritation to the skin on your chest or back where the sticky pads (electrodes) or paddles were put during electrical cardioversion. A blood clot that breaks free and travels to other parts of your body, such as your brain.  Return of a worse abnormal heart rhythm that will need to be treated with medicines, a pacemaker, or an implantable cardioverter defibrillator (ICD). What happens before the procedure? Medicines Your provider may give you: Blood-thinning medicines (anticoagulants) so your blood does not clot as easily. If your provider gives you this medicine, you may need to take it for 4 weeks before the procedure. Medicines to help stabilize your heart rate and rhythm. Ask your provider about: Changing or stopping your regular medicines. These include any diabetes medicines or blood thinners you take. Taking medicines such as aspirin and ibuprofen. These medicines can thin your blood. Do not take them unless your provider tells you to. Taking over-the-counter medicines, vitamins, herbs, and supplements. General instructions Follow instructions from your provider about what you may eat and drink. Do not put any  lotions, powders, or ointments on your chest and back for 24 hours before the procedure. They can cause problems with the electrodes or paddles used to deliver electricity to your heart. Do not wear jewelry as this can interfere with delivering electricity to your heart. If you will be going home right after the procedure, plan to have a responsible adult: Take you home from the hospital or clinic. You will not be allowed to drive. Care for you for the time you are told. Tests You may have an exam or testing. This may include: Blood labs. A transesophageal echocardiogram (TEE). What happens during the procedure?     An IV will be inserted into one of your veins. You will be given a sedative. This helps you relax. Electrodes or metal paddles will be placed on your chest. They may be placed in one of these ways: One placed on your right chest, the other on the left ribs. One placed on your chest and the other on your back. An electrical shock will be delivered. The shock briefly stops (resets) your heart rhythm. Your provider will check to see if your heart rhythm is now normal. Some people need only one shock. Some need more to restore a normal heart rhythm. The procedure may vary among providers and hospitals. What happens after the procedure? Your blood pressure, heart rate, breathing rate, and blood oxygen level will be monitored until you leave the hospital or clinic. Your heart rhythm will be watched to make sure it does not change. This information is not intended to replace advice given to you by your health care provider. Make sure you discuss any questions you have with your health care provider. Document Revised: 12/12/2021 Document Reviewed: 12/12/2021 Elsevier Patient Education  2024 Else[employer] Inc. Monitored Anesthesia Care, Care After The following information offers guidance on how to care for yourself after your procedure. Your health care provider may also give you more  specific instructions. If you have problems or questions, contact your health care provider. What can I expect after the procedure? After the procedure, it is common to have: Tiredness. Little or no memory about what happened during or after the procedure. Impaired judgment when it comes to making decisions. Nausea or vomiting. Some trouble with balance. Follow these instructions at home: For the time period you were told by your health care provider:  Rest. Do not participate in activities where you could fall or become injured. Do not drive or use machinery. Do not drink alcohol. Do not take sleeping pills or medicines that cause drowsiness. Do not make important decisions or sign legal documents. Do not take care of children on your own.  Medicines Take over-the-counter and prescription medicines only as told by your health care provider. If you were prescribed antibiotics, take them as told by your health care provider. Do not stop using the antibiotic even if you start to feel better. Eating and drinking Follow instructions from your health care provider about what you may eat and drink. Drink enough fluid to keep your urine pale yellow. If you vomit: Drink clear fluids slowly and in small amounts as you are able. Clear fluids include water, ice chips, low-calorie sports drinks, and fruit juice that has water added to it (diluted fruit juice). Eat light and bland foods in small amounts as you are able. These foods include bananas, applesauce, rice, lean meats, toast, and crackers. General instructions  Have a responsible adult stay with you for the time you are told. It is important to have someone help care for you until you are awake and alert. If you have sleep apnea, surgery and some medicines can increase your risk for breathing problems. Follow instructions from your health care provider about wearing your sleep device: When you are sleeping. This includes during daytime  naps. While taking prescription pain medicines, sleeping medicines, or medicines that make you drowsy. Do not use any products that contain nicotine or tobacco. These products include cigarettes, chewing tobacco, and vaping devices, such as e-cigarettes. If you need help quitting, ask your health care provider. Contact a health care provider if: You feel nauseous or vomit every time you eat or drink. You feel light-headed. You are still sleepy or having trouble with balance after 24 hours. You get a rash. You have a fever. You have redness or swelling around the IV site. Get help right away if: You have trouble breathing. You have new confusion after you get home. These symptoms may be an emergency. Get help right away. Call 911. Do not wait to see if the symptoms will go away. Do not drive yourself to the hospital. This information is not intended to replace advice given to you by your health care provider. Make sure you discuss any questions you have with your health care provider. Document Revised: 09/16/2021 Document Reviewed: 09/16/2021 Elsevier Patient Education  2024 [employer].

## 2023-02-09 ENCOUNTER — Encounter (HOSPITAL_COMMUNITY)
Admission: RE | Admit: 2023-02-09 | Discharge: 2023-02-09 | Disposition: A | Discharge status: Home / Self Care | Payer: Medicare Other | Source: Ambulatory Visit | Attending: Internal Medicine

## 2023-02-09 ENCOUNTER — Encounter (HOSPITAL_COMMUNITY): Payer: Self-pay

## 2023-02-09 VITALS — BP 131/66 | HR 54 | Temp 97.8°F | Resp 18 | Ht 64.0 in | Wt 202.8 lb

## 2023-02-09 DIAGNOSIS — N184 Chronic kidney disease, stage 4 (severe): Secondary | ICD-10-CM | POA: Insufficient documentation

## 2023-02-09 DIAGNOSIS — Z01812 Encounter for preprocedural laboratory examination: Secondary | ICD-10-CM | POA: Insufficient documentation

## 2023-02-09 HISTORY — DX: Atherosclerotic heart disease of native coronary artery without angina pectoris: I25.10

## 2023-02-09 HISTORY — DX: Unspecified atrial fibrillation: I48.91

## 2023-02-09 HISTORY — DX: Chronic obstructive pulmonary disease, unspecified: J44.9

## 2023-02-09 HISTORY — DX: Cardiac arrhythmia, unspecified: I49.9

## 2023-02-09 LAB — BASIC METABOLIC PANEL
Anion gap: 10 (ref 5–15)
BUN: 42 mg/dL — ABNORMAL HIGH (ref 8–23)
CO2: 23 mmol/L (ref 22–32)
Calcium: 8.3 mg/dL — ABNORMAL LOW (ref 8.9–10.3)
Chloride: 104 mmol/L (ref 98–111)
Creatinine, Ser: 3.15 mg/dL — ABNORMAL HIGH (ref 0.44–1.00)
GFR, Estimated: 15 mL/min — ABNORMAL LOW (ref >60)
Glucose, Bld: 106 mg/dL — ABNORMAL HIGH (ref 70–99)
Potassium: 4.1 mmol/L (ref 3.5–5.1)
Sodium: 137 mmol/L (ref 135–145)

## 2023-02-09 LAB — CBC WITH DIFFERENTIAL/PLATELET
Abs Immature Granulocytes: 0.03 10*3/uL (ref 0.00–0.07)
Basophils Absolute: 0 10*3/uL (ref 0.0–0.1)
Basophils Relative: 0 %
Eosinophils Absolute: 0.2 10*3/uL (ref 0.0–0.5)
Eosinophils Relative: 3 %
HCT: 34.3 % — ABNORMAL LOW (ref 36.0–46.0)
Hemoglobin: 10 g/dL — ABNORMAL LOW (ref 12.0–15.0)
Immature Granulocytes: 0 %
Lymphocytes Relative: 21 %
Lymphs Abs: 1.8 10*3/uL (ref 0.7–4.0)
MCH: 29.7 pg (ref 26.0–34.0)
MCHC: 29.2 g/dL — ABNORMAL LOW (ref 30.0–36.0)
MCV: 101.8 fL — ABNORMAL HIGH (ref 80.0–100.0)
Monocytes Absolute: 0.6 10*3/uL (ref 0.1–1.0)
Monocytes Relative: 7 %
Neutro Abs: 6 10*3/uL (ref 1.7–7.7)
Neutrophils Relative %: 69 %
Platelets: 217 10*3/uL (ref 150–400)
RBC: 3.37 MIL/uL — ABNORMAL LOW (ref 3.87–5.11)
RDW: 14.3 % (ref 11.5–15.5)
WBC: 8.6 10*3/uL (ref 4.0–10.5)
nRBC: 0 % (ref 0.0–0.2)

## 2023-02-11 ENCOUNTER — Encounter (HOSPITAL_COMMUNITY)
Admission: RE | Disposition: A | Discharge status: Home / Self Care | Payer: Self-pay | Source: Home / Self Care | Attending: Internal Medicine

## 2023-02-11 ENCOUNTER — Ambulatory Visit (HOSPITAL_COMMUNITY): Payer: Medicare Other | Admitting: Certified Registered"

## 2023-02-11 ENCOUNTER — Encounter (HOSPITAL_COMMUNITY): Payer: Self-pay | Admitting: Internal Medicine

## 2023-02-11 ENCOUNTER — Ambulatory Visit (HOSPITAL_COMMUNITY)
Admission: RE | Admit: 2023-02-11 | Discharge: 2023-02-11 | Disposition: A | Discharge status: Home / Self Care | Payer: Medicare Other | Attending: Internal Medicine | Admitting: Internal Medicine

## 2023-02-11 DIAGNOSIS — I4819 Other persistent atrial fibrillation: Secondary | ICD-10-CM | POA: Diagnosis not present

## 2023-02-11 DIAGNOSIS — Z538 Procedure and treatment not carried out for other reasons: Secondary | ICD-10-CM | POA: Diagnosis not present

## 2023-02-11 SURGERY — CARDIOVERSION
Anesthesia: Monitor Anesthesia Care

## 2023-02-11 MED ORDER — SODIUM CHLORIDE 0.9 % IV SOLN: INTRAVENOUS | Status: DC

## 2023-02-11 MED ORDER — METOPROLOL SUCCINATE ER 50 MG PO TB24: 75.0000 mg | ORAL_TABLET | Freq: Every day | ORAL | 11 refills | Status: DC

## 2023-02-11 NOTE — Progress Notes (Signed)
Connected to monitor. Cardiac rhythm displays sinus brady. EKG done. Result sinus brady with PAC. Dr Jenene Slicker notified. TEE and cardioversion canceled. Will be in to talk with pt. Bernie in ECHO notified of cancellation.

## 2023-02-19 DIAGNOSIS — C44311 Basal cell carcinoma of skin of nose: Secondary | ICD-10-CM | POA: Diagnosis not present

## 2023-02-19 DIAGNOSIS — D485 Neoplasm of uncertain behavior of skin: Secondary | ICD-10-CM | POA: Diagnosis not present

## 2023-02-19 DIAGNOSIS — L538 Other specified erythematous conditions: Secondary | ICD-10-CM | POA: Diagnosis not present

## 2023-02-19 DIAGNOSIS — C44212 Basal cell carcinoma of skin of right ear and external auricular canal: Secondary | ICD-10-CM | POA: Diagnosis not present

## 2023-02-19 DIAGNOSIS — C4401 Basal cell carcinoma of skin of lip: Secondary | ICD-10-CM | POA: Diagnosis not present

## 2023-02-20 DIAGNOSIS — Z0001 Encounter for general adult medical examination with abnormal findings: Secondary | ICD-10-CM | POA: Diagnosis not present

## 2023-02-20 DIAGNOSIS — Z1211 Encounter for screening for malignant neoplasm of colon: Secondary | ICD-10-CM | POA: Diagnosis not present

## 2023-02-20 DIAGNOSIS — E039 Hypothyroidism, unspecified: Secondary | ICD-10-CM | POA: Diagnosis not present

## 2023-02-23 ENCOUNTER — Ambulatory Visit: Payer: Medicare Other | Attending: Internal Medicine | Admitting: Internal Medicine

## 2023-02-23 ENCOUNTER — Encounter: Payer: Self-pay | Admitting: Internal Medicine

## 2023-02-23 VITALS — BP 136/72 | HR 46 | Ht 63.5 in | Wt 193.0 lb

## 2023-02-23 DIAGNOSIS — E875 Hyperkalemia: Secondary | ICD-10-CM | POA: Diagnosis not present

## 2023-02-23 DIAGNOSIS — R5383 Other fatigue: Secondary | ICD-10-CM | POA: Insufficient documentation

## 2023-02-23 DIAGNOSIS — I480 Paroxysmal atrial fibrillation: Secondary | ICD-10-CM | POA: Insufficient documentation

## 2023-02-23 DIAGNOSIS — Z7901 Long term (current) use of anticoagulants: Secondary | ICD-10-CM | POA: Diagnosis not present

## 2023-02-23 MED ORDER — METOPROLOL SUCCINATE ER 50 MG PO TB24: 50.0000 mg | ORAL_TABLET | Freq: Every day | ORAL | 2 refills | Status: DC

## 2023-02-23 NOTE — Progress Notes (Signed)
Cardiology Office Note  Date: 02/23/2023   ID: Kathleen Vance, DOB 1951-02-12, MRN [id number]  PCP:  Benita Stabile, MD  Cardiologist:  Marjo Bicker, MD Electrophysiologist:  None   History of Present Illness: Kathleen Vance is a 72 y.o. female known to have persistent A-fib, chronic systolic heart failure, HTN, HLD is here for follow-up visit.  Patient was recently admitted to [facility] in 8/24 with hypertensive urgency, antihypertensive medications where changed and discharged on a stable regimen.  She also had new onset atrial fibrillation for which she was scheduled for TEE guided DCCV but in the preop area, she was found to be in normal sinus rhythm.  The procedure was canceled.  She is here for follow-up visit. 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 Eliquis was not held for the procedure.  She also has been feeling tired, short of breath and dizzy for the last few weeks (worse compared to before).  I reviewed the labs from [facility] that was performed on 02/20/2023, hemoglobin stable at [facility], serum creatinine improved from 3 ish to 2.5.  However her serum K levels are elevated, 5.7.  On spironolactone currently.  She was diagnosed with OSA in the past, intolerant to CPAP.  Does not use CPAP currently.  Past Medical History:  Diagnosis Date   Aortic stenosis    Arthritis    Atrial fibrillation (HCC)    Celiac artery stenosis (HCC)    COPD (chronic obstructive pulmonary disease) (HCC)    Coronary artery disease    Dysrhythmia    Gout    History of kidney stones    Hyperlipidemia    Hypertension    Hypothyroidism    Kidney failure    Prediabetes    Renal artery stenosis (HCC)    Shingles rash     Past Surgical History:  Procedure Laterality Date   ABDOMINAL HYSTERECTOMY     ANKLE SURGERY Right    BREAST  BIOPSY     CATARACT EXTRACTION Bilateral    CESAREAN SECTION     CHOLECYSTECTOMY     DILATION AND CURETTAGE OF UTERUS     PARTIAL HYSTERECTOMY     REPAIR KNEE LIGAMENT Right    TUBAL LIGATION      Current Outpatient Medications  Medication Sig Dispense Refill   allopurinol (ZYLOPRIM) 100 MG tablet Take 100 mg by mouth daily.     apixaban (ELIQUIS) 5 MG TABS tablet Take 1 tablet (5 mg total) by mouth 2 (two) times daily. 60 tablet 3   escitalopram (LEXAPRO) 10 MG tablet Take 1 tablet (10 mg total) by mouth daily. 30 tablet 5   furosemide (LASIX) 20 MG tablet Take 20 mg by mouth 2 (two) times daily.     hydrALAZINE (APRESOLINE) 50 MG tablet Take 1 tablet (50 mg total) by mouth 3 (three) times daily. 90 tablet 3   HYDROcodone-acetaminophen (NORCO) 10-325 MG tablet Take 1 tablet by mouth 4 (four) times daily as needed for moderate pain.     isosorbide mononitrate (IMDUR) 30 MG 24 hr tablet Take 1 tablet (30 mg total) by mouth daily. For Heart 30 tablet 5   levothyroxine (SYNTHROID) 137 MCG tablet Take 1 tablet (137 mcg total) by mouth daily before breakfast. 30 tablet 5   metoprolol succinate (TOPROL XL) 50 MG  24 hr tablet Take 1.5 tablets (75 mg total) by mouth daily. Take with or immediately following a meal. 30 tablet 11   spironolactone (ALDACTONE) 25 MG tablet Take 0.5 tablets (12.5 mg total) by mouth every morning. 45 tablet 3   traZODone (DESYREL) 50 MG tablet Take 1 tablet (50 mg total) by mouth at bedtime. Mood and sleep 30 tablet 3   No current facility-administered medications for this visit.   Allergies:  Bactrim [sulfamethoxazole-trimethoprim], Ciprofloxacin, Nitrofurantoin, Metronidazole, Amoxicillin, Erythromycin, and Tape   Social History: The patient  reports that she has never smoked. She has never used smokeless tobacco. She reports current alcohol use. She reports that she does not use drugs.   Family History: The patient's family history includes CAD in her father,  maternal grandmother, and mother; Diabetes in her maternal grandfather; Heart attack in her maternal grandmother, mother, and paternal grandfather; Other in her brother and paternal grandmother; Pneumonia in her sister.   ROS:  Please see the history of present illness. Otherwise, complete review of systems is positive for none  All other systems are reviewed and negative.   Physical Exam: VS:  There were no vitals taken for this visit., BMI There is no height or weight on file to calculate BMI.  Wt Readings from Last 3 Encounters:  02/09/23 202 lb 13.2 oz (92 kg)  01/12/23 202 lb 12.8 oz (92 kg)  12/15/22 197 lb 1.5 oz (89.4 kg)    General: Patient appears comfortable at rest. HEENT: Conjunctiva and lids normal, oropharynx clear with moist mucosa. Neck: Supple, no elevated JVP or carotid bruits, no thyromegaly. Lungs: Clear to auscultation, nonlabored breathing at rest. Cardiac: Regular rate and rhythm, no S3 or significant systolic murmur, no pericardial rub. Abdomen: Soft, nontender, no hepatomegaly, bowel sounds present, no guarding or rebound. Extremities: No pitting edema, distal pulses 2+. Skin: Warm and dry. Musculoskeletal: No kyphosis. Neuropsychiatric: Alert and oriented x3, affect grossly appropriate.  Recent Labwork: 12/13/2022: B Natriuretic Peptide 217.0; Magnesium 2.1; TSH 1.078 02/09/2023: BUN 42; Creatinine, Ser 3.15; Hemoglobin 10.0; Platelets 217; Potassium 4.1; Sodium 137  No results found for: "CHOL", "TRIG", "HDL", "CHOLHDL", "VLDL", "LDLCALC", "LDLDIRECT"    Assessment and Plan:  Paroxysmal atrial fibrillation Chronic systolic heart failure LVEF 45 to 50% Hyperkalemia, drug-induced Hypertensive heart disease OSA not on CPAP CKD stage IV   -EKG from 02/12/2023 showed normal sinus rhythm, sinus bradycardia.  Metoprolol succinate dose was decreased from 100 mg to send 5 mg once daily, HR today is 46, will further decrease the dose from 75 mg to 50 mg once  daily.  Continue Eliquis 5 mg twice daily.  I reviewed the CBC, CMP which were remarkable for normal stable hemoglobin around 10, serum creatinine 2.5, serum K 5.7.  Will discontinue spironolactone.  Will also obtain iron panel with ferritin due to chronic fatigue and also recent blood loss.  Echocardiogram showed LVEF 45 to 50% (chronic since 2021) and moderate LA dilatation.  Diagnosed with OSA in the past, intolerant to CPAP. -Recent [facility] admission for hypertensive urgency, currently has better control of blood pressures.  Continue current antihypertensives, metoprolol succinate (dose decreased from 75 mg to 50 mg once daily), Imdur 30 mg once daily, hydralazine 50 mg 3 times daily.  Follows up with nephrology.  Will discontinue spironolactone due to hyperkalemia.  She will benefit from addition of Farxiga or Jardiance (for chronic systolic heart failure), will defer this to nephrology due to CKD stage IV.   I spent a  total duration of 30 minutes reviewing the medications, labs, imaging studies, face-to-face discussion/counseling of her medical condition, pathophysiology, evaluation, management, answering all questions, making changes to her medications, documenting the findings in the note.    Disposition:  Follow up 6 months  Signed Shaquille Murdy Verne Spurr, MD, 02/23/2023 11:22 AM    [facility] [address] [place], [place], [place] [address]

## 2023-02-23 NOTE — Patient Instructions (Addendum)
Medication Instructions:  Your physician has recommended you make the following change in your medication:  Decrease Metoprolol from 75 mg to 50 mg daily Stop taking Spironolactone  Continue taking all other medications as prescribed  Labwork: Iron panel to be completed at [facility]  Testing/Procedures: None  Follow-Up: Your physician recommends that you schedule a follow-up appointment in: 6 months  Any Other Special Instructions Will Be Listed Below (If Applicable). Thank you for choosing [facility]!      If you need a refill on your cardiac medications before your next appointment, please call your pharmacy.

## 2023-02-27 DIAGNOSIS — I10 Essential (primary) hypertension: Secondary | ICD-10-CM | POA: Diagnosis not present

## 2023-02-27 DIAGNOSIS — I130 Hypertensive heart and chronic kidney disease with heart failure and stage 1 through stage 4 chronic kidney disease, or unspecified chronic kidney disease: Secondary | ICD-10-CM | POA: Diagnosis not present

## 2023-02-27 DIAGNOSIS — E782 Mixed hyperlipidemia: Secondary | ICD-10-CM | POA: Diagnosis not present

## 2023-02-27 DIAGNOSIS — E039 Hypothyroidism, unspecified: Secondary | ICD-10-CM | POA: Diagnosis not present

## 2023-02-27 DIAGNOSIS — I5042 Chronic combined systolic (congestive) and diastolic (congestive) heart failure: Secondary | ICD-10-CM | POA: Diagnosis not present

## 2023-02-27 DIAGNOSIS — Z85828 Personal history of other malignant neoplasm of skin: Secondary | ICD-10-CM | POA: Diagnosis not present

## 2023-02-27 DIAGNOSIS — M5136 Other intervertebral disc degeneration, lumbar region with discogenic back pain only: Secondary | ICD-10-CM | POA: Diagnosis not present

## 2023-02-27 DIAGNOSIS — Z23 Encounter for immunization: Secondary | ICD-10-CM | POA: Diagnosis not present

## 2023-02-27 DIAGNOSIS — I4891 Unspecified atrial fibrillation: Secondary | ICD-10-CM | POA: Diagnosis not present

## 2023-02-27 DIAGNOSIS — N184 Chronic kidney disease, stage 4 (severe): Secondary | ICD-10-CM | POA: Diagnosis not present

## 2023-03-12 DIAGNOSIS — I5043 Acute on chronic combined systolic (congestive) and diastolic (congestive) heart failure: Secondary | ICD-10-CM | POA: Diagnosis not present

## 2023-03-12 DIAGNOSIS — N200 Calculus of kidney: Secondary | ICD-10-CM | POA: Diagnosis not present

## 2023-03-12 DIAGNOSIS — N281 Cyst of kidney, acquired: Secondary | ICD-10-CM | POA: Diagnosis not present

## 2023-03-12 DIAGNOSIS — N179 Acute kidney failure, unspecified: Secondary | ICD-10-CM | POA: Diagnosis not present

## 2023-03-12 DIAGNOSIS — N189 Chronic kidney disease, unspecified: Secondary | ICD-10-CM | POA: Diagnosis not present

## 2023-03-17 DIAGNOSIS — C4401 Basal cell carcinoma of skin of lip: Secondary | ICD-10-CM | POA: Diagnosis not present

## 2023-03-18 DIAGNOSIS — C4401 Basal cell carcinoma of skin of lip: Secondary | ICD-10-CM | POA: Diagnosis not present

## 2023-03-18 DIAGNOSIS — C44311 Basal cell carcinoma of skin of nose: Secondary | ICD-10-CM | POA: Diagnosis not present

## 2023-03-18 DIAGNOSIS — C44212 Basal cell carcinoma of skin of right ear and external auricular canal: Secondary | ICD-10-CM | POA: Diagnosis not present

## 2023-03-19 DIAGNOSIS — C4401 Basal cell carcinoma of skin of lip: Secondary | ICD-10-CM | POA: Diagnosis not present

## 2023-03-19 DIAGNOSIS — C44311 Basal cell carcinoma of skin of nose: Secondary | ICD-10-CM | POA: Diagnosis not present

## 2023-03-19 DIAGNOSIS — C44212 Basal cell carcinoma of skin of right ear and external auricular canal: Secondary | ICD-10-CM | POA: Diagnosis not present

## 2023-03-20 DIAGNOSIS — I5042 Chronic combined systolic (congestive) and diastolic (congestive) heart failure: Secondary | ICD-10-CM | POA: Diagnosis not present

## 2023-03-20 DIAGNOSIS — N184 Chronic kidney disease, stage 4 (severe): Secondary | ICD-10-CM | POA: Diagnosis not present

## 2023-03-20 DIAGNOSIS — R809 Proteinuria, unspecified: Secondary | ICD-10-CM | POA: Diagnosis not present

## 2023-03-20 DIAGNOSIS — D638 Anemia in other chronic diseases classified elsewhere: Secondary | ICD-10-CM | POA: Diagnosis not present

## 2023-03-23 DIAGNOSIS — C44212 Basal cell carcinoma of skin of right ear and external auricular canal: Secondary | ICD-10-CM | POA: Diagnosis not present

## 2023-03-23 DIAGNOSIS — C44311 Basal cell carcinoma of skin of nose: Secondary | ICD-10-CM | POA: Diagnosis not present

## 2023-03-23 DIAGNOSIS — C4401 Basal cell carcinoma of skin of lip: Secondary | ICD-10-CM | POA: Diagnosis not present

## 2023-03-24 DIAGNOSIS — M79672 Pain in left foot: Secondary | ICD-10-CM | POA: Diagnosis not present

## 2023-03-24 DIAGNOSIS — C44311 Basal cell carcinoma of skin of nose: Secondary | ICD-10-CM | POA: Diagnosis not present

## 2023-03-24 DIAGNOSIS — S93332D Other subluxation of left foot, subsequent encounter: Secondary | ICD-10-CM | POA: Diagnosis not present

## 2023-03-24 DIAGNOSIS — S93331D Other subluxation of right foot, subsequent encounter: Secondary | ICD-10-CM | POA: Diagnosis not present

## 2023-03-24 DIAGNOSIS — M79675 Pain in left toe(s): Secondary | ICD-10-CM | POA: Diagnosis not present

## 2023-03-24 DIAGNOSIS — M79671 Pain in right foot: Secondary | ICD-10-CM | POA: Diagnosis not present

## 2023-03-24 DIAGNOSIS — M79674 Pain in right toe(s): Secondary | ICD-10-CM | POA: Diagnosis not present

## 2023-03-24 DIAGNOSIS — C44212 Basal cell carcinoma of skin of right ear and external auricular canal: Secondary | ICD-10-CM | POA: Diagnosis not present

## 2023-03-24 DIAGNOSIS — C4401 Basal cell carcinoma of skin of lip: Secondary | ICD-10-CM | POA: Diagnosis not present

## 2023-03-24 DIAGNOSIS — I739 Peripheral vascular disease, unspecified: Secondary | ICD-10-CM | POA: Diagnosis not present

## 2023-03-24 DIAGNOSIS — L110 Acquired keratosis follicularis: Secondary | ICD-10-CM | POA: Diagnosis not present

## 2023-03-26 DIAGNOSIS — C44212 Basal cell carcinoma of skin of right ear and external auricular canal: Secondary | ICD-10-CM | POA: Diagnosis not present

## 2023-03-26 DIAGNOSIS — C4401 Basal cell carcinoma of skin of lip: Secondary | ICD-10-CM | POA: Diagnosis not present

## 2023-03-26 DIAGNOSIS — C44311 Basal cell carcinoma of skin of nose: Secondary | ICD-10-CM | POA: Diagnosis not present

## 2023-03-30 ENCOUNTER — Telehealth: Payer: Self-pay

## 2023-03-30 DIAGNOSIS — C4401 Basal cell carcinoma of skin of lip: Secondary | ICD-10-CM | POA: Diagnosis not present

## 2023-03-30 DIAGNOSIS — C44212 Basal cell carcinoma of skin of right ear and external auricular canal: Secondary | ICD-10-CM | POA: Diagnosis not present

## 2023-03-30 DIAGNOSIS — C44311 Basal cell carcinoma of skin of nose: Secondary | ICD-10-CM | POA: Diagnosis not present

## 2023-03-30 NOTE — Telephone Encounter (Signed)
Patient left message asking if she could get another Prednisone pack  Last office visit: 08/20/22  Last refill 12/04/22  PATIENT USES [facility]

## 2023-03-31 DIAGNOSIS — C44212 Basal cell carcinoma of skin of right ear and external auricular canal: Secondary | ICD-10-CM | POA: Diagnosis not present

## 2023-03-31 DIAGNOSIS — C4401 Basal cell carcinoma of skin of lip: Secondary | ICD-10-CM | POA: Diagnosis not present

## 2023-03-31 DIAGNOSIS — C44311 Basal cell carcinoma of skin of nose: Secondary | ICD-10-CM | POA: Diagnosis not present

## 2023-03-31 MED ORDER — PREDNISONE 10 MG (21) PO TBPK: ORAL_TABLET | ORAL | 0 refills | Status: DC

## 2023-04-01 DIAGNOSIS — C4401 Basal cell carcinoma of skin of lip: Secondary | ICD-10-CM | POA: Diagnosis not present

## 2023-04-01 DIAGNOSIS — C44212 Basal cell carcinoma of skin of right ear and external auricular canal: Secondary | ICD-10-CM | POA: Diagnosis not present

## 2023-04-01 DIAGNOSIS — C44311 Basal cell carcinoma of skin of nose: Secondary | ICD-10-CM | POA: Diagnosis not present

## 2023-04-04 DIAGNOSIS — C44212 Basal cell carcinoma of skin of right ear and external auricular canal: Secondary | ICD-10-CM | POA: Diagnosis not present

## 2023-04-04 DIAGNOSIS — C44311 Basal cell carcinoma of skin of nose: Secondary | ICD-10-CM | POA: Diagnosis not present

## 2023-04-04 DIAGNOSIS — C4401 Basal cell carcinoma of skin of lip: Secondary | ICD-10-CM | POA: Diagnosis not present

## 2023-04-07 DIAGNOSIS — C44212 Basal cell carcinoma of skin of right ear and external auricular canal: Secondary | ICD-10-CM | POA: Diagnosis not present

## 2023-04-07 DIAGNOSIS — C4401 Basal cell carcinoma of skin of lip: Secondary | ICD-10-CM | POA: Diagnosis not present

## 2023-04-07 DIAGNOSIS — C44311 Basal cell carcinoma of skin of nose: Secondary | ICD-10-CM | POA: Diagnosis not present

## 2023-04-08 DIAGNOSIS — C44212 Basal cell carcinoma of skin of right ear and external auricular canal: Secondary | ICD-10-CM | POA: Diagnosis not present

## 2023-04-08 DIAGNOSIS — C4401 Basal cell carcinoma of skin of lip: Secondary | ICD-10-CM | POA: Diagnosis not present

## 2023-04-08 DIAGNOSIS — C44311 Basal cell carcinoma of skin of nose: Secondary | ICD-10-CM | POA: Diagnosis not present

## 2023-04-09 DIAGNOSIS — C44212 Basal cell carcinoma of skin of right ear and external auricular canal: Secondary | ICD-10-CM | POA: Diagnosis not present

## 2023-04-09 DIAGNOSIS — C4401 Basal cell carcinoma of skin of lip: Secondary | ICD-10-CM | POA: Diagnosis not present

## 2023-04-09 DIAGNOSIS — C44311 Basal cell carcinoma of skin of nose: Secondary | ICD-10-CM | POA: Diagnosis not present

## 2023-04-13 DIAGNOSIS — C4401 Basal cell carcinoma of skin of lip: Secondary | ICD-10-CM | POA: Diagnosis not present

## 2023-04-13 DIAGNOSIS — C44212 Basal cell carcinoma of skin of right ear and external auricular canal: Secondary | ICD-10-CM | POA: Diagnosis not present

## 2023-04-13 DIAGNOSIS — C44311 Basal cell carcinoma of skin of nose: Secondary | ICD-10-CM | POA: Diagnosis not present

## 2023-04-14 DIAGNOSIS — C44212 Basal cell carcinoma of skin of right ear and external auricular canal: Secondary | ICD-10-CM | POA: Diagnosis not present

## 2023-04-14 DIAGNOSIS — C44311 Basal cell carcinoma of skin of nose: Secondary | ICD-10-CM | POA: Diagnosis not present

## 2023-04-14 DIAGNOSIS — C4401 Basal cell carcinoma of skin of lip: Secondary | ICD-10-CM | POA: Diagnosis not present

## 2023-04-15 DIAGNOSIS — C44311 Basal cell carcinoma of skin of nose: Secondary | ICD-10-CM | POA: Diagnosis not present

## 2023-04-15 DIAGNOSIS — C44212 Basal cell carcinoma of skin of right ear and external auricular canal: Secondary | ICD-10-CM | POA: Diagnosis not present

## 2023-04-15 DIAGNOSIS — C4401 Basal cell carcinoma of skin of lip: Secondary | ICD-10-CM | POA: Diagnosis not present

## 2023-04-18 DIAGNOSIS — C4401 Basal cell carcinoma of skin of lip: Secondary | ICD-10-CM | POA: Diagnosis not present

## 2023-04-18 DIAGNOSIS — C44212 Basal cell carcinoma of skin of right ear and external auricular canal: Secondary | ICD-10-CM | POA: Diagnosis not present

## 2023-04-18 DIAGNOSIS — C44311 Basal cell carcinoma of skin of nose: Secondary | ICD-10-CM | POA: Diagnosis not present

## 2023-04-21 DIAGNOSIS — C4401 Basal cell carcinoma of skin of lip: Secondary | ICD-10-CM | POA: Diagnosis not present

## 2023-04-21 DIAGNOSIS — C44311 Basal cell carcinoma of skin of nose: Secondary | ICD-10-CM | POA: Diagnosis not present

## 2023-04-21 DIAGNOSIS — C44212 Basal cell carcinoma of skin of right ear and external auricular canal: Secondary | ICD-10-CM | POA: Diagnosis not present

## 2023-04-22 DIAGNOSIS — C44212 Basal cell carcinoma of skin of right ear and external auricular canal: Secondary | ICD-10-CM | POA: Diagnosis not present

## 2023-04-22 DIAGNOSIS — C44311 Basal cell carcinoma of skin of nose: Secondary | ICD-10-CM | POA: Diagnosis not present

## 2023-04-22 DIAGNOSIS — C4401 Basal cell carcinoma of skin of lip: Secondary | ICD-10-CM | POA: Diagnosis not present

## 2023-04-23 DIAGNOSIS — C4401 Basal cell carcinoma of skin of lip: Secondary | ICD-10-CM | POA: Diagnosis not present

## 2023-04-23 DIAGNOSIS — C44311 Basal cell carcinoma of skin of nose: Secondary | ICD-10-CM | POA: Diagnosis not present

## 2023-04-23 DIAGNOSIS — C44212 Basal cell carcinoma of skin of right ear and external auricular canal: Secondary | ICD-10-CM | POA: Diagnosis not present

## 2023-04-27 ENCOUNTER — Other Ambulatory Visit: Payer: Self-pay | Admitting: Internal Medicine

## 2023-04-27 DIAGNOSIS — I480 Paroxysmal atrial fibrillation: Secondary | ICD-10-CM

## 2023-04-28 NOTE — Telephone Encounter (Signed)
Eliquis 5mg  refill request received. Patient is 72 years old, weight-87.5kg, Crea-3.15 on 02/09/23, Diagnosis-Afib, and last seen by Dr. Jenene Slicker on 02/23/23. Dose is appropriate based on dosing criteria. Will send in refill to requested pharmacy.

## 2023-05-03 DIAGNOSIS — R110 Nausea: Secondary | ICD-10-CM | POA: Diagnosis not present

## 2023-05-03 DIAGNOSIS — Z20822 Contact with and (suspected) exposure to covid-19: Secondary | ICD-10-CM | POA: Diagnosis not present

## 2023-05-03 DIAGNOSIS — I160 Hypertensive urgency: Secondary | ICD-10-CM | POA: Diagnosis not present

## 2023-05-03 DIAGNOSIS — R0989 Other specified symptoms and signs involving the circulatory and respiratory systems: Secondary | ICD-10-CM | POA: Diagnosis not present

## 2023-05-03 DIAGNOSIS — R0689 Other abnormalities of breathing: Secondary | ICD-10-CM | POA: Diagnosis not present

## 2023-05-03 DIAGNOSIS — Z923 Personal history of irradiation: Secondary | ICD-10-CM | POA: Diagnosis not present

## 2023-05-03 DIAGNOSIS — R918 Other nonspecific abnormal finding of lung field: Secondary | ICD-10-CM | POA: Diagnosis not present

## 2023-05-03 DIAGNOSIS — M109 Gout, unspecified: Secondary | ICD-10-CM | POA: Diagnosis not present

## 2023-05-03 DIAGNOSIS — R0902 Hypoxemia: Secondary | ICD-10-CM | POA: Diagnosis not present

## 2023-05-03 DIAGNOSIS — N3000 Acute cystitis without hematuria: Secondary | ICD-10-CM | POA: Diagnosis not present

## 2023-05-03 DIAGNOSIS — Z1152 Encounter for screening for COVID-19: Secondary | ICD-10-CM | POA: Diagnosis not present

## 2023-05-03 DIAGNOSIS — Z91148 Patient's other noncompliance with medication regimen for other reason: Secondary | ICD-10-CM | POA: Diagnosis not present

## 2023-05-03 DIAGNOSIS — Z7901 Long term (current) use of anticoagulants: Secondary | ICD-10-CM | POA: Diagnosis not present

## 2023-05-03 DIAGNOSIS — T45516A Underdosing of anticoagulants, initial encounter: Secondary | ICD-10-CM | POA: Diagnosis not present

## 2023-05-03 DIAGNOSIS — Z792 Long term (current) use of antibiotics: Secondary | ICD-10-CM | POA: Diagnosis not present

## 2023-05-03 DIAGNOSIS — I5033 Acute on chronic diastolic (congestive) heart failure: Secondary | ICD-10-CM | POA: Diagnosis not present

## 2023-05-03 DIAGNOSIS — I130 Hypertensive heart and chronic kidney disease with heart failure and stage 1 through stage 4 chronic kidney disease, or unspecified chronic kidney disease: Secondary | ICD-10-CM | POA: Diagnosis not present

## 2023-05-03 DIAGNOSIS — Z9049 Acquired absence of other specified parts of digestive tract: Secondary | ICD-10-CM | POA: Diagnosis not present

## 2023-05-03 DIAGNOSIS — I110 Hypertensive heart disease with heart failure: Secondary | ICD-10-CM | POA: Diagnosis not present

## 2023-05-03 DIAGNOSIS — G629 Polyneuropathy, unspecified: Secondary | ICD-10-CM | POA: Diagnosis not present

## 2023-05-03 DIAGNOSIS — Z880 Allergy status to penicillin: Secondary | ICD-10-CM | POA: Diagnosis not present

## 2023-05-03 DIAGNOSIS — I4891 Unspecified atrial fibrillation: Secondary | ICD-10-CM | POA: Diagnosis not present

## 2023-05-03 DIAGNOSIS — R000 Tachycardia, unspecified: Secondary | ICD-10-CM | POA: Diagnosis not present

## 2023-05-03 DIAGNOSIS — R6889 Other general symptoms and signs: Secondary | ICD-10-CM | POA: Diagnosis not present

## 2023-05-03 DIAGNOSIS — I509 Heart failure, unspecified: Secondary | ICD-10-CM | POA: Diagnosis not present

## 2023-05-03 DIAGNOSIS — Z79899 Other long term (current) drug therapy: Secondary | ICD-10-CM | POA: Diagnosis not present

## 2023-05-03 DIAGNOSIS — N189 Chronic kidney disease, unspecified: Secondary | ICD-10-CM | POA: Diagnosis not present

## 2023-05-03 DIAGNOSIS — Z9981 Dependence on supplemental oxygen: Secondary | ICD-10-CM | POA: Diagnosis not present

## 2023-05-03 DIAGNOSIS — Z743 Need for continuous supervision: Secondary | ICD-10-CM | POA: Diagnosis not present

## 2023-05-03 DIAGNOSIS — R6520 Severe sepsis without septic shock: Secondary | ICD-10-CM | POA: Diagnosis not present

## 2023-05-03 DIAGNOSIS — Z881 Allergy status to other antibiotic agents status: Secondary | ICD-10-CM | POA: Diagnosis not present

## 2023-05-03 DIAGNOSIS — Z8679 Personal history of other diseases of the circulatory system: Secondary | ICD-10-CM | POA: Diagnosis not present

## 2023-05-03 DIAGNOSIS — A419 Sepsis, unspecified organism: Secondary | ICD-10-CM | POA: Diagnosis not present

## 2023-05-03 DIAGNOSIS — R509 Fever, unspecified: Secondary | ICD-10-CM | POA: Diagnosis not present

## 2023-05-03 DIAGNOSIS — R531 Weakness: Secondary | ICD-10-CM | POA: Diagnosis not present

## 2023-05-03 DIAGNOSIS — J811 Chronic pulmonary edema: Secondary | ICD-10-CM | POA: Diagnosis not present

## 2023-05-07 ENCOUNTER — Telehealth: Payer: Self-pay

## 2023-05-07 ENCOUNTER — Telehealth: Payer: Self-pay | Admitting: Internal Medicine

## 2023-05-07 MED ORDER — RIVAROXABAN 15 MG PO TABS: 15.0000 mg | ORAL_TABLET | Freq: Every day | ORAL | 11 refills | Status: DC

## 2023-05-07 NOTE — Telephone Encounter (Signed)
 Pt c/o medication issue:  1. Name of Medication:   apixaban  (ELIQUIS ) 5 MG TABS tablet   2. How are you currently taking this medication (dosage and times per day)?   Stopped taking  3. Are you having a reaction (difficulty breathing--STAT)?   Nose bleeds that will not stop  4. What is your medication issue?   Patient stated she was in the hospital for lung sepsis and has stopped taking this medication as it is causing her severe nose bleeds.  Patient wants to get alternate medication.

## 2023-05-07 NOTE — Telephone Encounter (Signed)
 Patient with diagnosis of PAF on Eliquis  for anticoagulation. Off of Eliquis  for 3 weeks. Reports she has severe nose bleeds and would like to be on alternative.    CHA2DS2-VASc Score = 5   This indicates a 7.2% annual risk of stroke. The patient's score is based upon: CHF History: 1 HTN History: 1 Diabetes History: 1 Stroke History: 0 Vascular Disease History: 0 Age Score: 1 Gender Score: 1    CrCl 22 mL/min (SrCr 3.08 05/05/2023) given reduce renal function Xarelto  15 mg once daily with the evening meal  would be an appropriate dose

## 2023-05-07 NOTE — Transitions of Care (Post Inpatient/ED Visit) (Signed)
   05/07/2023  Name: Kathleen Vance MRN: [id number] DOB: 1950/07/11  Today's TOC FU Call Status: Today's TOC FU Call Status:: Unsuccessful Call (1st Attempt) Unsuccessful Call (1st Attempt) Date: 05/07/23  Attempted to reach the patient regarding the most recent Inpatient/ED visit.  Follow Up Plan: Additional outreach attempts will be made to reach the patient to complete the Transitions of Care (Post Inpatient/ED visit) call.    Bari Mayans , BSN, RN Care Management Coordinator [place]   [facility] [email or web address] Direct Dial: [phone]

## 2023-05-08 ENCOUNTER — Telehealth: Payer: Self-pay

## 2023-05-08 DIAGNOSIS — I110 Hypertensive heart disease with heart failure: Secondary | ICD-10-CM | POA: Diagnosis not present

## 2023-05-08 DIAGNOSIS — I4891 Unspecified atrial fibrillation: Secondary | ICD-10-CM | POA: Diagnosis not present

## 2023-05-08 DIAGNOSIS — N3000 Acute cystitis without hematuria: Secondary | ICD-10-CM | POA: Diagnosis not present

## 2023-05-08 DIAGNOSIS — I509 Heart failure, unspecified: Secondary | ICD-10-CM | POA: Diagnosis not present

## 2023-05-08 NOTE — Transitions of Care (Post Inpatient/ED Visit) (Signed)
   05/08/2023  Name: Kathleen Vance MRN: [id number] DOB: [birth year]  Today's TOC FU Call Status: Today's TOC FU Call Status:: Unsuccessful Call (2nd Attempt) Unsuccessful Call (1st Attempt) Date: 05/07/23 Unsuccessful Call (2nd Attempt) Date: 05/08/23  Attempted to reach the patient regarding the most recent Inpatient/ED visit.  Follow Up Plan: Additional outreach attempts will be made to reach the patient to complete the Transitions of Care (Post Inpatient/ED visit) call.   Bari Mayans , BSN, RN Care Management Coordinator [place]   [facility] [email or web address] Direct Dial: [phone]

## 2023-05-11 ENCOUNTER — Telehealth: Payer: Self-pay

## 2023-05-11 DIAGNOSIS — M519 Unspecified thoracic, thoracolumbar and lumbosacral intervertebral disc disorder: Secondary | ICD-10-CM | POA: Diagnosis not present

## 2023-05-11 DIAGNOSIS — R0902 Hypoxemia: Secondary | ICD-10-CM | POA: Diagnosis not present

## 2023-05-11 DIAGNOSIS — I130 Hypertensive heart and chronic kidney disease with heart failure and stage 1 through stage 4 chronic kidney disease, or unspecified chronic kidney disease: Secondary | ICD-10-CM | POA: Diagnosis not present

## 2023-05-11 DIAGNOSIS — E039 Hypothyroidism, unspecified: Secondary | ICD-10-CM | POA: Diagnosis not present

## 2023-05-11 DIAGNOSIS — A419 Sepsis, unspecified organism: Secondary | ICD-10-CM | POA: Diagnosis not present

## 2023-05-11 DIAGNOSIS — I5042 Chronic combined systolic (congestive) and diastolic (congestive) heart failure: Secondary | ICD-10-CM | POA: Diagnosis not present

## 2023-05-11 DIAGNOSIS — N184 Chronic kidney disease, stage 4 (severe): Secondary | ICD-10-CM | POA: Diagnosis not present

## 2023-05-11 DIAGNOSIS — I4891 Unspecified atrial fibrillation: Secondary | ICD-10-CM | POA: Diagnosis not present

## 2023-05-11 DIAGNOSIS — N390 Urinary tract infection, site not specified: Secondary | ICD-10-CM | POA: Diagnosis not present

## 2023-05-11 DIAGNOSIS — Z85828 Personal history of other malignant neoplasm of skin: Secondary | ICD-10-CM | POA: Diagnosis not present

## 2023-05-11 NOTE — Transitions of Care (Post Inpatient/ED Visit) (Signed)
   05/11/2023  Name: Kathleen Vance MRN: [id number] DOB: 1951/03/11  Today's TOC FU Call Status: Today's TOC FU Call Status:: Unsuccessful Call (3rd Attempt) Unsuccessful Call (1st Attempt) Date: 05/07/23 Unsuccessful Call (2nd Attempt) Date: 05/08/23 Unsuccessful Call (3rd Attempt) Date: 05/11/23  Attempted to reach the patient regarding the most recent Inpatient/ED visit.  Follow Up Plan: No further outreach attempts will be made at this time. We have been unable to contact the patient.   Bari Mayans , BSN, RN Care Management Coordinator [place]   [facility] [email or web address] Direct Dial: [phone]

## 2023-05-14 DIAGNOSIS — N3000 Acute cystitis without hematuria: Secondary | ICD-10-CM | POA: Diagnosis not present

## 2023-05-14 DIAGNOSIS — I4891 Unspecified atrial fibrillation: Secondary | ICD-10-CM | POA: Diagnosis not present

## 2023-05-14 DIAGNOSIS — I110 Hypertensive heart disease with heart failure: Secondary | ICD-10-CM | POA: Diagnosis not present

## 2023-05-14 DIAGNOSIS — I509 Heart failure, unspecified: Secondary | ICD-10-CM | POA: Diagnosis not present

## 2023-05-18 DIAGNOSIS — N3000 Acute cystitis without hematuria: Secondary | ICD-10-CM | POA: Diagnosis not present

## 2023-05-18 DIAGNOSIS — I509 Heart failure, unspecified: Secondary | ICD-10-CM | POA: Diagnosis not present

## 2023-05-18 DIAGNOSIS — I4891 Unspecified atrial fibrillation: Secondary | ICD-10-CM | POA: Diagnosis not present

## 2023-05-18 DIAGNOSIS — I110 Hypertensive heart disease with heart failure: Secondary | ICD-10-CM | POA: Diagnosis not present

## 2023-05-20 DIAGNOSIS — C44212 Basal cell carcinoma of skin of right ear and external auricular canal: Secondary | ICD-10-CM | POA: Diagnosis not present

## 2023-05-20 DIAGNOSIS — C4401 Basal cell carcinoma of skin of lip: Secondary | ICD-10-CM | POA: Diagnosis not present

## 2023-05-20 DIAGNOSIS — C44311 Basal cell carcinoma of skin of nose: Secondary | ICD-10-CM | POA: Diagnosis not present

## 2023-05-21 DIAGNOSIS — C44212 Basal cell carcinoma of skin of right ear and external auricular canal: Secondary | ICD-10-CM | POA: Diagnosis not present

## 2023-05-21 DIAGNOSIS — C44311 Basal cell carcinoma of skin of nose: Secondary | ICD-10-CM | POA: Diagnosis not present

## 2023-05-21 DIAGNOSIS — C4401 Basal cell carcinoma of skin of lip: Secondary | ICD-10-CM | POA: Diagnosis not present

## 2023-05-21 NOTE — Telephone Encounter (Signed)
Call pt to see how is he doing on Xarelto. Patient reports no nosebleed on Xarelto 15 mg. Tolerates it well. However, the cost is bit high for her. Advised to call insurance and see if she could opt in monthly payment plan for her $2000 deductible.

## 2023-05-25 DIAGNOSIS — C4401 Basal cell carcinoma of skin of lip: Secondary | ICD-10-CM | POA: Diagnosis not present

## 2023-05-25 DIAGNOSIS — C44212 Basal cell carcinoma of skin of right ear and external auricular canal: Secondary | ICD-10-CM | POA: Diagnosis not present

## 2023-05-25 DIAGNOSIS — C44311 Basal cell carcinoma of skin of nose: Secondary | ICD-10-CM | POA: Diagnosis not present

## 2023-05-27 DIAGNOSIS — N3000 Acute cystitis without hematuria: Secondary | ICD-10-CM | POA: Diagnosis not present

## 2023-05-27 DIAGNOSIS — C44311 Basal cell carcinoma of skin of nose: Secondary | ICD-10-CM | POA: Diagnosis not present

## 2023-05-27 DIAGNOSIS — C4401 Basal cell carcinoma of skin of lip: Secondary | ICD-10-CM | POA: Diagnosis not present

## 2023-05-27 DIAGNOSIS — I110 Hypertensive heart disease with heart failure: Secondary | ICD-10-CM | POA: Diagnosis not present

## 2023-05-27 DIAGNOSIS — C44212 Basal cell carcinoma of skin of right ear and external auricular canal: Secondary | ICD-10-CM | POA: Diagnosis not present

## 2023-05-27 DIAGNOSIS — I509 Heart failure, unspecified: Secondary | ICD-10-CM | POA: Diagnosis not present

## 2023-05-27 DIAGNOSIS — I4891 Unspecified atrial fibrillation: Secondary | ICD-10-CM | POA: Diagnosis not present

## 2023-05-31 DIAGNOSIS — C44212 Basal cell carcinoma of skin of right ear and external auricular canal: Secondary | ICD-10-CM | POA: Diagnosis not present

## 2023-05-31 DIAGNOSIS — C44311 Basal cell carcinoma of skin of nose: Secondary | ICD-10-CM | POA: Diagnosis not present

## 2023-05-31 DIAGNOSIS — C4401 Basal cell carcinoma of skin of lip: Secondary | ICD-10-CM | POA: Diagnosis not present

## 2023-06-01 DIAGNOSIS — I110 Hypertensive heart disease with heart failure: Secondary | ICD-10-CM | POA: Diagnosis not present

## 2023-06-01 DIAGNOSIS — I509 Heart failure, unspecified: Secondary | ICD-10-CM | POA: Diagnosis not present

## 2023-06-01 DIAGNOSIS — N3000 Acute cystitis without hematuria: Secondary | ICD-10-CM | POA: Diagnosis not present

## 2023-06-01 DIAGNOSIS — I4891 Unspecified atrial fibrillation: Secondary | ICD-10-CM | POA: Diagnosis not present

## 2023-06-08 DIAGNOSIS — D638 Anemia in other chronic diseases classified elsewhere: Secondary | ICD-10-CM | POA: Diagnosis not present

## 2023-06-08 DIAGNOSIS — N184 Chronic kidney disease, stage 4 (severe): Secondary | ICD-10-CM | POA: Diagnosis not present

## 2023-06-08 DIAGNOSIS — I5042 Chronic combined systolic (congestive) and diastolic (congestive) heart failure: Secondary | ICD-10-CM | POA: Diagnosis not present

## 2023-06-08 DIAGNOSIS — R809 Proteinuria, unspecified: Secondary | ICD-10-CM | POA: Diagnosis not present

## 2023-06-17 DIAGNOSIS — I509 Heart failure, unspecified: Secondary | ICD-10-CM | POA: Diagnosis not present

## 2023-06-17 DIAGNOSIS — I4891 Unspecified atrial fibrillation: Secondary | ICD-10-CM | POA: Diagnosis not present

## 2023-06-17 DIAGNOSIS — N3000 Acute cystitis without hematuria: Secondary | ICD-10-CM | POA: Diagnosis not present

## 2023-06-17 DIAGNOSIS — I110 Hypertensive heart disease with heart failure: Secondary | ICD-10-CM | POA: Diagnosis not present

## 2023-06-18 ENCOUNTER — Telehealth: Payer: Self-pay | Admitting: Orthopedic Surgery

## 2023-06-18 NOTE — Telephone Encounter (Signed)
LVM stating it's been awhile since she's been seen and she would need a reevaluation to get medications. Call back information left to schedule appointment.

## 2023-06-18 NOTE — Telephone Encounter (Signed)
Dr. Dallas Schimke pt - spoke w/the pt, she is requesting a prednisone pack to be sent to [facility].  She stated that she can't hardly get up with her lt hip/back pain.

## 2023-06-24 ENCOUNTER — Ambulatory Visit: Payer: Medicare Other | Admitting: Orthopedic Surgery

## 2023-06-24 DIAGNOSIS — N3000 Acute cystitis without hematuria: Secondary | ICD-10-CM | POA: Diagnosis not present

## 2023-06-24 DIAGNOSIS — I509 Heart failure, unspecified: Secondary | ICD-10-CM | POA: Diagnosis not present

## 2023-06-24 DIAGNOSIS — I110 Hypertensive heart disease with heart failure: Secondary | ICD-10-CM | POA: Diagnosis not present

## 2023-06-24 DIAGNOSIS — I4891 Unspecified atrial fibrillation: Secondary | ICD-10-CM | POA: Diagnosis not present

## 2023-07-01 ENCOUNTER — Ambulatory Visit: Payer: Medicare Other | Admitting: Orthopedic Surgery

## 2023-07-01 ENCOUNTER — Encounter: Payer: Self-pay | Admitting: Orthopedic Surgery

## 2023-07-01 VITALS — BP 142/78 | HR 61 | Ht 63.5 in | Wt 188.0 lb

## 2023-07-01 DIAGNOSIS — G8929 Other chronic pain: Secondary | ICD-10-CM

## 2023-07-01 DIAGNOSIS — M1612 Unilateral primary osteoarthritis, left hip: Secondary | ICD-10-CM

## 2023-07-01 DIAGNOSIS — M5442 Lumbago with sciatica, left side: Secondary | ICD-10-CM | POA: Diagnosis not present

## 2023-07-01 MED ORDER — PREDNISONE 10 MG (21) PO TBPK: ORAL_TABLET | ORAL | 0 refills | Status: DC

## 2023-07-01 NOTE — Progress Notes (Signed)
 Return patient Visit  Assessment: Kathleen Vance is a 73 y.o. female with the following: 1. Chronic left-sided low back pain with left-sided sciatica 2. Arthritis of left hip  Plan: Kathleen Vance continues to have pain in the left lower back, as well as the left buttock.  Pain does radiate to the anterior aspect of the left hip.  She has some numbness and tingling.  Radiographs were reviewed in clinic today.  She is not a good surgical candidate.  We discussed the risks and benefits of prednisone treatment.  She is willing to accept those risks, she states that they have been very helpful in the past.  As such, we will provide her with a prednisone Dosepak.  She will update the clinic if she has any issues.  Follow-up: Return if symptoms worsen or fail to improve.  Subjective:  Chief Complaint  Patient presents with   Back Pain    Still having on and off LBP w/radiation down L leg. Pt states pain has been worse over the past mo.     History of Present Illness: Kathleen Vance is a 73 y.o. female who continues to have left-sided lower back pain.  She has had pain in her lower back for months.  I have seen her previously for this.  She states the prednisone is one of the only things that she can take, that is helpful.  She is not interested in surgery.  She does not think that she would be a candidate for surgery.  Review of Systems: No fevers or chills Occasional numbness or tingling No chest pain No shortness of breath No bowel or bladder dysfunction No GI distress No headaches     Objective: BP (!) 142/78   Pulse 61   Ht 5' 3.5" (1.613 m)   Wt 188 lb (85.3 kg)   BMI 32.78 kg/m   Physical Exam:  General: Elderly female., Alert and oriented., and No acute distress. Gait: Left sided antalgic gait.  Walks with a cane.  Evaluation of the lower back demonstrates no deformity.  She has tenderness to palpation within the left buttock.  Negative straight leg raise  bilaterally.  She has good lower body strength.  Limited internal rotation of the left hip.  No pain in her groin.  No pain in the anterior hip.  She tolerates range of motion of the right hip without pain.    IMAGING: No new imaging obtained today     New Medications:  Meds ordered this encounter  Medications   predniSONE (STERAPRED UNI-PAK 21 TAB) 10 MG (21) TBPK tablet    Sig: 10 mg DS 12 as directed    Dispense:  48 tablet    Refill:  0      Oliver Barre, MD  07/01/2023 1:51 PM

## 2023-07-02 DIAGNOSIS — I509 Heart failure, unspecified: Secondary | ICD-10-CM | POA: Diagnosis not present

## 2023-07-02 DIAGNOSIS — N3000 Acute cystitis without hematuria: Secondary | ICD-10-CM | POA: Diagnosis not present

## 2023-07-02 DIAGNOSIS — I4891 Unspecified atrial fibrillation: Secondary | ICD-10-CM | POA: Diagnosis not present

## 2023-07-02 DIAGNOSIS — I110 Hypertensive heart disease with heart failure: Secondary | ICD-10-CM | POA: Diagnosis not present

## 2023-07-20 DIAGNOSIS — C44212 Basal cell carcinoma of skin of right ear and external auricular canal: Secondary | ICD-10-CM | POA: Diagnosis not present

## 2023-07-20 DIAGNOSIS — C44311 Basal cell carcinoma of skin of nose: Secondary | ICD-10-CM | POA: Diagnosis not present

## 2023-07-20 DIAGNOSIS — C4401 Basal cell carcinoma of skin of lip: Secondary | ICD-10-CM | POA: Diagnosis not present

## 2023-08-21 DIAGNOSIS — N3001 Acute cystitis with hematuria: Secondary | ICD-10-CM | POA: Diagnosis not present

## 2023-08-21 DIAGNOSIS — N186 End stage renal disease: Secondary | ICD-10-CM | POA: Diagnosis not present

## 2023-08-21 DIAGNOSIS — Z79899 Other long term (current) drug therapy: Secondary | ICD-10-CM | POA: Diagnosis not present

## 2023-08-21 DIAGNOSIS — Z880 Allergy status to penicillin: Secondary | ICD-10-CM | POA: Diagnosis not present

## 2023-08-21 DIAGNOSIS — I509 Heart failure, unspecified: Secondary | ICD-10-CM | POA: Diagnosis not present

## 2023-08-21 DIAGNOSIS — Z7901 Long term (current) use of anticoagulants: Secondary | ICD-10-CM | POA: Diagnosis not present

## 2023-08-21 DIAGNOSIS — Z9049 Acquired absence of other specified parts of digestive tract: Secondary | ICD-10-CM | POA: Diagnosis not present

## 2023-08-21 DIAGNOSIS — I4891 Unspecified atrial fibrillation: Secondary | ICD-10-CM | POA: Diagnosis not present

## 2023-08-21 DIAGNOSIS — I132 Hypertensive heart and chronic kidney disease with heart failure and with stage 5 chronic kidney disease, or end stage renal disease: Secondary | ICD-10-CM | POA: Diagnosis not present

## 2023-08-25 ENCOUNTER — Encounter: Payer: Self-pay | Admitting: Orthopedic Surgery

## 2023-08-25 ENCOUNTER — Ambulatory Visit: Admitting: Orthopedic Surgery

## 2023-08-25 DIAGNOSIS — M7062 Trochanteric bursitis, left hip: Secondary | ICD-10-CM

## 2023-08-25 DIAGNOSIS — E039 Hypothyroidism, unspecified: Secondary | ICD-10-CM | POA: Diagnosis not present

## 2023-08-25 DIAGNOSIS — E782 Mixed hyperlipidemia: Secondary | ICD-10-CM | POA: Diagnosis not present

## 2023-08-25 DIAGNOSIS — R7301 Impaired fasting glucose: Secondary | ICD-10-CM | POA: Diagnosis not present

## 2023-08-25 NOTE — Progress Notes (Signed)
 Return patient Visit  Assessment: Kathleen Vance is a 73 y.o. female with the following: Left-sided greater trochanteric bursitis  Plan: Kathleen Vance noted some improvement in the lower back and pain radiating into her left leg with the prednisone .  However, she is started develop a lot of pain in the lateral hip.  She has tenderness to palpation, with radiating pains down the lateral aspect of the leg.  This is consistent with greater trochanteric bursitis.  This was discussed with the patient.  She elected to proceed with an injection.  She will follow-up as needed.  Procedure note injection - Left lateral hip   Verbal consent was obtained to inject the Left lateral hip.  Patient localized the pain. Timeout was completed to confirm the site of injection.  The skin was prepped with alcohol and ethyl chloride was sprayed at the injection site.  A 21-gauge needle was used to inject 40 mg of Depo-Medrol  and 1% lidocaine  (4 cc) into the Left lateral hip, directly over the localized tenderness using a direct lateral approach.  There were no complications. A sterile bandage was applied.   Follow-up: Return if symptoms worsen or fail to improve.  Subjective:  Chief Complaint  Patient presents with   Back Pain    L side into the back off thigh above the knee. State medications helped for 2 wks but the pain came back with a jolting sensation and is worse than before     History of Present Illness: Kathleen Vance is a 73 y.o. female who continues to have pain in the left hip area.  I saw her couple weeks ago, complaining primarily of back pain.  She completed a short course of prednisone .  This improved her symptoms.  At this point, she is having a lot of pain over the lateral hip, radiating distally.   Review of Systems: No fevers or chills Occasional numbness or tingling No chest pain No shortness of breath No bowel or bladder dysfunction No GI distress No  headaches     Objective: There were no vitals taken for this visit.  Physical Exam:  General: Elderly female., Alert and oriented., and No acute distress. Gait: Left sided antalgic gait.  Walks with a walker  Evaluation of the lower back demonstrates no deformity.  She has tenderness to palpation over the greater trochanter laterally.  She has pain in this area with a straight leg raise.  It radiates distally.   IMAGING: No new imaging obtained today     New Medications:  No orders of the defined types were placed in this encounter.     Kathleen Frater, MD  08/25/2023 12:07 PM

## 2023-08-25 NOTE — Patient Instructions (Signed)

## 2023-08-31 ENCOUNTER — Encounter: Payer: Self-pay | Admitting: Internal Medicine

## 2023-08-31 DIAGNOSIS — G4700 Insomnia, unspecified: Secondary | ICD-10-CM | POA: Diagnosis not present

## 2023-08-31 DIAGNOSIS — I4891 Unspecified atrial fibrillation: Secondary | ICD-10-CM | POA: Diagnosis not present

## 2023-08-31 DIAGNOSIS — E039 Hypothyroidism, unspecified: Secondary | ICD-10-CM | POA: Diagnosis not present

## 2023-08-31 DIAGNOSIS — Z85828 Personal history of other malignant neoplasm of skin: Secondary | ICD-10-CM | POA: Diagnosis not present

## 2023-08-31 DIAGNOSIS — I5042 Chronic combined systolic (congestive) and diastolic (congestive) heart failure: Secondary | ICD-10-CM | POA: Diagnosis not present

## 2023-08-31 DIAGNOSIS — N184 Chronic kidney disease, stage 4 (severe): Secondary | ICD-10-CM | POA: Diagnosis not present

## 2023-08-31 DIAGNOSIS — M519 Unspecified thoracic, thoracolumbar and lumbosacral intervertebral disc disorder: Secondary | ICD-10-CM | POA: Diagnosis not present

## 2023-08-31 DIAGNOSIS — M109 Gout, unspecified: Secondary | ICD-10-CM | POA: Diagnosis not present

## 2023-08-31 DIAGNOSIS — I130 Hypertensive heart and chronic kidney disease with heart failure and stage 1 through stage 4 chronic kidney disease, or unspecified chronic kidney disease: Secondary | ICD-10-CM | POA: Diagnosis not present

## 2023-08-31 DIAGNOSIS — N390 Urinary tract infection, site not specified: Secondary | ICD-10-CM | POA: Diagnosis not present

## 2023-08-31 DIAGNOSIS — R0902 Hypoxemia: Secondary | ICD-10-CM | POA: Diagnosis not present

## 2023-10-15 DIAGNOSIS — I10 Essential (primary) hypertension: Secondary | ICD-10-CM | POA: Diagnosis not present

## 2023-10-15 DIAGNOSIS — N184 Chronic kidney disease, stage 4 (severe): Secondary | ICD-10-CM | POA: Diagnosis not present

## 2023-10-15 DIAGNOSIS — N1831 Chronic kidney disease, stage 3a: Secondary | ICD-10-CM | POA: Diagnosis not present

## 2023-10-15 DIAGNOSIS — I5042 Chronic combined systolic (congestive) and diastolic (congestive) heart failure: Secondary | ICD-10-CM | POA: Diagnosis not present

## 2023-10-26 DIAGNOSIS — E782 Mixed hyperlipidemia: Secondary | ICD-10-CM | POA: Diagnosis not present

## 2023-10-26 DIAGNOSIS — I5042 Chronic combined systolic (congestive) and diastolic (congestive) heart failure: Secondary | ICD-10-CM | POA: Diagnosis not present

## 2023-10-26 DIAGNOSIS — I10 Essential (primary) hypertension: Secondary | ICD-10-CM | POA: Diagnosis not present

## 2023-10-29 DIAGNOSIS — B0223 Postherpetic polyneuropathy: Secondary | ICD-10-CM | POA: Diagnosis not present

## 2023-10-29 DIAGNOSIS — E782 Mixed hyperlipidemia: Secondary | ICD-10-CM | POA: Diagnosis not present

## 2023-10-29 DIAGNOSIS — I5042 Chronic combined systolic (congestive) and diastolic (congestive) heart failure: Secondary | ICD-10-CM | POA: Diagnosis not present

## 2023-10-29 DIAGNOSIS — M109 Gout, unspecified: Secondary | ICD-10-CM | POA: Diagnosis not present

## 2023-10-29 DIAGNOSIS — I10 Essential (primary) hypertension: Secondary | ICD-10-CM | POA: Diagnosis not present

## 2023-10-29 DIAGNOSIS — M7062 Trochanteric bursitis, left hip: Secondary | ICD-10-CM | POA: Diagnosis not present

## 2023-10-29 DIAGNOSIS — E039 Hypothyroidism, unspecified: Secondary | ICD-10-CM | POA: Diagnosis not present

## 2023-10-29 DIAGNOSIS — G4700 Insomnia, unspecified: Secondary | ICD-10-CM | POA: Diagnosis not present

## 2023-10-29 DIAGNOSIS — I4891 Unspecified atrial fibrillation: Secondary | ICD-10-CM | POA: Diagnosis not present

## 2023-10-29 DIAGNOSIS — M519 Unspecified thoracic, thoracolumbar and lumbosacral intervertebral disc disorder: Secondary | ICD-10-CM | POA: Diagnosis not present

## 2023-10-29 DIAGNOSIS — N184 Chronic kidney disease, stage 4 (severe): Secondary | ICD-10-CM | POA: Diagnosis not present

## 2023-11-03 DIAGNOSIS — I4891 Unspecified atrial fibrillation: Secondary | ICD-10-CM | POA: Diagnosis not present

## 2023-11-03 DIAGNOSIS — E782 Mixed hyperlipidemia: Secondary | ICD-10-CM | POA: Diagnosis not present

## 2023-11-03 DIAGNOSIS — I10 Essential (primary) hypertension: Secondary | ICD-10-CM | POA: Diagnosis not present

## 2023-11-03 DIAGNOSIS — I5042 Chronic combined systolic (congestive) and diastolic (congestive) heart failure: Secondary | ICD-10-CM | POA: Diagnosis not present

## 2023-11-06 DIAGNOSIS — N184 Chronic kidney disease, stage 4 (severe): Secondary | ICD-10-CM | POA: Diagnosis not present

## 2023-11-06 DIAGNOSIS — D638 Anemia in other chronic diseases classified elsewhere: Secondary | ICD-10-CM | POA: Diagnosis not present

## 2023-11-06 DIAGNOSIS — I5042 Chronic combined systolic (congestive) and diastolic (congestive) heart failure: Secondary | ICD-10-CM | POA: Diagnosis not present

## 2023-11-06 DIAGNOSIS — R809 Proteinuria, unspecified: Secondary | ICD-10-CM | POA: Diagnosis not present

## 2023-11-20 DIAGNOSIS — E782 Mixed hyperlipidemia: Secondary | ICD-10-CM | POA: Diagnosis not present

## 2023-11-20 DIAGNOSIS — M519 Unspecified thoracic, thoracolumbar and lumbosacral intervertebral disc disorder: Secondary | ICD-10-CM | POA: Diagnosis not present

## 2023-11-20 DIAGNOSIS — I5042 Chronic combined systolic (congestive) and diastolic (congestive) heart failure: Secondary | ICD-10-CM | POA: Diagnosis not present

## 2023-11-20 DIAGNOSIS — N184 Chronic kidney disease, stage 4 (severe): Secondary | ICD-10-CM | POA: Diagnosis not present

## 2023-11-20 DIAGNOSIS — I4891 Unspecified atrial fibrillation: Secondary | ICD-10-CM | POA: Diagnosis not present

## 2023-11-20 DIAGNOSIS — M7062 Trochanteric bursitis, left hip: Secondary | ICD-10-CM | POA: Diagnosis not present

## 2023-11-20 DIAGNOSIS — I10 Essential (primary) hypertension: Secondary | ICD-10-CM | POA: Diagnosis not present

## 2023-11-20 DIAGNOSIS — G4700 Insomnia, unspecified: Secondary | ICD-10-CM | POA: Diagnosis not present

## 2023-11-20 DIAGNOSIS — E039 Hypothyroidism, unspecified: Secondary | ICD-10-CM | POA: Diagnosis not present

## 2023-11-20 DIAGNOSIS — B0223 Postherpetic polyneuropathy: Secondary | ICD-10-CM | POA: Diagnosis not present

## 2023-11-20 DIAGNOSIS — M109 Gout, unspecified: Secondary | ICD-10-CM | POA: Diagnosis not present

## 2023-11-27 ENCOUNTER — Other Ambulatory Visit: Payer: Self-pay | Admitting: Internal Medicine

## 2023-12-04 DIAGNOSIS — N185 Chronic kidney disease, stage 5: Secondary | ICD-10-CM | POA: Diagnosis not present

## 2023-12-04 DIAGNOSIS — I10 Essential (primary) hypertension: Secondary | ICD-10-CM | POA: Diagnosis not present

## 2023-12-04 DIAGNOSIS — E782 Mixed hyperlipidemia: Secondary | ICD-10-CM | POA: Diagnosis not present

## 2023-12-10 ENCOUNTER — Ambulatory Visit: Attending: Internal Medicine | Admitting: Internal Medicine

## 2023-12-10 ENCOUNTER — Encounter: Payer: Self-pay | Admitting: Internal Medicine

## 2023-12-10 VITALS — BP 133/74 | HR 53 | Ht 63.5 in | Wt 186.0 lb

## 2023-12-10 DIAGNOSIS — I5022 Chronic systolic (congestive) heart failure: Secondary | ICD-10-CM | POA: Diagnosis not present

## 2023-12-10 DIAGNOSIS — I480 Paroxysmal atrial fibrillation: Secondary | ICD-10-CM | POA: Diagnosis not present

## 2023-12-10 NOTE — Patient Instructions (Addendum)
 Medication Instructions:  Your physician recommends that you continue on your current medications as directed. Please refer to the Current Medication list given to you today.   Labwork: None  Testing/Procedures: Your physician has requested that you have an echocardiogram. Echocardiography is a painless test that uses sound waves to create images of your heart. It provides your doctor with information about the size and shape of your heart and how well your heart's chambers and valves are working. This procedure takes approximately one hour. There are no restrictions for this procedure. Please do NOT wear cologne, perfume, aftershave, or lotions (deodorant is allowed). Please arrive 15 minutes prior to your appointment time.  Please note: We ask at that you not bring children with you during ultrasound (echo/ vascular) testing. Due to room size and safety concerns, children are not allowed in the ultrasound rooms during exams. Our front office staff cannot provide observation of children in our lobby area while testing is being conducted. An adult accompanying a patient to their appointment will only be allowed in the ultrasound room at the discretion of the ultrasound technician under special circumstances. We apologize for any inconvenience.   Follow-Up: Your physician recommends that you schedule a follow-up appointment in: 1 year. You will receive a reminder call in about 8 months reminding you to schedule your appointment. If you don't receive this call, please contact our office.   Any Other Special Instructions Will Be Listed Below (If Applicable).  Thank you for choosing [facility]!      If you need a refill on your cardiac medications before your next appointment, please call your pharmacy.

## 2023-12-10 NOTE — Progress Notes (Signed)
 Cardiology Office Note  Date: 12/10/2023   ID: Kathleen Vance, DOB [birth year], MRN [id number]  PCP:  Shona Norleen PEDLAR, MD  Cardiologist:  Diannah SHAUNNA Maywood, MD Electrophysiologist:  None   History of Present Illness: Kathleen Vance is a 73 y.o. female known to have persistent A-fib, chronic systolic heart failure, HTN, HLD is here for follow-up visit.  Patient was recently admitted to [facility] in 8/24 with hypertensive urgency, antihypertensive medications where changed and discharged on a stable regimen.  She also had new onset atrial fibrillation for which she was scheduled for TEE guided DCCV but in the preop area, she was found to be in normal sinus rhythm.  The procedure was canceled.  She is here for follow-up visit. 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 Eliquis  was not held for the procedure.  She had recent hospitalizations for UTI and also bleeding from her bladder due to which Xarelto  was held.  She has CKD stage IV and elevated serum potassium levels for which she is nephrology is following up with her.  She is here for follow-up visit.  She is doing great, no DOE or angina.  No fatigue, palpitations, dizziness, syncope.    She was diagnosed with OSA in the past, intolerant to CPAP.  Does not use CPAP currently.  Past Medical History:  Diagnosis Date   Aortic stenosis    Arthritis    Atrial fibrillation (HCC)    Celiac artery stenosis (HCC)    COPD (chronic obstructive pulmonary disease) (HCC)    Coronary artery disease    Dysrhythmia    Gout    History of kidney stones    Hyperlipidemia    Hypertension    Hypothyroidism    Kidney failure    Prediabetes    Renal artery stenosis (HCC)    Shingles rash     Past Surgical History:  Procedure Laterality Date   ABDOMINAL HYSTERECTOMY     ANKLE SURGERY  Right    BREAST BIOPSY     CATARACT EXTRACTION Bilateral    CESAREAN SECTION     CHOLECYSTECTOMY     DILATION AND CURETTAGE OF UTERUS     PARTIAL HYSTERECTOMY     REPAIR KNEE LIGAMENT Right    TUBAL LIGATION      Current Outpatient Medications  Medication Sig Dispense Refill   albuterol  (VENTOLIN  HFA) 108 (90 Base) MCG/ACT inhaler INHALE 2 PUFFS EVERY 4 HOURS     allopurinol  (ZYLOPRIM ) 100 MG tablet Take 100 mg by mouth daily.     escitalopram  (LEXAPRO ) 10 MG tablet Take 1 tablet (10 mg total) by mouth daily. 30 tablet 5   furosemide  (LASIX ) 20 MG tablet Take 20 mg by mouth 2 (two) times daily.     hydrALAZINE  (APRESOLINE ) 50 MG tablet Take 1 tablet (50 mg total) by mouth 3 (three) times daily. 90 tablet 3   isosorbide  mononitrate (IMDUR ) 30 MG 24 hr tablet Take 1 tablet (30 mg total) by mouth daily. For Heart 30 tablet 5   levothyroxine  (SYNTHROID ) 137 MCG tablet Take 1 tablet (137 mcg total) by mouth daily before breakfast. 30 tablet 5   metoprolol  succinate (TOPROL -XL) 50 MG 24 hr tablet TAKE 1 TABLET BY MOUTH DAILY. TAKE WITH OR IMMEDIALTELY FOLLOWING A MEAL. 90 tablet 2   traZODone  (DESYREL )  50 MG tablet Take 1 tablet (50 mg total) by mouth at bedtime. Mood and sleep 30 tablet 3   HYDROcodone -acetaminophen  (NORCO) 10-325 MG tablet Take 1 tablet by mouth 4 (four) times daily as needed for moderate pain.     No current facility-administered medications for this visit.   Allergies:  Bactrim [sulfamethoxazole-trimethoprim], Ciprofloxacin, Nitrofurantoin, Metronidazole, Amoxicillin , Erythromycin, and Tape   Social History: The patient  reports that she has never smoked. She has never used smokeless tobacco. She reports current alcohol use. She reports that she does not use drugs.   Family History: The patient's family history includes CAD in her father, maternal grandmother, and mother; Diabetes in her maternal grandfather; Heart attack in her maternal grandmother, mother, and paternal  grandfather; Other in her brother and paternal grandmother; Pneumonia in her sister.   ROS:  Please see the history of present illness. Otherwise, complete review of systems is positive for none  All other systems are reviewed and negative.   Physical Exam: VS:  BP 133/74 (BP Location: Left Arm, Cuff Size: Large)   Pulse (!) 53   Ht 5' 3.5 (1.613 m)   Wt 186 lb (84.4 kg)   SpO2 94%   BMI 32.43 kg/m , BMI Body mass index is 32.43 kg/m.  Wt Readings from Last 3 Encounters:  12/10/23 186 lb (84.4 kg)  07/01/23 188 lb (85.3 kg)  02/23/23 193 lb (87.5 kg)    General: Patient appears comfortable at rest. HEENT: Conjunctiva and lids normal, oropharynx clear with moist mucosa. Neck: Supple, no elevated JVP or carotid bruits, no thyromegaly. Lungs: Clear to auscultation, nonlabored breathing at rest. Cardiac: Regular rate and rhythm, no S3 or significant systolic murmur, no pericardial rub. Abdomen: Soft, nontender, no hepatomegaly, bowel sounds present, no guarding or rebound. Extremities: No pitting edema, distal pulses 2+. Skin: Warm and dry. Musculoskeletal: No kyphosis. Neuropsychiatric: Alert and oriented x3, affect grossly appropriate.  Recent Labwork: 12/13/2022: B Natriuretic Peptide 217.0; Magnesium  2.1; TSH 1.078 02/09/2023: BUN 42; Creatinine, Ser 3.15; Hemoglobin 10.0; Platelets 217; Potassium 4.1; Sodium 137  No results found for: CHOL, TRIG, HDL, CHOLHDL, VLDL, LDLCALC, LDLDIRECT    Assessment and Plan:  Paroxysmal atrial fibrillation Chronic systolic heart failure LVEF 45 to 50% Hyperkalemia, follows with nephrology Hypertensive heart disease, controlled OSA not on CPAP CKD stage IV, follows with nephrology   - EKG today showed NSR, sinus bradycardia with HR 53 bpm.  Asymptomatic.  Continue metoprolol  succinate 50 mg once daily.  Previously tried Eliquis  and Xarelto  but had bleeding issues.  Will continue to hold systemic AC.  She probably has low  substrate of A-fib as she  spontaneously converted to NSR in 2024.  If she has any recurrent A-fib in the future, she will benefit from [facility] device implantation.  She is agreeable to this plan.  Diagnosed with OSA, intolerant to CPAP.  Echocardiogram in 2024 showed LVEF 45 to 50% (chronic since 2021) and moderate LA dilation.  Will update echocardiogram today. - For chronic systolic heart failure, she is compensated.  Continue metoprolol  succinate 50 mg once daily.  Noted candidate for ACE/ARB/Arni/MRA/SGLT2 inhibitors due to CKD stage IV. - Continue p.o. Lasix  20 mg twice daily (management by nephrology), hydralazine  50 mg 3 times daily, Imdur  30 mg once daily.   I spent a total duration of 30 minutes reviewing the medications, labs, imaging studies, face-to-face discussion/counseling of her medical condition, pathophysiology, evaluation, management, answering all questions, making changes to her medications, documenting the findings in  the note.    Disposition:  Follow up 1 year  Signed Mounir Skipper Arleta Maywood, MD, 12/10/2023 10:11 AM    [facility] [address] [place], [place], [place] [address]

## 2023-12-11 DIAGNOSIS — M7062 Trochanteric bursitis, left hip: Secondary | ICD-10-CM | POA: Diagnosis not present

## 2023-12-11 DIAGNOSIS — I5042 Chronic combined systolic (congestive) and diastolic (congestive) heart failure: Secondary | ICD-10-CM | POA: Diagnosis not present

## 2023-12-11 DIAGNOSIS — E039 Hypothyroidism, unspecified: Secondary | ICD-10-CM | POA: Diagnosis not present

## 2023-12-11 DIAGNOSIS — N184 Chronic kidney disease, stage 4 (severe): Secondary | ICD-10-CM | POA: Diagnosis not present

## 2023-12-11 DIAGNOSIS — M109 Gout, unspecified: Secondary | ICD-10-CM | POA: Diagnosis not present

## 2023-12-11 DIAGNOSIS — I4891 Unspecified atrial fibrillation: Secondary | ICD-10-CM | POA: Diagnosis not present

## 2023-12-11 DIAGNOSIS — G4700 Insomnia, unspecified: Secondary | ICD-10-CM | POA: Diagnosis not present

## 2023-12-11 DIAGNOSIS — M519 Unspecified thoracic, thoracolumbar and lumbosacral intervertebral disc disorder: Secondary | ICD-10-CM | POA: Diagnosis not present

## 2023-12-11 DIAGNOSIS — E782 Mixed hyperlipidemia: Secondary | ICD-10-CM | POA: Diagnosis not present

## 2023-12-11 DIAGNOSIS — I10 Essential (primary) hypertension: Secondary | ICD-10-CM | POA: Diagnosis not present

## 2023-12-11 DIAGNOSIS — B0223 Postherpetic polyneuropathy: Secondary | ICD-10-CM | POA: Diagnosis not present

## 2023-12-24 DIAGNOSIS — E782 Mixed hyperlipidemia: Secondary | ICD-10-CM | POA: Diagnosis not present

## 2023-12-24 DIAGNOSIS — E039 Hypothyroidism, unspecified: Secondary | ICD-10-CM | POA: Diagnosis not present

## 2023-12-24 DIAGNOSIS — R7301 Impaired fasting glucose: Secondary | ICD-10-CM | POA: Diagnosis not present

## 2023-12-30 ENCOUNTER — Encounter: Payer: Self-pay | Admitting: Internal Medicine

## 2023-12-30 DIAGNOSIS — D631 Anemia in chronic kidney disease: Secondary | ICD-10-CM | POA: Diagnosis not present

## 2023-12-30 DIAGNOSIS — M519 Unspecified thoracic, thoracolumbar and lumbosacral intervertebral disc disorder: Secondary | ICD-10-CM | POA: Diagnosis not present

## 2023-12-30 DIAGNOSIS — I4891 Unspecified atrial fibrillation: Secondary | ICD-10-CM | POA: Diagnosis not present

## 2023-12-30 DIAGNOSIS — E039 Hypothyroidism, unspecified: Secondary | ICD-10-CM | POA: Diagnosis not present

## 2023-12-30 DIAGNOSIS — I5042 Chronic combined systolic (congestive) and diastolic (congestive) heart failure: Secondary | ICD-10-CM | POA: Diagnosis not present

## 2023-12-30 DIAGNOSIS — E782 Mixed hyperlipidemia: Secondary | ICD-10-CM | POA: Diagnosis not present

## 2023-12-30 DIAGNOSIS — I130 Hypertensive heart and chronic kidney disease with heart failure and stage 1 through stage 4 chronic kidney disease, or unspecified chronic kidney disease: Secondary | ICD-10-CM | POA: Diagnosis not present

## 2023-12-30 DIAGNOSIS — I10 Essential (primary) hypertension: Secondary | ICD-10-CM | POA: Diagnosis not present

## 2023-12-30 DIAGNOSIS — G4733 Obstructive sleep apnea (adult) (pediatric): Secondary | ICD-10-CM | POA: Diagnosis not present

## 2023-12-30 DIAGNOSIS — N184 Chronic kidney disease, stage 4 (severe): Secondary | ICD-10-CM | POA: Diagnosis not present

## 2023-12-31 ENCOUNTER — Ambulatory Visit: Attending: Internal Medicine

## 2023-12-31 DIAGNOSIS — I5022 Chronic systolic (congestive) heart failure: Secondary | ICD-10-CM | POA: Diagnosis not present

## 2023-12-31 DIAGNOSIS — I480 Paroxysmal atrial fibrillation: Secondary | ICD-10-CM | POA: Diagnosis not present

## 2023-12-31 LAB — ECHOCARDIOGRAM COMPLETE
AR max vel: 2.06 cm2
AV Peak grad: 12.1 mmHg
Ao pk vel: 1.74 m/s
Area-P 1/2: 3.81 cm2
Calc EF: 45.1 %
S' Lateral: 4.4 cm
Single Plane A2C EF: 43 %
Single Plane A4C EF: 51.3 %

## 2024-01-04 DIAGNOSIS — E782 Mixed hyperlipidemia: Secondary | ICD-10-CM | POA: Diagnosis not present

## 2024-01-04 DIAGNOSIS — I4891 Unspecified atrial fibrillation: Secondary | ICD-10-CM | POA: Diagnosis not present

## 2024-01-04 DIAGNOSIS — I5042 Chronic combined systolic (congestive) and diastolic (congestive) heart failure: Secondary | ICD-10-CM | POA: Diagnosis not present

## 2024-01-04 DIAGNOSIS — I10 Essential (primary) hypertension: Secondary | ICD-10-CM | POA: Diagnosis not present

## 2024-01-07 ENCOUNTER — Ambulatory Visit: Payer: Self-pay | Admitting: Internal Medicine

## 2024-01-08 DIAGNOSIS — I10 Essential (primary) hypertension: Secondary | ICD-10-CM | POA: Diagnosis not present

## 2024-01-08 DIAGNOSIS — I5042 Chronic combined systolic (congestive) and diastolic (congestive) heart failure: Secondary | ICD-10-CM | POA: Diagnosis not present

## 2024-01-08 DIAGNOSIS — I4891 Unspecified atrial fibrillation: Secondary | ICD-10-CM | POA: Diagnosis not present

## 2024-01-08 DIAGNOSIS — N184 Chronic kidney disease, stage 4 (severe): Secondary | ICD-10-CM | POA: Diagnosis not present

## 2024-01-08 DIAGNOSIS — G4700 Insomnia, unspecified: Secondary | ICD-10-CM | POA: Diagnosis not present

## 2024-01-08 DIAGNOSIS — B0223 Postherpetic polyneuropathy: Secondary | ICD-10-CM | POA: Diagnosis not present

## 2024-01-08 DIAGNOSIS — M109 Gout, unspecified: Secondary | ICD-10-CM | POA: Diagnosis not present

## 2024-01-08 DIAGNOSIS — M7062 Trochanteric bursitis, left hip: Secondary | ICD-10-CM | POA: Diagnosis not present

## 2024-01-08 DIAGNOSIS — E039 Hypothyroidism, unspecified: Secondary | ICD-10-CM | POA: Diagnosis not present

## 2024-01-08 DIAGNOSIS — M519 Unspecified thoracic, thoracolumbar and lumbosacral intervertebral disc disorder: Secondary | ICD-10-CM | POA: Diagnosis not present

## 2024-01-11 NOTE — Telephone Encounter (Signed)
-----   Message from Vishnu P Mallipeddi sent at 01/07/2024  4:38 PM EDT ----- LV function 40 to 45%, normal RV function, severe pulmonary hypertension (PASP 65.1 mmHg), mild to moderate MR, mild AI, CVP 15 mmHg.  Patient is volume overloaded based on echocardiogram findings.   Currently on p.o. Lasix  20 mg twice daily (managed by nephrology due to CKD stage IV). She will need increased dosage of diuretics.  Have patient follow-up with her nephrologist. ----- Message ----- From: Interface, [employer] Sent: 12/31/2023   5:13 PM EDT To: Vishnu P Mallipeddi, MD

## 2024-01-11 NOTE — Telephone Encounter (Signed)
 The patient has been notified of the result and verbalized understanding.  All questions (if any) were answered. Littie CHRISTELLA Croak, CMA 01/11/2024 1:51 PM

## 2024-02-09 DIAGNOSIS — B0223 Postherpetic polyneuropathy: Secondary | ICD-10-CM | POA: Diagnosis not present

## 2024-02-09 DIAGNOSIS — I10 Essential (primary) hypertension: Secondary | ICD-10-CM | POA: Diagnosis not present

## 2024-02-09 DIAGNOSIS — G4700 Insomnia, unspecified: Secondary | ICD-10-CM | POA: Diagnosis not present

## 2024-02-09 DIAGNOSIS — M519 Unspecified thoracic, thoracolumbar and lumbosacral intervertebral disc disorder: Secondary | ICD-10-CM | POA: Diagnosis not present

## 2024-02-09 DIAGNOSIS — E039 Hypothyroidism, unspecified: Secondary | ICD-10-CM | POA: Diagnosis not present

## 2024-02-09 DIAGNOSIS — N184 Chronic kidney disease, stage 4 (severe): Secondary | ICD-10-CM | POA: Diagnosis not present

## 2024-02-09 DIAGNOSIS — I4891 Unspecified atrial fibrillation: Secondary | ICD-10-CM | POA: Diagnosis not present

## 2024-02-09 DIAGNOSIS — E782 Mixed hyperlipidemia: Secondary | ICD-10-CM | POA: Diagnosis not present

## 2024-02-09 DIAGNOSIS — I5042 Chronic combined systolic (congestive) and diastolic (congestive) heart failure: Secondary | ICD-10-CM | POA: Diagnosis not present

## 2024-02-09 DIAGNOSIS — M7062 Trochanteric bursitis, left hip: Secondary | ICD-10-CM | POA: Diagnosis not present

## 2024-02-09 DIAGNOSIS — M109 Gout, unspecified: Secondary | ICD-10-CM | POA: Diagnosis not present

## 2024-02-11 DIAGNOSIS — R809 Proteinuria, unspecified: Secondary | ICD-10-CM | POA: Diagnosis not present

## 2024-02-11 DIAGNOSIS — E211 Secondary hyperparathyroidism, not elsewhere classified: Secondary | ICD-10-CM | POA: Diagnosis not present

## 2024-02-11 DIAGNOSIS — D631 Anemia in chronic kidney disease: Secondary | ICD-10-CM | POA: Diagnosis not present

## 2024-02-11 DIAGNOSIS — N189 Chronic kidney disease, unspecified: Secondary | ICD-10-CM | POA: Diagnosis not present

## 2024-02-18 DIAGNOSIS — N2581 Secondary hyperparathyroidism of renal origin: Secondary | ICD-10-CM | POA: Diagnosis not present

## 2024-02-18 DIAGNOSIS — R809 Proteinuria, unspecified: Secondary | ICD-10-CM | POA: Diagnosis not present

## 2024-02-18 DIAGNOSIS — N184 Chronic kidney disease, stage 4 (severe): Secondary | ICD-10-CM | POA: Diagnosis not present

## 2024-02-18 DIAGNOSIS — D638 Anemia in other chronic diseases classified elsewhere: Secondary | ICD-10-CM | POA: Diagnosis not present
